# Patient Record
Sex: Female | Born: 1955
Health system: Southern US, Community
[De-identification: ages and names within clinical notes are randomized; demographics above are authoritative.]

## PROBLEM LIST (undated history)

## (undated) DIAGNOSIS — R079 Chest pain, unspecified: Secondary | ICD-10-CM

## (undated) DIAGNOSIS — K219 Gastro-esophageal reflux disease without esophagitis: Secondary | ICD-10-CM

## (undated) DIAGNOSIS — F1910 Other psychoactive substance abuse, uncomplicated: Secondary | ICD-10-CM

## (undated) DIAGNOSIS — G4700 Insomnia, unspecified: Secondary | ICD-10-CM

## (undated) DIAGNOSIS — I10 Essential (primary) hypertension: Secondary | ICD-10-CM

## (undated) DIAGNOSIS — C439 Malignant melanoma of skin, unspecified: Secondary | ICD-10-CM

## (undated) DIAGNOSIS — Z87898 Personal history of other specified conditions: Secondary | ICD-10-CM

## (undated) DIAGNOSIS — Z720 Tobacco use: Secondary | ICD-10-CM

## (undated) DIAGNOSIS — E785 Hyperlipidemia, unspecified: Secondary | ICD-10-CM

## (undated) DIAGNOSIS — T7840XA Allergy, unspecified, initial encounter: Secondary | ICD-10-CM

## (undated) DIAGNOSIS — F1011 Alcohol abuse, in remission: Secondary | ICD-10-CM

## (undated) DIAGNOSIS — F419 Anxiety disorder, unspecified: Secondary | ICD-10-CM

## (undated) DIAGNOSIS — M1990 Unspecified osteoarthritis, unspecified site: Secondary | ICD-10-CM

## (undated) DIAGNOSIS — D72829 Elevated white blood cell count, unspecified: Secondary | ICD-10-CM

## (undated) HISTORY — DX: Personal history of other specified conditions: Z87.898

## (undated) HISTORY — DX: Alcohol abuse, in remission: F10.11

## (undated) HISTORY — PX: DENTAL SURGERY: SHX609

## (undated) HISTORY — DX: Chest pain, unspecified: R07.9

## (undated) HISTORY — DX: Other psychoactive substance abuse, uncomplicated: F19.10

## (undated) HISTORY — DX: Hyperlipidemia, unspecified: E78.5

## (undated) HISTORY — DX: Essential (primary) hypertension: I10

## (undated) HISTORY — DX: Insomnia, unspecified: G47.00

## (undated) HISTORY — DX: Anxiety disorder, unspecified: F41.9

## (undated) HISTORY — DX: Tobacco use: Z72.0

## (undated) HISTORY — DX: Elevated white blood cell count, unspecified: D72.829

## (undated) HISTORY — PX: COLONOSCOPY: SHX174

## (undated) HISTORY — DX: Allergy, unspecified, initial encounter: T78.40XA

## (undated) HISTORY — PX: TONSILLECTOMY: SUR1361

## (undated) HISTORY — DX: Malignant melanoma of skin, unspecified: C43.9

---

## 1997-07-11 DIAGNOSIS — C439 Malignant melanoma of skin, unspecified: Secondary | ICD-10-CM

## 1997-07-11 HISTORY — PX: MELANOMA EXCISION: SHX5266

## 1997-07-11 HISTORY — DX: Malignant melanoma of skin, unspecified: C43.9

## 2000-08-15 ENCOUNTER — Ambulatory Visit (HOSPITAL_COMMUNITY): Admission: RE | Admit: 2000-08-15 | Discharge: 2000-08-15 | Payer: Self-pay | Admitting: Obstetrics and Gynecology

## 2000-08-15 ENCOUNTER — Encounter: Payer: Self-pay | Admitting: Obstetrics and Gynecology

## 2002-05-17 ENCOUNTER — Ambulatory Visit (HOSPITAL_COMMUNITY): Admission: RE | Admit: 2002-05-17 | Discharge: 2002-05-17 | Payer: Self-pay | Admitting: Family Medicine

## 2002-05-17 ENCOUNTER — Encounter: Payer: Self-pay | Admitting: Family Medicine

## 2007-07-12 DIAGNOSIS — F1011 Alcohol abuse, in remission: Secondary | ICD-10-CM

## 2007-07-12 DIAGNOSIS — R079 Chest pain, unspecified: Secondary | ICD-10-CM

## 2007-07-12 HISTORY — DX: Alcohol abuse, in remission: F10.11

## 2007-07-12 HISTORY — DX: Chest pain, unspecified: R07.9

## 2007-11-01 ENCOUNTER — Inpatient Hospital Stay (HOSPITAL_COMMUNITY): Admission: EM | Admit: 2007-11-01 | Discharge: 2007-11-02 | Payer: Self-pay | Admitting: Emergency Medicine

## 2007-11-01 ENCOUNTER — Ambulatory Visit: Payer: Self-pay | Admitting: *Deleted

## 2007-12-14 ENCOUNTER — Encounter: Payer: Self-pay | Admitting: Cardiovascular Disease

## 2010-11-23 NOTE — Discharge Summary (Signed)
[name], Sarah Palmer               ACCOUNT NO.:  [id number]   MEDICAL RECORD NO.:  [id number]          PATIENT TYPE:  INP   LOCATION:  4733                         FACILITY:  [facility]   PHYSICIAN:  Manning Charity, MD     DATE OF BIRTH:  1956/04/04   DATE OF ADMISSION:  11/01/2007  DATE OF DISCHARGE:  11/02/2007                               DISCHARGE SUMMARY   DISCHARGE DIAGNOSES:  1. Atypical chest pain, likely secondary to esophagitis or      gastroesophageal reflux disease.  2. Hypertension.  3. Alcohol abuse.  4. History of malignant melanoma on the leg, removed in 1999.   DISCHARGE MEDICATIONS:  1. Protonix 40 mg by mouth daily.  2. Carafate 1 g twice a day.  3. Hydrochlorothiazide 12.5 mg by mouth daily.  4. Lopressor 25 mg twice a day.  5. Ativan 1 mg p.o. every 8 hours, then 1 mg p.o. every 12 hours, then      1 mg for 1 day, and then stop.  6. Multivitamins.   DISPOSITION ON FOLLOWUP:  1. [name] has an appointment scheduled with Dr. Sunnie Nielsen at      [facility] on November 08, 2007, at 3:15 p.m.      She will need on that visit Bmet  to followup electrolytes.  2. She will need also a referral to a cardiologist for a Myoview      stress test.   HISTORY OF PRESENT ILLNESS:  [name] is a 55 year old with past  medical history of hypertension, tobacco and alcohol abuse, presents  with chest pain.  She related that the pain is on middle chest, not  radiating.  It has been present on and off for 2 months, but it got  severe over the last 7 days.  It is 7/10 in intensity.  No pain with  activity.  No associated  nausea, sweating, vomiting, or dyspnea.  She  related she started drinking alcohol 7 days ago, to help relieve the  pain.  She related that she has been drinking 16 ounce of Vodka daily.  Also having palpitations.  She related mild cough.  No fever or nausea.   PHYSICAL EXAMINATION:  VITAL SIGNS:  Temperature 97.2, blood pressure  162/100, pulse 112, respirations 24, and oxygen saturation 97% on room  air.  GENERAL:  She was alert and awake.  EYES:  PERRLA.  NECK:  Supple.  No JVD.  RESPIRATION:  Clear breath sounds.  No crackles.  No wheezing.  CARDIOVASCULAR:  S1 and S2, tachycardic.  GASTROINTESTINAL:  Bowel sounds soft and positive.  No bright red blood  per rectum.   LABORATORY DATA:  Sodium 144, potassium 3.6, chloride 108, bicarb 21,  BUN 11, creatinine 0.66, and glucose 82.  Hemoglobin 14.3, white blood  cell 13.6, and platelets 336.  Anion gap 15.  Bilirubin 44, alkaline  phosphatase 121, AST 27, ALT 24, protein 6.7, and albumin 3.7, and  calcium 8.6.  Alcohol level 154.  Lipase 14.  D-dimer 1.0.  CT of chest,  no PE.  Chest  x-ray, negative.   PROBLEMS:  1. Chest pain.  Ms. Sherrice Creekmore was admitted to rule out acute      coronary syndrome. She was started on aspirin 325mg , metoprolol and      oxigen. Cardiac enzymes x3 negative.  EKG; no significant changes,      only prolonged QT.  Electrolytes were repleted, magnesium and      potassium.  On the second day of hospitalization, she denied chest      pain.  We started her on Protonix and Carafate, and these helped      with the pain.  2. Hypertension.  She was not taking medications, prior to this      admission.  We started her on Lopressor and hydrochlorothiazide for      blood pressure control.  3. Alcohol abuse.  She has a history of alcohol withdrawal with      seizure.  We started her on the CIWA protocol and Ativan taper.  We      sent her home with Ativan taper, also.  She had counseling      regarding cessation of alcohol abuse.   DISCHARGE LABS AND VITALS:  On the day of discharge, [name]  was in good condition.  She denied chest pain or shortness of breath.  Blood pressure 156/100, pulse 76, respirations 18, oxygen saturation 98%  on room air.  She was alert and awake, in no acute distress.  Pulmonary,  clear breath sounds.  Cardiovascular, S1 and S2.  Labs; white blood cells  12, hemoglobin 12.6, and hematocrit 36.  Sodium 141, potassium 4.0,  chloride 106, bicarb 25, BUN 10, and creatinine 0.7.  Blood culture  negative.  Cholesterol 241, HDL 73, and LDL 144.  TSH 1.180.      Hartley Barefoot, MD  Electronically Signed      Manning Charity, MD  Electronically Signed    BR/MEDQ  D:  11/02/2007  T:  11/03/2007  Job:  [id number]

## 2011-04-05 LAB — URINALYSIS, ROUTINE W REFLEX MICROSCOPIC
Bilirubin Urine: NEGATIVE
Glucose, UA: NEGATIVE
Ketones, ur: >80 — AB
Protein, ur: NEGATIVE
pH: 5.5

## 2011-04-05 LAB — LIPASE, BLOOD: Lipase: 14

## 2011-04-05 LAB — BASIC METABOLIC PANEL
BUN: 10
CO2: 25
Chloride: 105
Potassium: 4

## 2011-04-05 LAB — CBC
HCT: 36.5
HCT: 41.1
Hemoglobin: 14.3
MCHC: 34.7
MCV: 82.9
Platelets: 278
Platelets: 336
RBC: 4.38
RBC: 4.96
RDW: 14.3
WBC: 12.9 — ABNORMAL HIGH
WBC: 13.4 — ABNORMAL HIGH

## 2011-04-05 LAB — CK TOTAL AND CKMB (NOT AT ARMC)
CK, MB: 2.6
Relative Index: INVALID
Total CK: 77

## 2011-04-05 LAB — CARDIAC PANEL(CRET KIN+CKTOT+MB+TROPI)
CK, MB: 1.9
CK, MB: 1.9
Relative Index: 1.9
Relative Index: INVALID
Relative Index: INVALID
Total CK: 79
Total CK: 81
Troponin I: 0.02

## 2011-04-05 LAB — CULTURE, BLOOD (ROUTINE X 2)
Culture: NO GROWTH
Culture: NO GROWTH

## 2011-04-05 LAB — URINE DRUGS OF ABUSE SCREEN W ALC, ROUTINE (REF LAB)
Barbiturate Quant, Ur: NEGATIVE
Benzodiazepines.: NEGATIVE
Cocaine Metabolites: NEGATIVE
Creatinine,U: 120.2
Methadone: NEGATIVE
Phencyclidine (PCP): NEGATIVE

## 2011-04-05 LAB — LIPID PANEL
Cholesterol: 241 — ABNORMAL HIGH
LDL Cholesterol: 144 — ABNORMAL HIGH
VLDL: 24

## 2011-04-05 LAB — COMPREHENSIVE METABOLIC PANEL
AST: 27
BUN: 11
CO2: 21
Calcium: 8.6
Creatinine, Ser: 0.66
GFR calc Af Amer: >60
GFR calc non Af Amer: >60
Glucose, Bld: 82
Total Bilirubin: 0.4

## 2011-04-05 LAB — POCT CARDIAC MARKERS
Myoglobin, poc: 52.2
Operator id: 282201

## 2011-04-05 LAB — STREP PNEUMONIAE ANTIBODY SEROTYPES
Strep pneumo Type 19: 0.14 ug/mL
Strep pneumo Type 4: 0.3 ug/mL
Strep pneumo Type 9: 0.08 ug/mL
Strep pneumoniae Type 18C Abs: 0.05 ug/mL
Strep pneumoniae Type 5 Abs: 1.44 ug/mL
Strep pneumoniae Type 6B Abs: 1.25 ug/mL
Strep pneumoniae Type 9N Abs: 0.06 ug/mL

## 2011-04-05 LAB — URINE MICROSCOPIC-ADD ON

## 2011-04-05 LAB — DIFFERENTIAL
Basophils Absolute: 0.1
Lymphocytes Relative: 31
Lymphs Abs: 4.2 — ABNORMAL HIGH
Neutro Abs: 8.1 — ABNORMAL HIGH
Neutrophils Relative %: 61

## 2011-04-05 LAB — TSH: TSH: 1.18

## 2011-04-05 LAB — D-DIMER, QUANTITATIVE: D-Dimer, Quant: 1.08 — ABNORMAL HIGH

## 2011-04-05 LAB — LEGIONELLA ANTIGEN, URINE: Legionella Antigen, Urine: NEGATIVE

## 2011-04-05 LAB — TROPONIN I: Troponin I: 0.01

## 2012-07-06 ENCOUNTER — Encounter: Payer: Self-pay | Admitting: Cardiology

## 2012-07-06 ENCOUNTER — Encounter: Payer: Self-pay | Admitting: *Deleted

## 2012-07-06 ENCOUNTER — Ambulatory Visit (INDEPENDENT_AMBULATORY_CARE_PROVIDER_SITE_OTHER): Payer: BC Managed Care – PPO | Admitting: Cardiology

## 2012-07-06 VITALS — BP 132/90 | HR 59 | Ht 65.5 in | Wt 227.0 lb

## 2012-07-06 DIAGNOSIS — R0789 Other chest pain: Secondary | ICD-10-CM | POA: Insufficient documentation

## 2012-07-06 DIAGNOSIS — Z85820 Personal history of malignant melanoma of skin: Secondary | ICD-10-CM | POA: Insufficient documentation

## 2012-07-06 DIAGNOSIS — I10 Essential (primary) hypertension: Secondary | ICD-10-CM | POA: Insufficient documentation

## 2012-07-06 DIAGNOSIS — R079 Chest pain, unspecified: Secondary | ICD-10-CM

## 2012-07-06 DIAGNOSIS — E785 Hyperlipidemia, unspecified: Secondary | ICD-10-CM

## 2012-07-06 DIAGNOSIS — F1011 Alcohol abuse, in remission: Secondary | ICD-10-CM

## 2012-07-06 DIAGNOSIS — Z720 Tobacco use: Secondary | ICD-10-CM | POA: Insufficient documentation

## 2012-07-06 DIAGNOSIS — F17200 Nicotine dependence, unspecified, uncomplicated: Secondary | ICD-10-CM

## 2012-07-06 DIAGNOSIS — C439 Malignant melanoma of skin, unspecified: Secondary | ICD-10-CM

## 2012-07-06 NOTE — Assessment & Plan Note (Signed)
In the absence of known vascular disease, pharmacologic therapy is not mandatory.

## 2012-07-06 NOTE — Patient Instructions (Signed)
Your physician recommends that you schedule a follow-up appointment in: As needed  Your physician has requested that you have a stress echocardiogram. For further information please visit [email or web address] Please follow instruction sheet as given.  Dash Diet  Increase exercise after testing

## 2012-07-06 NOTE — Assessment & Plan Note (Signed)
Chest discomfort is atypical, but did have the quality of myocardial ischemia.  Stress testing is reasonable to substantially decrease the likelihood of coronary artery disease.  Reading of CT Scan obtained in 2009 did not mention the appearance of the coronaries or aorta.  Study will be reviewed with the radiologist.

## 2012-07-06 NOTE — Assessment & Plan Note (Signed)
Quit attempt initiated in 06/2012.

## 2012-07-06 NOTE — Assessment & Plan Note (Signed)
Blood pressure is well controlled at this visit.

## 2012-07-06 NOTE — Progress Notes (Signed)
Patient ID: Sarah Palmer, female   DOB: 1955/10/27, 56 y.o.   MRN: [id number]  HPI: Cardiology assessment performed at the kind request of Dr. Collins Scotland for evaluation of chest discomfort.  Ms. Levenhagen has a history of hypertension and hyperlipidemia but has had no known vascular disease.  She underwent a cardiology evaluation in 2009 for palpitations associated with dyspnea and chest discomfort.  Echocardiogram, stress test and CT scan of the chest were negative.  She subsequently has done generally well with good control of hypertension until last week when she experienced persistent left upper chest pressure as well as intermittent sharp and brief chest pains associated with a upper respiratory infection.  She had developed cough with scant sputum production and chest congestion a few days earlier.  With resolution of that problem, she has noted no further chest discomfort.  There was no pleuritic component no chest wall tenderness.  She did not appreciate associated dyspnea or diaphoresis.  Recent chest x-ray and EKG reportedly negative.  Cardiology consultation obtained at patient's request.  Current Outpatient Prescriptions on File Prior to Visit  Medication Sig Dispense Refill  . aspirin 81 MG tablet Take 81 mg by mouth daily.      Marland Kitchen atenolol (TENORMIN) 25 MG tablet Take 25 mg by mouth daily.       . Flaxseed, Linseed, (FLAX SEED OIL PO) Take by mouth.      . hydrochlorothiazide (MICROZIDE) 12.5 MG capsule Take 12.5 mg by mouth daily.      Marland Kitchen omega-3 acid ethyl esters (LOVAZA) 1 G capsule Take 1 g by mouth daily.        Allergies  Allergen Reactions  . Penicillins     Past Medical History  Diagnosis Date  . Hyperlipidemia     lipid profile in 2009:241, 122, 73, 144  . Hypertension   . Chest pain 2009    associated with palpitations; negative chest CT in 2009  . Melanoma 1999    resected from lower extremity  . Tobacco abuse   . History of alcohol abuse 2009    Past Surgical History    Procedure Date  . Melanoma excision 1999    lower extremity    Family History  Problem Relation Age of Onset  . Hypertension      History   Social History  . Marital Status: Single    Spouse Name: N/A    Number of Children: N/A  . Years of Education: N/A   Occupational History  . Not on file.   Social History Main Topics  . Smoking status: Former Smoker    Types: Cigarettes    Quit date: 06/29/2012  . Smokeless tobacco: Not on file  . Alcohol Use: No     Comment: excessive alcohol use in the past noted in 2009  . Drug Use: No  . Sexually Active: Not on file   Other Topics Concern  . Not on file   Social History Narrative  . No narrative on file   ROS:  Mild gastroesophageal reflux disease symptoms.  All other systems reviewed and are negative.  PHYSICAL EXAM: BP 132/90  Pulse 59  Ht 5' 5.5" (1.664 m)  Wt 102.967 kg (227 lb)  BMI 37.20 kg/m2  General-Well-developed; no acute distress Body Habitus-substantially overweight HEENT-Drew/AT; PERRL; EOM intact; conjunctiva and lids nl Neck-No JVD; no carotid bruits Endocrine-No thyromegaly Lungs-Clear lung fields; resonant percussion; normal I-to-E ratio Cardiovascular- normal PMI; normal S1 and S2; S4 present Abdomen-BS normal; soft and  non-tender without masses or organomegaly Musculoskeletal-No deformities, cyanosis or clubbing Neurologic-Nl cranial nerves; symmetric strength and tone Skin- Warm, no significant lesions Extremities-Nl distal pulses; no edema  EKG:  Tracing performed 07/02/12 obtained and reviewed: Normal sinus rhythm, within normal limits.   ASSESSMENT AND PLAN:  [place] Bing, MD 07/06/2012 12:28 PM

## 2012-07-06 NOTE — Progress Notes (Deleted)
Name: Sarah Palmer    DOB: [birth year]  Age: 56 y.o.  MR#: [id number]       PCP:  Herb Grays, MD      Insurance: @PAYORNAME @   CC:   No chief complaint on file.  MEDICATION LIST TIGHTNESS AT REST AFTER MEALS NEW ONSET REFLUX  VS BP 132/90  Pulse 59  Ht 5' 5.5" (1.664 m)  Wt 227 lb (102.967 kg)  BMI 37.20 kg/m2  Weights Current Weight  07/06/12 227 lb (102.967 kg)    Blood Pressure  BP Readings from Last 3 Encounters:  07/06/12 132/90     Admit date:  (Not on file) Last encounter with RMR:  Visit date not found   Allergy Allergies  Allergen Reactions  . Penicillins     Current Outpatient Prescriptions  Medication Sig Dispense Refill  . aspirin 81 MG tablet Take 81 mg by mouth daily.      Marland Kitchen atenolol (TENORMIN) 25 MG tablet Take 25 mg by mouth daily.       . Flaxseed, Linseed, (FLAX SEED OIL PO) Take by mouth.      . hydrochlorothiazide (MICROZIDE) 12.5 MG capsule Take 12.5 mg by mouth daily.      Marland Kitchen omega-3 acid ethyl esters (LOVAZA) 1 G capsule Take 1 g by mouth daily.         Discontinued Meds:    Medications Discontinued During This Encounter  Medication Reason  . metoprolol tartrate (LOPRESSOR) 25 MG tablet Completed Course  . sucralfate (CARAFATE) 1 G tablet Completed Course    Patient Active Problem List  Diagnosis  . Hyperlipidemia  . Hypertension  . Chest pain  . Melanoma  . Tobacco abuse  . History of alcohol abuse    LABS No visits with results within 3 Month(s) from this visit. Latest known visit with results is:  Admission on 11/01/2007, Discharged on 11/02/2007  Component Date Value  . WBC 11/01/2007 13.4*  . RBC 11/01/2007 4.96   . Hemoglobin 11/01/2007 14.3   . HCT 11/01/2007 41.1   . MCV 11/01/2007 82.9   . MCHC 11/01/2007 34.7   . RDW 11/01/2007 14.3   . Platelets 11/01/2007 336   . Total CK 11/01/2007 77   . CK, MB 11/01/2007 2.6   . Relative Index 11/01/2007                     Value:RELATIVE INDEX IS INVALID            WHEN CK < 100 U/L                                 . Sodium 11/01/2007 144   . Potassium 11/01/2007 3.6   . Chloride 11/01/2007 108   . CO2 11/01/2007 21   . Glucose, Bld 11/01/2007 82   . BUN 11/01/2007 11   . Creatinine, Ser 11/01/2007 0.66   . Calcium 11/01/2007 8.6   . Total Protein 11/01/2007 6.7   . Albumin 11/01/2007 3.7   . AST 11/01/2007 27   . ALT 11/01/2007 24   . Alkaline Phosphatase 11/01/2007 121*  . Total Bilirubin 11/01/2007 0.4   . GFR calc non Af Amer 11/01/2007 >60   . GFR calc Af Amer 11/01/2007                     Value:>60  The eGFR has been calculated                         using the MDRD equation.                         This calculation has not been                         validated in all clinical  . Neutrophils Relative 11/01/2007 61   . Neutro Abs 11/01/2007 8.1*  . Lymphocytes Relative 11/01/2007 31   . Lymphs Abs 11/01/2007 4.2*  . Monocytes Relative 11/01/2007 6   . Monocytes Absolute 11/01/2007 0.8   . Eosinophils Relative 11/01/2007 1   . Eosinophils Absolute 11/01/2007 0.2   . Basophils Relative 11/01/2007 1   . Basophils Absolute 11/01/2007 0.1   . D-Dimer, Quant 11/01/2007 *                   Value:1.08                                AT THE INHOUSE ESTABLISHED CUTOFF                         VALUE OF 0.48 ug/mL FEU,                         THIS ASSAY HAS BEEN DOCUMENTED                         IN THE LITERATURE TO HAVE  . Lipase 11/01/2007 14   . Troponin I 11/01/2007                     Value:0.01                                NO INDICATION OF                         MYOCARDIAL INJURY.  Marland Kitchen Operator id 11/01/2007 [id number]   . Myoglobin, poc 11/01/2007 52.2   . CKMB, poc 11/01/2007 2.5   . Troponin i, poc 11/01/2007 0.10*  . Alcohol, Ethyl (B) 11/01/2007 *                   Value:154                                LOWEST DETECTABLE LIMIT FOR                         SERUM ALCOHOL IS 11 mg/dL                          FOR MEDICAL PURPOSES ONLY  . Fecal Occult Bld 11/01/2007 NEGATIVE   . Total CK 11/01/2007 79   . CK, MB 11/01/2007 1.9   . Troponin I 11/01/2007                     Value:0.02  NO INDICATION OF                         MYOCARDIAL INJURY.  . Relative Index 11/01/2007                     Value:RELATIVE INDEX IS INVALID                         WHEN CK < 100 U/L                                 . Magnesium 11/01/2007 1.6   . Strep pneumoniae Type 3 * 11/01/2007 0.09   . Strep pneumoniae Type 41F* 11/01/2007 0.16   . Strep pneumoniae Type 9N* 11/01/2007 0.06   . Strep pneumoniae Type 14* 11/01/2007 0.48   . Strep pneumoniae Type 1 * 11/01/2007 0.02   . Strep pneumo Type 4 11/01/2007 0.30   . Strep pneumoniae Type 5 * 11/01/2007 1.44   . Strep pneumoniae Type 6B* 11/01/2007 1.25   . Strep pneumoniae Type 8 * 11/01/2007 0.04   . Strep pneumo Type 9 11/01/2007 0.08   . Strep pneumo Type 19 11/01/2007 0.14   . Strep pneumoniae Type 23* 11/01/2007 0.01   . Strep pneumo Type 12 11/01/2007 0.43   . Strep pneumoniae Type 18* 11/01/2007 0.05   . Strep pneumoniae Interpr* 11/01/2007                     Value:See Note                         (NOTE) INTERPRETATION: Pneumococcal Antibodies, IgG Includes serotypes 1, 3, 4*, 5, 6B*, 41F, 8, 9N, 9V*, 3F, 14*, 18C*, 44F*, 61F*  All serotypes tested are present in the 23-valent pure polysaccharide pneumococcal vaccine. The serotypes marked with an                          asterisk are contained in the conjugated pneumococcal vaccine.  This assay is designed to use both "pre" and "post" samples to assess immune responsiveness to vaccination. This test is not designed to determine protection to Streptococcus pneumoniae                          based on a single sample.  Long-term protection is generally thought to be associated with a one month post-vaccine response of 1 ug/mL in children and adults.  Responder  status is determined according to the ratio of postvaccination to prevaccination                          concentration of pneumococcal IgG antibody as follows: A ratio of less than twofold is considered a non-responder. A ratio of two to fourfold is a weak responder. A ratio of fourfold or greater is a good responder.  A response to greater than 50% of the                          antigens contained in the vaccination is indicative of a normal response. Performed by [facility], [address], [place] [address] [phone] [email or web address], Edward R. Ashwood, MD - Lab. Director  .  Collection Date: 11/01/2007                     Value:11/01/2007                         CORRECTED ON 04/30 AT 1221: PREVIOUSLY REPORTED AS 11/01/07  . TSH 11/01/2007 1.180 ***Test methodology is 3rd generation TSH***   . Specimen Description 11/01/2007 URINE, RANDOM   . Special Requests 11/01/2007 NONE   . Legionella Antigen, Urine 11/01/2007 Negative for Legionella pneumophilia serogroup 1   . Report Status 11/01/2007 11/02/2007 FINAL   . Ethanol, Ur-Confirmation 11/01/2007                     Value:REPORT                         (NOTE) Alcohol, Ethyl, Qn         None Detected          mg/dL     Acetone detected Reference range: None detected Detection limit: 20 mg/dL  . Preg Test, Ur 11/01/2007                     Value:NEGATIVE                                THE SENSITIVITY OF THIS                         METHODOLOGY IS >24 mIU/mL  . Amphetamine Screen, Ur 11/01/2007 NEGATIVE   . Marijuana Metabolite 11/01/2007 NEGATIVE   . Barbiturate Quant, Ur 11/01/2007 NEGATIVE   . Methadone 11/01/2007 NEGATIVE   . Propoxyphene 11/01/2007 NEGATIVE   . Benzodiazepines. 11/01/2007 NEGATIVE   . Phencyclidine (PCP) 11/01/2007 NEGATIVE   . Cocaine Metabolites 11/01/2007 NEGATIVE   . Opiate Screen, Urine 11/01/2007 NEGATIVE   . Ethyl Alcohol 11/01/2007 11 Sent for confirmatory testing*  . Creatinine,U 11/01/2007                      Value:120.2                         (NOTE)  Cutoff Values for Urine Drug Screen:        Drug Class           Cutoff (ng/mL)        Amphetamines            1000        Barbiturates             200        Cocaine Metabolites      300        Benzodiazepines          200        Methadone                                         300        Opiates                 2000        Phencyclidine  25        Propoxyphene             300        Marijuana Metabolites     50  For medical purposes only.  . Color, Urine 11/01/2007 YELLOW   . APPearance 11/01/2007 CLOUDY*  . Specific Gravity, Urine 11/01/2007 1.046*  . pH 11/01/2007 5.5   . Glucose, UA 11/01/2007 NEGATIVE   . Hgb urine dipstick 11/01/2007 SMALL*  . Bilirubin Urine 11/01/2007 NEGATIVE   . Ketones, ur 11/01/2007 >80*  . Protein, ur 11/01/2007 NEGATIVE   . Urobilinogen, UA 11/01/2007 0.2   . Nitrite 11/01/2007 NEGATIVE   . Leukocytes, UA 11/01/2007 NEGATIVE   . Squamous Epithelial / LPF 11/01/2007 FEW*  . WBC, UA 11/01/2007 3-6   . RBC / HPF 11/01/2007 3-6   . Bacteria, UA 11/01/2007 FEW*  . Specimen Description 11/01/2007 BLOOD RIGHT HAND   . Special Requests 11/01/2007 BOTTLES DRAWN AEROBIC AND ANAEROBIC 5CC AER 2CC ANA   . Culture 11/01/2007 NO GROWTH 5 DAYS   . Report Status 11/01/2007 11/07/2007 FINAL   . Specimen Description 11/01/2007 BLOOD RIGHT HAND   . Special Requests 11/01/2007 BOTTLES DRAWN AEROBIC ONLY 5CC   . Culture 11/01/2007 NO GROWTH 5 DAYS   . Report Status 11/01/2007 11/07/2007 FINAL   . Total CK 11/01/2007 81   . CK, MB 11/01/2007 1.9   . Troponin I 11/01/2007                     Value:0.02                                NO INDICATION OF                         MYOCARDIAL INJURY.  . Relative Index 11/01/2007                     Value:RELATIVE INDEX IS INVALID                         WHEN CK < 100 U/L                                 . Cholesterol 11/02/2007 *                   Value:241                                  ATP III CLASSIFICATION:                          <200     mg/dL   Desirable                          200-239  mg/dL   Borderline High                          >=240    mg/dL   High  . Triglycerides 11/02/2007 122   . HDL 11/02/2007 73   . Total CHOL/HDL Ratio 11/02/2007 3.3   . VLDL 11/02/2007 24   .  LDL Cholesterol 11/02/2007 *                   Value:144                                Total Cholesterol/HDL:CHD Risk                         Coronary Heart Disease Risk Table                                             Men   Women                          1/2 Average Risk   3.4   3.3  . Sodium 11/02/2007 141   . Potassium 11/02/2007 4.0 SLIGHT HEMOLYSIS   . Chloride 11/02/2007 105   . CO2 11/02/2007 25   . Glucose, Bld 11/02/2007 97   . BUN 11/02/2007 10   . Creatinine, Ser 11/02/2007 0.76   . Calcium 11/02/2007 8.5   . GFR calc non Af Amer 11/02/2007 >60   . GFR calc Af Amer 11/02/2007                     Value:>60                                The eGFR has been calculated                         using the MDRD equation.                         This calculation has not been                         validated in all clinical  . Total CK 11/02/2007 121   . CK, MB 11/02/2007 2.3   . Troponin I 11/02/2007                     Value:0.02                                NO INDICATION OF                         MYOCARDIAL INJURY.  . Relative Index 11/02/2007 1.9   . WBC 11/02/2007 12.9*  . RBC 11/02/2007 4.38   . Hemoglobin 11/02/2007 12.6   . HCT 11/02/2007 36.5   . MCV 11/02/2007 83.3   . MCHC 11/02/2007 34.6   . RDW 11/02/2007 14.2   . Platelets 11/02/2007 278      Results for this Opt Visit:     Results for orders placed during the hospital encounter of 11/01/07  CBC      Component Value Range   WBC 13.4 (*)    RBC 4.96     Hemoglobin 14.3     HCT 41.1     MCV 82.9     MCHC 34.7  RDW 14.3     Platelets 336    CK TOTAL AND CKMB       Component Value Range   Total CK 77     CK, MB 2.6     Relative Index       Value: RELATIVE INDEX IS INVALID     WHEN CK < 100 U/L             COMPREHENSIVE METABOLIC PANEL      Component Value Range   Sodium 144     Potassium 3.6     Chloride 108     CO2 21     Glucose, Bld 82     BUN 11     Creatinine, Ser 0.66     Calcium 8.6     Total Protein 6.7     Albumin 3.7     AST 27     ALT 24     Alkaline Phosphatase 121 (*)    Total Bilirubin 0.4     GFR calc non Af Amer >60     GFR calc Af Amer       Value: >60            The eGFR has been calculated     using the MDRD equation.     This calculation has not been     validated in all clinical  DIFFERENTIAL      Component Value Range   Neutrophils Relative 61     Neutro Abs 8.1 (*)    Lymphocytes Relative 31     Lymphs Abs 4.2 (*)    Monocytes Relative 6     Monocytes Absolute 0.8     Eosinophils Relative 1     Eosinophils Absolute 0.2     Basophils Relative 1     Basophils Absolute 0.1    D-DIMER, QUANTITATIVE      Component Value Range   D-Dimer, Quant   (*)    Value: 1.08            AT THE INHOUSE ESTABLISHED CUTOFF     VALUE OF 0.48 ug/mL FEU,     THIS ASSAY HAS BEEN DOCUMENTED     IN THE LITERATURE TO HAVE  LIPASE, BLOOD      Component Value Range   Lipase 14    TROPONIN I      Component Value Range   Troponin I       Value: 0.01            NO INDICATION OF     MYOCARDIAL INJURY.  POCT CARDIAC MARKERS      Component Value Range   Operator id [phone]     Myoglobin, poc 52.2     CKMB, poc 2.5     Troponin i, poc 0.10 (*)   ETHANOL      Component Value Range   Alcohol, Ethyl (B)   (*)    Value: 154            LOWEST DETECTABLE LIMIT FOR     SERUM ALCOHOL IS 11 mg/dL     FOR MEDICAL PURPOSES ONLY  OCCULT BLOOD X 1 CARD TO LAB, STOOL      Component Value Range   Fecal Occult Bld NEGATIVE    CARDIAC PANEL(CRET KIN+CKTOT+MB+TROPI)      Component Value Range   Total CK 79     CK, MB 1.9      Troponin I  Value: 0.02            NO INDICATION OF     MYOCARDIAL INJURY.   Relative Index       Value: RELATIVE INDEX IS INVALID     WHEN CK < 100 U/L             MAGNESIUM      Component Value Range   Magnesium 1.6    STREP PNEUMONIAE ANTIBODY SEROTYPES      Component Value Range   Strep pneumoniae Type 3 Abs 0.09     Strep pneumoniae Type 20F Abs 0.16     Strep pneumoniae Type 9N Abs 0.06     Strep pneumoniae Type 14 Abs 0.48     Strep pneumoniae Type 1 Abs 0.02     Strep pneumo Type 4 0.30     Strep pneumoniae Type 5 Abs 1.44     Strep pneumoniae Type 6B Abs 1.25     Strep pneumoniae Type 8 Abs 0.04     Strep pneumo Type 9 0.08     Strep pneumo Type 19 0.14     Strep pneumoniae Type 69F Abs 0.01     Strep pneumo Type 12 0.43     Strep pneumoniae Type 18C Abs 0.05     Strep pneumoniae Interpretation       Value: See Note     (NOTE) INTERPRETATION: Pneumococcal Antibodies, IgG Includes serotypes 1, 3, 4*, 5, 6B*, 20F, 8, 9N, 9V*, 56F, 14*, 18C*, 58F*, 69F*  All serotypes tested are present in the 23-valent pure polysaccharide pneumococcal vaccine. The serotypes marked with an      asterisk are contained in the conjugated pneumococcal vaccine.  This assay is designed to use both "pre" and "post" samples to assess immune responsiveness to vaccination. This test is not designed to determine protection to Streptococcus pneumoniae      based on a single sample.  Long-term protection is generally thought to be associated with a one month post-vaccine response of 1 ug/mL in children and adults.  Responder status is determined according to the ratio of postvaccination to prevaccination      concentration of pneumococcal IgG antibody as follows: A ratio of less than twofold is considered a non-responder. A ratio of two to fourfold is a weak responder. A ratio of fourfold or greater is a good responder.  A response to greater than 50% of the      antigens contained in the vaccination is  indicative of a normal response. Performed by [facility], [address], [place] [address] [phone] [email or web address], Edward R. Ashwood, MD - Lab. Director   Collection Date:       Value: 11/01/2007     CORRECTED ON 04/30 AT 1221: PREVIOUSLY REPORTED AS 11/01/07  TSH      Component Value Range   TSH 1.180 ***Test methodology is 3rd generation TSH***    LEGIONELLA ANTIGEN, URINE      Component Value Range   Specimen Description URINE, RANDOM     Special Requests NONE     Legionella Antigen, Urine Negative for Legionella pneumophilia serogroup 1     Report Status 11/02/2007 FINAL    ETHANOL, URINE      Component Value Range   Ethanol, Ur-Confirmation       Value: REPORT     (NOTE) Alcohol, Ethyl, Qn         None Detected          mg/dL  Acetone detected Reference range: None detected Detection limit: 20 mg/dL  PREGNANCY, URINE      Component Value Range   Preg Test, Ur       Value: NEGATIVE            THE SENSITIVITY OF THIS     METHODOLOGY IS >24 mIU/mL  DRUGS OF ABUSE SCREEN W ALC, ROUTINE URINE      Component Value Range   Amphetamine Screen, Ur NEGATIVE     Marijuana Metabolite NEGATIVE     Barbiturate Quant, Ur NEGATIVE     Methadone NEGATIVE     Propoxyphene NEGATIVE     Benzodiazepines. NEGATIVE     Phencyclidine (PCP) NEGATIVE     Cocaine Metabolites NEGATIVE     Opiate Screen, Urine NEGATIVE     Ethyl Alcohol 11 Sent for confirmatory testing (*)    Creatinine,U       Value: 120.2     (NOTE)  Cutoff Values for Urine Drug Screen:        Drug Class           Cutoff (ng/mL)        Amphetamines            1000        Barbiturates             200        Cocaine Metabolites      300        Benzodiazepines          200        Methadone                     300        Opiates                 2000        Phencyclidine             25        Propoxyphene             300        Marijuana Metabolites     50  For medical purposes only.  URINALYSIS, ROUTINE W REFLEX MICROSCOPIC       Component Value Range   Color, Urine YELLOW     APPearance CLOUDY (*)    Specific Gravity, Urine 1.046 (*)    pH 5.5     Glucose, UA NEGATIVE     Hgb urine dipstick SMALL (*)    Bilirubin Urine NEGATIVE     Ketones, ur >80 (*)    Protein, ur NEGATIVE     Urobilinogen, UA 0.2     Nitrite NEGATIVE     Leukocytes, UA NEGATIVE    URINE MICROSCOPIC-ADD ON      Component Value Range   Squamous Epithelial / LPF FEW (*)    WBC, UA 3-6     RBC / HPF 3-6     Bacteria, UA FEW (*)   CULTURE, BLOOD (ROUTINE X 2)      Component Value Range   Specimen Description BLOOD RIGHT HAND     Special Requests       Value: BOTTLES DRAWN AEROBIC AND ANAEROBIC 5CC AER 2CC ANA   Culture NO GROWTH 5 DAYS     Report Status 11/07/2007 FINAL    CULTURE, BLOOD (ROUTINE X 2)      Component Value Range   Specimen Description  BLOOD RIGHT HAND     Special Requests BOTTLES DRAWN AEROBIC ONLY 5CC     Culture NO GROWTH 5 DAYS     Report Status 11/07/2007 FINAL    CARDIAC PANEL(CRET KIN+CKTOT+MB+TROPI)      Component Value Range   Total CK 81     CK, MB 1.9     Troponin I       Value: 0.02            NO INDICATION OF     MYOCARDIAL INJURY.   Relative Index       Value: RELATIVE INDEX IS INVALID     WHEN CK < 100 U/L             LIPID PANEL      Component Value Range   Cholesterol   (*)    Value: 241            ATP III CLASSIFICATION:      <200     mg/dL   Desirable      161-096  mg/dL   Borderline High      >=240    mg/dL   High   Triglycerides 122     HDL 73     Total CHOL/HDL Ratio 3.3     VLDL 24     LDL Cholesterol   (*)    Value: 144            Total Cholesterol/HDL:CHD Risk     Coronary Heart Disease Risk Table                         Men   Women      1/2 Average Risk   3.4   3.3  BASIC METABOLIC PANEL      Component Value Range   Sodium 141     Potassium 4.0 SLIGHT HEMOLYSIS     Chloride 105     CO2 25     Glucose, Bld 97     BUN 10     Creatinine, Ser 0.76     Calcium 8.5      GFR calc non Af Amer >60     GFR calc Af Amer       Value: >60            The eGFR has been calculated     using the MDRD equation.     This calculation has not been     validated in all clinical  CARDIAC PANEL(CRET KIN+CKTOT+MB+TROPI)      Component Value Range   Total CK 121     CK, MB 2.3     Troponin I       Value: 0.02            NO INDICATION OF     MYOCARDIAL INJURY.   Relative Index 1.9    CBC      Component Value Range   WBC 12.9 (*)    RBC 4.38     Hemoglobin 12.6     HCT 36.5     MCV 83.3     MCHC 34.6     RDW 14.2     Platelets 278      EKG No orders found for this or any previous visit.   Prior Assessment and Plan Problem List as of 07/06/2012            Cardiology Problems   Hyperlipidemia   Hypertension  Other   Chest pain   Melanoma   Tobacco abuse   History of alcohol abuse       Imaging: No results found.   FRS Calculation: Score not calculated. Missing: Total Cholesterol

## 2012-07-17 ENCOUNTER — Other Ambulatory Visit (HOSPITAL_COMMUNITY): Payer: BC Managed Care – PPO

## 2012-07-23 ENCOUNTER — Encounter (HOSPITAL_COMMUNITY): Payer: Self-pay

## 2012-07-23 ENCOUNTER — Emergency Department (HOSPITAL_COMMUNITY): Payer: BC Managed Care – PPO

## 2012-07-23 ENCOUNTER — Other Ambulatory Visit: Payer: Self-pay

## 2012-07-23 ENCOUNTER — Emergency Department (HOSPITAL_COMMUNITY)
Admission: EM | Admit: 2012-07-23 | Discharge: 2012-07-23 | Disposition: A | Discharge status: Home / Self Care | Payer: BC Managed Care – PPO | Attending: Emergency Medicine | Admitting: Emergency Medicine

## 2012-07-23 ENCOUNTER — Encounter: Payer: Self-pay | Admitting: Cardiology

## 2012-07-23 DIAGNOSIS — E785 Hyperlipidemia, unspecified: Secondary | ICD-10-CM | POA: Insufficient documentation

## 2012-07-23 DIAGNOSIS — Z85820 Personal history of malignant melanoma of skin: Secondary | ICD-10-CM | POA: Insufficient documentation

## 2012-07-23 DIAGNOSIS — Z79899 Other long term (current) drug therapy: Secondary | ICD-10-CM | POA: Insufficient documentation

## 2012-07-23 DIAGNOSIS — I10 Essential (primary) hypertension: Secondary | ICD-10-CM

## 2012-07-23 DIAGNOSIS — Z7982 Long term (current) use of aspirin: Secondary | ICD-10-CM | POA: Insufficient documentation

## 2012-07-23 DIAGNOSIS — R0789 Other chest pain: Secondary | ICD-10-CM | POA: Insufficient documentation

## 2012-07-23 DIAGNOSIS — F17200 Nicotine dependence, unspecified, uncomplicated: Secondary | ICD-10-CM | POA: Insufficient documentation

## 2012-07-23 DIAGNOSIS — R079 Chest pain, unspecified: Secondary | ICD-10-CM

## 2012-07-23 DIAGNOSIS — R42 Dizziness and giddiness: Secondary | ICD-10-CM | POA: Insufficient documentation

## 2012-07-23 DIAGNOSIS — F1010 Alcohol abuse, uncomplicated: Secondary | ICD-10-CM | POA: Insufficient documentation

## 2012-07-23 NOTE — ED Provider Notes (Signed)
Medical screening examination/treatment/procedure(s) were conducted as a shared visit with non-physician practitioner(s) and myself.  I personally evaluated the patient during the encounter  Pt well appearing, her CP is very atypical, doubt ACS/PE/Dissection at this time.  She has f/u arranged already this week  Joya Gaskins, MD 07/23/12 [phone]

## 2012-07-23 NOTE — ED Notes (Signed)
Pt went to [place] this morning to see if she could be eval. Was brought to the ED via wheelchair bu nurse. Pt states she has been having chest pain for a month. States she also gets dizzy when she eats. Pt anxious

## 2012-07-23 NOTE — ED Provider Notes (Signed)
History     CSN: [id number]  Arrival date & time 07/23/12  4098   First MD Initiated Contact with Patient 07/23/12 [phone]      Chief Complaint  Patient presents with  . Chest Pain    (Consider location/radiation/quality/duration/timing/severity/associated sxs/prior treatment) HPI Comments: Sarah Palmer is a 57 y.o. Female presenting with intermittent episodes of sharp fleeting chest pain sometimes right-sided and sometimes left for the past month.  These episodes are not associated with exertion, nausea, emesis, dizziness or shortness of breath.  She had an episode when she woke at 2 AM this morning lasting 1 minute and another this morning at work also lasting a minute.  [place] as she has a scheduled cardiac stress test there in 4 days which was arranged by her PCP.  She was not seen by her provider, but was transferred here for further evaluation.  She also mentions feeling lightheaded while she was eating her dinner yesterday evening which was not accompanied by chest pain or pressure.  She's had no recent illnesses.  She denies palpitations, peripheral edema or other symptoms.  She has had increased anxiety for the past month with several life changes including a new job.  She quit smoking one week ago.  She is referred for hypertension and hyperlipidemia.  There is no family history of cardiac disease.  Her last stress test approximately 4 years ago was normal.  She is currently symptom-free.     The history is provided by the patient.    Past Medical History  Diagnosis Date  . Hyperlipidemia     lipid profile in 2009:241, 122, 73, 144  . Hypertension   . Chest pain 2009    associated with palpitations; negative chest CT in 2009  . Melanoma 1999    resected from lower extremity  . Tobacco abuse   . History of alcohol abuse 2009    Past Surgical History  Procedure Date  . Melanoma excision 1999    lower extremity  . Colonoscopy never     Family History  Problem Relation Age of Onset  . Hypertension    . COPD Father     History  Substance Use Topics  . Smoking status: Current Every Day Smoker -- 0.4 packs/day for 40 years    Types: Cigarettes    Last Attempt to Quit: 06/29/2012  . Smokeless tobacco: Former Neurosurgeon    Quit date: 06/24/2012     Comment: first quit attempt in 06/2012  . Alcohol Use: No     Comment: excessive alcohol use in the past noted in 2009    OB History    Grav Para Term Preterm Abortions TAB SAB Ect Mult Living                  Review of Systems  Constitutional: Negative for fever.  HENT: Negative for congestion, sore throat and neck pain.   Eyes: Negative.   Respiratory: Negative for chest tightness and shortness of breath.   Cardiovascular: Positive for chest pain. Negative for palpitations and leg swelling.  Gastrointestinal: Negative for nausea, vomiting and abdominal pain.  Genitourinary: Negative.   Musculoskeletal: Negative for joint swelling and arthralgias.  Skin: Negative.  Negative for rash and wound.  Neurological: Positive for light-headedness. Negative for dizziness, weakness, numbness and headaches.  Hematological: Negative.   Psychiatric/Behavioral: Negative.     Allergies  Penicillins  Home Medications   Current Outpatient Rx  Name  Route  Sig  Dispense  Refill  . ASPIRIN 81 MG PO TABS   Oral   Take 81 mg by mouth daily.         . ATENOLOL 25 MG PO TABS   Oral   Take 25 mg by mouth daily.          Marland Kitchen FLAX SEED OIL PO   Oral   Take 1 tablet by mouth daily.          Marland Kitchen HYDROCHLOROTHIAZIDE 12.5 MG PO CAPS   Oral   Take 12.5 mg by mouth daily.         . OMEGA-3-ACID ETHYL ESTERS 1 G PO CAPS   Oral   Take 1 g by mouth daily.            BP 182/93  Pulse 81  Temp 97.9 F (36.6 C) (Oral)  Resp 22  Ht 5\' 6"  (1.676 m)  Wt 225 lb (102.059 kg)  BMI 36.32 kg/m2  SpO2 98%  Physical Exam  Nursing note and vitals reviewed. Constitutional:  She appears well-developed and well-nourished.  HENT:  Head: Normocephalic and atraumatic.  Eyes: Conjunctivae normal are normal.  Neck: Normal range of motion.  Cardiovascular: Normal rate, regular rhythm, normal heart sounds and intact distal pulses.   Pulmonary/Chest: Effort normal and breath sounds normal. She has no wheezes.  Abdominal: Soft. Bowel sounds are normal. There is no tenderness.  Musculoskeletal: Normal range of motion.  Neurological: She is alert.  Skin: Skin is warm and dry.  Psychiatric: She has a normal mood and affect.    ED Course  Procedures (including critical care time)  Labs Reviewed - No data to display Dg Chest 2 View  07/23/2012  *RADIOLOGY REPORT*  Clinical Data: Chest pain, smoker, hypertension  CHEST - 2 VIEW  Comparison: 11/01/2007  Findings: Upper-normal size of cardiac silhouette. Calcified tortuous aorta. Pulmonary vascularity normal. Lungs clear. No pleural effusion or pneumothorax. No acute osseous findings.  IMPRESSION: No acute abnormalities.   Original Report Authenticated By: Ulyses Southward, M.D.      1. Chest pain   2. HTN (hypertension)      Date: 07/23/2012  Rate: 78  Rhythm: normal sinus rhythm  QRS Axis: normal  Intervals: QT prolonged  ST/T Wave abnormalities: normal  Conduction Disutrbances:none  Narrative Interpretation:   Old EKG Reviewed: unchanged    MDM  Pt with atypical chest pain, fleeting with normal exam and asymptomatic.  Is already scheduled for routine exercise stress test in 3 days.  Pt to f/u with [facility] - Dr Dietrich Pates, pcp ,  Also advised to return here for recheck for any worsened sx.  Pt was seen by Dr. Bebe Shaggy prior to dc home.        Burgess Amor, PA 07/23/12 1010  Burgess Amor, [place] 07/23/12 1012

## 2012-07-26 ENCOUNTER — Ambulatory Visit (HOSPITAL_COMMUNITY)
Admission: RE | Admit: 2012-07-26 | Discharge: 2012-07-26 | Disposition: A | Discharge status: Home / Self Care | Payer: BC Managed Care – PPO | Source: Ambulatory Visit | Attending: Cardiology | Admitting: Cardiology

## 2012-07-26 ENCOUNTER — Encounter (HOSPITAL_COMMUNITY): Payer: Self-pay | Admitting: Cardiology

## 2012-07-26 DIAGNOSIS — R079 Chest pain, unspecified: Secondary | ICD-10-CM

## 2012-07-26 DIAGNOSIS — F17200 Nicotine dependence, unspecified, uncomplicated: Secondary | ICD-10-CM | POA: Insufficient documentation

## 2012-07-26 DIAGNOSIS — I10 Essential (primary) hypertension: Secondary | ICD-10-CM | POA: Insufficient documentation

## 2012-07-26 DIAGNOSIS — R072 Precordial pain: Secondary | ICD-10-CM

## 2012-07-26 NOTE — Progress Notes (Signed)
Stress Lab Nurses Notes - [name]  [name] 07/26/2012  Reason for doing test: Chest Pain  Type of test: Stress Echo  Nurse performing test: Marlena Clipper, RN  Nuclear Medicine Tech: Not Applicable  Echo Tech: Karrie Doffing  MD performing test: R. Rothbart  Family MD:   Test explained and consent signed: yes  IV started: No IV started  Symptoms: legs tired and SOB  Treatment/Intervention: None  Reason test stopped: fatigue  After recovery IV was: no iv started  Patient to return to Nuc. Med at :N/A  Patient discharged: Home  Patient's Condition upon discharge was: stable  Comments: Patient walked for 3.01 min. Her resting HR was 84 and resting BP was 123/95, Her peak HR was 139 and her peak BP was 147/100. Symptoms resolved in recovery.  Sarah Palmer

## 2012-07-26 NOTE — Progress Notes (Signed)
*  PRELIMINARY RESULTS* Echocardiogram 2D Echocardiogram has been performed.  Sarah Palmer 07/26/2012, 10:36 AM

## 2012-08-01 ENCOUNTER — Encounter: Payer: Self-pay | Admitting: *Deleted

## 2013-09-25 ENCOUNTER — Emergency Department (HOSPITAL_COMMUNITY)
Admission: EM | Admit: 2013-09-25 | Discharge: 2013-09-25 | Disposition: A | Discharge status: Home / Self Care | Payer: BC Managed Care – PPO | Attending: Emergency Medicine | Admitting: Emergency Medicine

## 2013-09-25 ENCOUNTER — Emergency Department (HOSPITAL_COMMUNITY): Payer: BC Managed Care – PPO

## 2013-09-25 ENCOUNTER — Encounter (HOSPITAL_COMMUNITY): Payer: Self-pay | Admitting: Emergency Medicine

## 2013-09-25 DIAGNOSIS — Z79899 Other long term (current) drug therapy: Secondary | ICD-10-CM | POA: Insufficient documentation

## 2013-09-25 DIAGNOSIS — R5383 Other fatigue: Secondary | ICD-10-CM

## 2013-09-25 DIAGNOSIS — J209 Acute bronchitis, unspecified: Secondary | ICD-10-CM | POA: Insufficient documentation

## 2013-09-25 DIAGNOSIS — Z87891 Personal history of nicotine dependence: Secondary | ICD-10-CM | POA: Insufficient documentation

## 2013-09-25 DIAGNOSIS — R5381 Other malaise: Secondary | ICD-10-CM | POA: Insufficient documentation

## 2013-09-25 DIAGNOSIS — Z880 Allergy status to penicillin: Secondary | ICD-10-CM | POA: Insufficient documentation

## 2013-09-25 DIAGNOSIS — I10 Essential (primary) hypertension: Secondary | ICD-10-CM | POA: Insufficient documentation

## 2013-09-25 DIAGNOSIS — Z7982 Long term (current) use of aspirin: Secondary | ICD-10-CM | POA: Insufficient documentation

## 2013-09-25 DIAGNOSIS — E785 Hyperlipidemia, unspecified: Secondary | ICD-10-CM | POA: Insufficient documentation

## 2013-09-25 DIAGNOSIS — Z85820 Personal history of malignant melanoma of skin: Secondary | ICD-10-CM | POA: Insufficient documentation

## 2013-09-25 MED ORDER — ALBUTEROL SULFATE HFA 108 (90 BASE) MCG/ACT IN AERS
2.0000 | INHALATION_SPRAY | Freq: Once | RESPIRATORY_TRACT | Status: AC
Start: 2013-09-25 — End: 2013-09-25
  Administered 2013-09-25: 2 via RESPIRATORY_TRACT
  Filled 2013-09-25: qty 6.7

## 2013-09-25 MED ORDER — BENZONATATE 200 MG PO CAPS: 200.0000 mg | ORAL_CAPSULE | Freq: Three times a day (TID) | ORAL | Status: DC | PRN

## 2013-09-25 MED ORDER — AEROCHAMBER Z-STAT PLUS/MEDIUM MISC: 1.0000 | Freq: Once | Status: DC

## 2013-09-25 MED ORDER — AZITHROMYCIN 250 MG PO TABS: 250.0000 mg | ORAL_TABLET | Freq: Every day | ORAL | Status: DC

## 2013-09-25 NOTE — ED Notes (Signed)
Pt awaiting RT for inhaler and spacer educ.

## 2013-09-25 NOTE — Discharge Instructions (Signed)
Acute Bronchitis Bronchitis is inflammation of the airways that extend from the windpipe into the lungs (bronchi). The inflammation often causes mucus to develop. This leads to a cough, which is the most common symptom of bronchitis.  In acute bronchitis, the condition usually develops suddenly and goes away over time, usually in a couple weeks. Smoking, allergies, and asthma can make bronchitis worse. Repeated episodes of bronchitis may cause further lung problems.  CAUSES Acute bronchitis is most often caused by the same virus that causes a cold. The virus can spread from person to person (contagious).  SIGNS AND SYMPTOMS   Cough.   Fever.   Coughing up mucus.   Body aches.   Chest congestion.   Chills.   Shortness of breath.   Sore throat.  DIAGNOSIS  Acute bronchitis is usually diagnosed through a physical exam. Tests, such as chest X-rays, are sometimes done to rule out other conditions.  TREATMENT  Acute bronchitis usually goes away in a couple weeks. Often times, no medical treatment is necessary. Medicines are sometimes given for relief of fever or cough. Antibiotics are usually not needed but may be prescribed in certain situations. In some cases, an inhaler may be recommended to help reduce shortness of breath and control the cough. A cool mist vaporizer may also be used to help thin bronchial secretions and make it easier to clear the chest.  HOME CARE INSTRUCTIONS  Get plenty of rest.   Drink enough fluids to keep your urine clear or pale yellow (unless you have a medical condition that requires fluid restriction). Increasing fluids may help thin your secretions and will prevent dehydration.   Only take over-the-counter or prescription medicines as directed by your health care provider.   Avoid smoking and secondhand smoke. Exposure to cigarette smoke or irritating chemicals will make bronchitis worse. If you are a smoker, consider using nicotine gum or skin  patches to help control withdrawal symptoms. Quitting smoking will help your lungs heal faster.   Reduce the chances of another bout of acute bronchitis by washing your hands frequently, avoiding people with cold symptoms, and trying not to touch your hands to your mouth, nose, or eyes.   Follow up with your health care provider as directed.  SEEK MEDICAL CARE IF: Your symptoms do not improve after 1 week of treatment.  SEEK IMMEDIATE MEDICAL CARE IF:  You develop an increased fever or chills.   You have chest pain.   You have severe shortness of breath.  You have bloody sputum.   You develop dehydration.  You develop fainting.  You develop repeated vomiting.  You develop a severe headache. MAKE SURE YOU:   Understand these instructions.  Will watch your condition.  Will get help right away if you are not doing well or get worse. Document Released: 08/04/2004 Document Revised: 02/27/2013 Document Reviewed: 12/18/2012 [employer] Patient Information 2014 [employer].  

## 2013-09-25 NOTE — ED Notes (Signed)
Pt c/o cough, congestion, dizziness and decreased appetite. Pt reports thick brown sputum.

## 2013-09-26 ENCOUNTER — Encounter (HOSPITAL_COMMUNITY): Payer: Self-pay | Admitting: Emergency Medicine

## 2013-09-26 ENCOUNTER — Emergency Department (HOSPITAL_COMMUNITY): Payer: BC Managed Care – PPO

## 2013-09-26 ENCOUNTER — Inpatient Hospital Stay (HOSPITAL_COMMUNITY): Payer: BC Managed Care – PPO

## 2013-09-26 ENCOUNTER — Inpatient Hospital Stay (HOSPITAL_COMMUNITY)
Admission: EM | Admit: 2013-09-26 | Discharge: 2013-09-29 | DRG: 641 | Disposition: A | Discharge status: Home / Self Care | Payer: BC Managed Care – PPO | Attending: Family Medicine | Admitting: Family Medicine

## 2013-09-26 DIAGNOSIS — Z87891 Personal history of nicotine dependence: Secondary | ICD-10-CM

## 2013-09-26 DIAGNOSIS — Z85820 Personal history of malignant melanoma of skin: Secondary | ICD-10-CM | POA: Diagnosis present

## 2013-09-26 DIAGNOSIS — E876 Hypokalemia: Secondary | ICD-10-CM | POA: Diagnosis present

## 2013-09-26 DIAGNOSIS — T502X5A Adverse effect of carbonic-anhydrase inhibitors, benzothiadiazides and other diuretics, initial encounter: Secondary | ICD-10-CM | POA: Diagnosis present

## 2013-09-26 DIAGNOSIS — E861 Hypovolemia: Secondary | ICD-10-CM | POA: Diagnosis present

## 2013-09-26 DIAGNOSIS — E871 Hypo-osmolality and hyponatremia: Principal | ICD-10-CM | POA: Diagnosis present

## 2013-09-26 DIAGNOSIS — Z87898 Personal history of other specified conditions: Secondary | ICD-10-CM | POA: Diagnosis present

## 2013-09-26 DIAGNOSIS — R569 Unspecified convulsions: Secondary | ICD-10-CM

## 2013-09-26 DIAGNOSIS — R079 Chest pain, unspecified: Secondary | ICD-10-CM | POA: Diagnosis present

## 2013-09-26 DIAGNOSIS — G40909 Epilepsy, unspecified, not intractable, without status epilepticus: Secondary | ICD-10-CM | POA: Diagnosis present

## 2013-09-26 DIAGNOSIS — R0789 Other chest pain: Secondary | ICD-10-CM | POA: Diagnosis present

## 2013-09-26 DIAGNOSIS — C439 Malignant melanoma of skin, unspecified: Secondary | ICD-10-CM

## 2013-09-26 DIAGNOSIS — Z6837 Body mass index (BMI) 37.0-37.9, adult: Secondary | ICD-10-CM

## 2013-09-26 DIAGNOSIS — I10 Essential (primary) hypertension: Secondary | ICD-10-CM | POA: Diagnosis present

## 2013-09-26 DIAGNOSIS — Z7982 Long term (current) use of aspirin: Secondary | ICD-10-CM

## 2013-09-26 DIAGNOSIS — E785 Hyperlipidemia, unspecified: Secondary | ICD-10-CM | POA: Diagnosis present

## 2013-09-26 DIAGNOSIS — F1011 Alcohol abuse, in remission: Secondary | ICD-10-CM | POA: Diagnosis present

## 2013-09-26 HISTORY — DX: Personal history of other specified conditions: Z87.898

## 2013-09-26 LAB — BASIC METABOLIC PANEL
BUN: 7 mg/dL (ref 6–23)
BUN: 7 mg/dL (ref 6–23)
BUN: 6 mg/dL (ref 6–23)
BUN: 6 mg/dL (ref 6–23)
BUN: 6 mg/dL (ref 6–23)
BUN: 6 mg/dL (ref 6–23)
BUN: 7 mg/dL (ref 6–23)
CHLORIDE: 73 meq/L — AB (ref 96–112)
CHLORIDE: 73 meq/L — AB (ref 96–112)
CHLORIDE: 74 meq/L — AB (ref 96–112)
CHLORIDE: 74 meq/L — AB (ref 96–112)
CHLORIDE: 76 meq/L — AB (ref 96–112)
CO2: 22 mEq/L (ref 19–32)
CO2: 30 mEq/L (ref 19–32)
CO2: 31 mEq/L (ref 19–32)
CO2: 31 mEq/L (ref 19–32)
CO2: 31 meq/L (ref 19–32)
CO2: 32 mEq/L (ref 19–32)
CO2: 31 meq/L (ref 19–32)
CREATININE: 0.56 mg/dL (ref 0.50–1.10)
Calcium: 7.9 mg/dL — ABNORMAL LOW (ref 8.4–10.5)
Calcium: 8.1 mg/dL — ABNORMAL LOW (ref 8.4–10.5)
Calcium: 8 mg/dL — ABNORMAL LOW (ref 8.4–10.5)
Calcium: 8 mg/dL — ABNORMAL LOW (ref 8.4–10.5)
Calcium: 8 mg/dL — ABNORMAL LOW (ref 8.4–10.5)
Calcium: 8 mg/dL — ABNORMAL LOW (ref 8.4–10.5)
Calcium: 7.8 mg/dL — ABNORMAL LOW (ref 8.4–10.5)
Chloride: 70 mEq/L — ABNORMAL LOW (ref 96–112)
Chloride: 70 mEq/L — ABNORMAL LOW (ref 96–112)
Creatinine, Ser: 0.49 mg/dL — ABNORMAL LOW (ref 0.50–1.10)
Creatinine, Ser: 0.5 mg/dL (ref 0.50–1.10)
Creatinine, Ser: 0.5 mg/dL (ref 0.50–1.10)
Creatinine, Ser: 0.51 mg/dL (ref 0.50–1.10)
Creatinine, Ser: 0.55 mg/dL (ref 0.50–1.10)
Creatinine, Ser: 0.53 mg/dL (ref 0.50–1.10)
GFR calc Af Amer: >90 mL/min (ref >90)
GFR calc Af Amer: >90 mL/min (ref >90)
GFR calc Af Amer: >90 mL/min (ref >90)
GFR calc Af Amer: >90 mL/min (ref >90)
GFR calc Af Amer: >90 mL/min (ref >90)
GFR calc Af Amer: >90 mL/min (ref >90)
GFR calc non Af Amer: >90 mL/min (ref >90)
GFR calc non Af Amer: >90 mL/min (ref >90)
GFR calc non Af Amer: >90 mL/min (ref >90)
GFR calc non Af Amer: >90 mL/min (ref >90)
GFR calc non Af Amer: >90 mL/min (ref >90)
GLUCOSE: 127 mg/dL — AB (ref 70–99)
GLUCOSE: 102 mg/dL — AB (ref 70–99)
GLUCOSE: 107 mg/dL — AB (ref 70–99)
Glucose, Bld: 150 mg/dL — ABNORMAL HIGH (ref 70–99)
Glucose, Bld: 125 mg/dL — ABNORMAL HIGH (ref 70–99)
Glucose, Bld: 117 mg/dL — ABNORMAL HIGH (ref 70–99)
Glucose, Bld: 154 mg/dL — ABNORMAL HIGH (ref 70–99)
POTASSIUM: 2.8 meq/L — AB (ref 3.7–5.3)
POTASSIUM: 2.7 meq/L — AB (ref 3.7–5.3)
POTASSIUM: 2.4 meq/L — AB (ref 3.7–5.3)
POTASSIUM: 2.3 meq/L — AB (ref 3.7–5.3)
Potassium: 2.2 mEq/L — CL (ref 3.7–5.3)
Potassium: 2.3 mEq/L — CL (ref 3.7–5.3)
Potassium: 2.7 mEq/L — CL (ref 3.7–5.3)
SODIUM: 115 meq/L — AB (ref 137–147)
SODIUM: 116 meq/L — AB (ref 137–147)
SODIUM: 116 meq/L — AB (ref 137–147)
SODIUM: 117 meq/L — AB (ref 137–147)
SODIUM: 117 meq/L — AB (ref 137–147)
Sodium: 114 mEq/L — CL (ref 137–147)
Sodium: 118 mEq/L — CL (ref 137–147)

## 2013-09-26 LAB — CBC WITH DIFFERENTIAL/PLATELET
Basophils Absolute: 0 10*3/uL (ref 0.0–0.1)
Basophils Relative: 0 % (ref 0–1)
EOS PCT: 0 % (ref 0–5)
Eosinophils Absolute: 0 10*3/uL (ref 0.0–0.7)
HEMATOCRIT: 42.2 % (ref 36.0–46.0)
HEMOGLOBIN: 14.9 g/dL (ref 12.0–15.0)
LYMPHS ABS: 3.8 10*3/uL (ref 0.7–4.0)
LYMPHS PCT: 25 % (ref 12–46)
MCH: 27.3 pg (ref 26.0–34.0)
MCHC: 35.3 g/dL (ref 30.0–36.0)
MCV: 77.3 fL — AB (ref 78.0–100.0)
MONO ABS: 1.6 10*3/uL — AB (ref 0.1–1.0)
MONOS PCT: 11 % (ref 3–12)
Neutro Abs: 9.7 10*3/uL — ABNORMAL HIGH (ref 1.7–7.7)
Neutrophils Relative %: 64 % (ref 43–77)
Platelets: 316 10*3/uL (ref 150–400)
RBC: 5.46 MIL/uL — AB (ref 3.87–5.11)
RDW: 12.8 % (ref 11.5–15.5)
WBC: 15 10*3/uL — AB (ref 4.0–10.5)

## 2013-09-26 LAB — COMPREHENSIVE METABOLIC PANEL
ALT: 18 U/L (ref 0–35)
AST: 31 U/L (ref 0–37)
Albumin: 3.8 g/dL (ref 3.5–5.2)
Alkaline Phosphatase: 96 U/L (ref 39–117)
BILIRUBIN TOTAL: 1 mg/dL (ref 0.3–1.2)
BUN: 8 mg/dL (ref 6–23)
CALCIUM: 8.3 mg/dL — AB (ref 8.4–10.5)
CO2: 22 meq/L (ref 19–32)
CREATININE: 0.6 mg/dL (ref 0.50–1.10)
Chloride: 66 mEq/L — ABNORMAL LOW (ref 96–112)
GLUCOSE: 211 mg/dL — AB (ref 70–99)
Potassium: 2.4 mEq/L — CL (ref 3.7–5.3)
Sodium: 115 mEq/L — CL (ref 137–147)
Total Protein: 7.1 g/dL (ref 6.0–8.3)

## 2013-09-26 LAB — OSMOLALITY: Osmolality: 230 mOsm/kg — ABNORMAL LOW (ref 275–300)

## 2013-09-26 LAB — URINE MICROSCOPIC-ADD ON

## 2013-09-26 LAB — MAGNESIUM
MAGNESIUM: 2.3 mg/dL (ref 1.5–2.5)
Magnesium: 1.3 mg/dL — ABNORMAL LOW (ref 1.5–2.5)
Magnesium: 2.3 mg/dL (ref 1.5–2.5)

## 2013-09-26 LAB — NA AND K (SODIUM & POTASSIUM), RAND UR
POTASSIUM UR: 28 meq/L
SODIUM UR: 64 meq/L

## 2013-09-26 LAB — RAPID URINE DRUG SCREEN, HOSP PERFORMED
Amphetamines: NOT DETECTED
Barbiturates: NOT DETECTED
Benzodiazepines: NOT DETECTED
Cocaine: NOT DETECTED
Opiates: NOT DETECTED
Tetrahydrocannabinol: NOT DETECTED

## 2013-09-26 LAB — CBG MONITORING, ED: GLUCOSE-CAPILLARY: 212 mg/dL — AB (ref 70–99)

## 2013-09-26 LAB — URINALYSIS, ROUTINE W REFLEX MICROSCOPIC
BILIRUBIN URINE: NEGATIVE
GLUCOSE, UA: 100 mg/dL — AB
KETONES UR: NEGATIVE mg/dL
LEUKOCYTES UA: NEGATIVE
Nitrite: NEGATIVE
PROTEIN: 30 mg/dL — AB
Specific Gravity, Urine: 1.02 (ref 1.005–1.030)
Urobilinogen, UA: 0.2 mg/dL (ref 0.0–1.0)
pH: 6 (ref 5.0–8.0)

## 2013-09-26 LAB — PREGNANCY, URINE: Preg Test, Ur: NEGATIVE

## 2013-09-26 LAB — PRO B NATRIURETIC PEPTIDE: Pro B Natriuretic peptide (BNP): 711.4 pg/mL — ABNORMAL HIGH (ref 0–125)

## 2013-09-26 LAB — TROPONIN I: Troponin I: <0.3 ng/mL

## 2013-09-26 LAB — PROTIME-INR
INR: 0.99 (ref 0.00–1.49)
Prothrombin Time: 12.9 seconds (ref 11.6–15.2)

## 2013-09-26 LAB — CHLORIDE, URINE, RANDOM: CHLORIDE URINE: 67 meq/L

## 2013-09-26 LAB — PHOSPHORUS
PHOSPHORUS: 2.6 mg/dL (ref 2.3–4.6)
Phosphorus: 2.5 mg/dL (ref 2.3–4.6)

## 2013-09-26 LAB — MRSA PCR SCREENING: MRSA by PCR: NEGATIVE

## 2013-09-26 LAB — ETHANOL: Alcohol, Ethyl (B): <11 mg/dL (ref 0–11)

## 2013-09-26 LAB — ACETAMINOPHEN LEVEL: Acetaminophen (Tylenol), Serum: <15 ug/mL (ref 10–30)

## 2013-09-26 LAB — SALICYLATE LEVEL: Salicylate Lvl: 2.9 mg/dL (ref 2.8–20.0)

## 2013-09-26 MED ORDER — ATENOLOL 25 MG PO TABS
25.0000 mg | ORAL_TABLET | Freq: Every day | ORAL | Status: DC
  Administered 2013-09-26 – 2013-09-28 (×3): 25 mg via ORAL
  Filled 2013-09-26 (×3): qty 1

## 2013-09-26 MED ORDER — POTASSIUM CHLORIDE 10 MEQ/100ML IV SOLN
INTRAVENOUS | Status: AC
  Filled 2013-09-26: qty 100

## 2013-09-26 MED ORDER — POTASSIUM CHLORIDE CRYS ER 20 MEQ PO TBCR
40.0000 meq | EXTENDED_RELEASE_TABLET | Freq: Once | ORAL | Status: AC
  Administered 2013-09-26: 40 meq via ORAL
  Filled 2013-09-26: qty 2

## 2013-09-26 MED ORDER — MAGNESIUM SULFATE 40 MG/ML IJ SOLN
2.0000 g | Freq: Once | INTRAMUSCULAR | Status: AC
  Administered 2013-09-26: 2 g via INTRAVENOUS
  Filled 2013-09-26: qty 50

## 2013-09-26 MED ORDER — SODIUM CHLORIDE 0.9 % IJ SOLN
3.0000 mL | Freq: Two times a day (BID) | INTRAMUSCULAR | Status: DC
  Administered 2013-09-26 – 2013-09-28 (×5): 3 mL via INTRAVENOUS

## 2013-09-26 MED ORDER — LORAZEPAM 2 MG/ML IJ SOLN
1.0000 mg | Freq: Once | INTRAMUSCULAR | Status: AC
  Administered 2013-09-26: 1 mg via INTRAVENOUS
  Filled 2013-09-26: qty 1

## 2013-09-26 MED ORDER — POTASSIUM CHLORIDE 10 MEQ/100ML IV SOLN
10.0000 meq | INTRAVENOUS | Status: AC
  Administered 2013-09-26 – 2013-09-27 (×4): 10 meq via INTRAVENOUS

## 2013-09-26 MED ORDER — SODIUM CHLORIDE 0.9 % IV SOLN
Freq: Once | INTRAVENOUS | Status: AC
  Administered 2013-09-26: 11:00:00 via INTRAVENOUS

## 2013-09-26 MED ORDER — SODIUM CHLORIDE 3 % IV SOLN
INTRAVENOUS | Status: DC
  Filled 2013-09-26: qty 500

## 2013-09-26 MED ORDER — SODIUM CHLORIDE 0.9 % IV SOLN: INTRAVENOUS | Status: DC

## 2013-09-26 MED ORDER — LORAZEPAM 2 MG/ML IJ SOLN: 1.0000 mg | Freq: Once | INTRAMUSCULAR | Status: DC

## 2013-09-26 MED ORDER — HEPARIN SODIUM (PORCINE) 5000 UNIT/ML IJ SOLN
5000.0000 [IU] | Freq: Three times a day (TID) | INTRAMUSCULAR | Status: DC
  Administered 2013-09-26 – 2013-09-29 (×9): 5000 [IU] via SUBCUTANEOUS
  Filled 2013-09-26 (×9): qty 1

## 2013-09-26 MED ORDER — LORAZEPAM 2 MG/ML IJ SOLN: 1.0000 mg | INTRAMUSCULAR | Status: DC | PRN

## 2013-09-26 MED ORDER — SODIUM CHLORIDE 0.9 % IV BOLUS (SEPSIS)
1000.0000 mL | Freq: Once | INTRAVENOUS | Status: AC
  Administered 2013-09-26: 1000 mL via INTRAVENOUS

## 2013-09-26 MED ORDER — POTASSIUM CHLORIDE 10 MEQ/100ML IV SOLN
10.0000 meq | Freq: Once | INTRAVENOUS | Status: AC
  Administered 2013-09-26: 10 meq via INTRAVENOUS
  Filled 2013-09-26: qty 100

## 2013-09-26 MED ORDER — SODIUM CHLORIDE 0.9 % IV SOLN
INTRAVENOUS | Status: DC
  Administered 2013-09-26 (×2): via INTRAVENOUS
  Filled 2013-09-26 (×4): qty 1000

## 2013-09-26 NOTE — ED Notes (Signed)
Critical Labs reported to Dr. Wyvonnia Dusky. MD at bedside.

## 2013-09-26 NOTE — Care Management Utilization Note (Signed)
UR completed 

## 2013-09-26 NOTE — Progress Notes (Signed)
DR Verlon Au NOTIFIED THAT BMP RESULTS ARE RISING VERY SLOWLY. NO NEW ORDERS.

## 2013-09-26 NOTE — ED Provider Notes (Signed)
CSN: [id number]     Arrival date & time 09/25/13  1149 History   First MD Initiated Contact with Patient 09/25/13 1233     Chief Complaint  Patient presents with  . Cough     (Consider location/radiation/quality/duration/timing/severity/associated sxs/prior Treatment) HPI Comments: Sarah Palmer is a 58 y.o. Female presenting with a productive cough significant for a thick brown sputum, has not seen blood with shortness of breath and wheezing.  Her symptoms started as nasal congestion and sore throat which has since "moved to her chest".  She has just stopped smoking about 3 weeks ago.  She denies fevers or chills and denies chest pain except for upper chest burning pain with cough.  She has taken otc cough medications without relief of her symptoms.  She endorses generalized fatigue     The history is provided by the patient.    Past Medical History  Diagnosis Date  . Hyperlipidemia     lipid profile in 2009:241, 122, 73, 144  . Hypertension   . Chest pain 2009    associated with palpitations; negative chest CT in 2009  . Melanoma 1999    resected from lower extremity  . Tobacco abuse   . History of alcohol abuse 2009   Past Surgical History  Procedure Laterality Date  . Melanoma excision  1999    lower extremity  . Colonoscopy  never   Family History  Problem Relation Age of Onset  . Hypertension    . COPD Father    History  Substance Use Topics  . Smoking status: Former Smoker -- 0.40 packs/day for 40 years    Types: Cigarettes    Quit date: 09/04/2013  . Smokeless tobacco: Former Systems developer    Quit date: 06/24/2012     Comment: first quit attempt in 06/2012  . Alcohol Use: 0.0 oz/week    2-3 drink(s) per week     Comment: excessive alcohol use in the past noted in 2009   OB History   Grav Para Term Preterm Abortions TAB SAB Ect Mult Living                 Review of Systems  Constitutional: Positive for fatigue. Negative for fever.  HENT: Negative for  congestion and sore throat.   Eyes: Negative.   Respiratory: Positive for cough, shortness of breath and wheezing. Negative for chest tightness.   Cardiovascular: Negative for chest pain and leg swelling.  Gastrointestinal: Negative for nausea and abdominal pain.  Genitourinary: Negative.   Musculoskeletal: Negative for arthralgias, joint swelling and neck pain.  Skin: Negative.  Negative for rash and wound.  Neurological: Negative for dizziness, weakness, light-headedness, numbness and headaches.  Psychiatric/Behavioral: Negative.       Allergies  Penicillins  Home Medications   Current Outpatient Rx  Name  Route  Sig  Dispense  Refill  . aspirin 81 MG tablet   Oral   Take 81 mg by mouth daily.         Marland Kitchen atenolol (TENORMIN) 25 MG tablet   Oral   Take 25 mg by mouth daily.          . Flaxseed, Linseed, (FLAX SEED OIL PO)   Oral   Take 1 tablet by mouth daily.          . hydrochlorothiazide (MICROZIDE) 12.5 MG capsule   Oral   Take 12.5 mg by mouth daily.         Marland Kitchen omega-3 acid ethyl esters (LOVAZA) 1  G capsule   Oral   Take 1 g by mouth daily.          Marland Kitchen azithromycin (ZITHROMAX) 250 MG tablet   Oral   Take 1 tablet (250 mg total) by mouth daily. Take first 2 tablets together, then 1 every day until finished.   6 tablet   0   . benzonatate (TESSALON) 200 MG capsule   Oral   Take 1 capsule (200 mg total) by mouth 3 (three) times daily as needed for cough.   30 capsule   0    BP 180/90  Pulse 70  Temp(Src) 97.8 F (36.6 C) (Oral)  Resp 16  Ht 5\' 6"  (1.676 m)  Wt 225 lb (102.059 kg)  BMI 36.33 kg/m2  SpO2 98% Physical Exam  Nursing note and vitals reviewed. Constitutional: She appears well-developed and well-nourished.  HENT:  Head: Normocephalic and atraumatic.  Eyes: Conjunctivae are normal.  Neck: Normal range of motion.  Cardiovascular: Normal rate, regular rhythm, normal heart sounds and intact distal pulses.   Pulmonary/Chest: Effort  normal. No respiratory distress. She has decreased breath sounds. She has wheezes. She has rhonchi. She has no rales.  Coarse breath sounds,  Faint expiratory wheeze right lung fields.  Abdominal: Soft. Bowel sounds are normal. There is no tenderness.  Musculoskeletal: Normal range of motion.  Neurological: She is alert.  Skin: Skin is warm and dry.  Psychiatric: She has a normal mood and affect.    ED Course  Procedures (including critical care time) Labs Review Labs Reviewed - No data to display Imaging Review Dg Chest 2 View  09/25/2013   CLINICAL DATA:  Cough  EXAM: CHEST  2 VIEW  COMPARISON:  07/23/2012  FINDINGS: The heart size and mediastinal contours are within normal limits. Both lungs are clear. The visualized skeletal structures are unremarkable.  IMPRESSION: No active cardiopulmonary disease.   Electronically Signed   By: Franchot Gallo M.D.   On: 09/25/2013 13:09     EKG Interpretation None      MDM   Final diagnoses:  Bronchitis, acute, with bronchospasm    Patients labs and/or radiological studies were viewed and considered during the medical decision making and disposition process. No sign of pneumonia on xray, but with h/o smoking,  Will cover for possible bacterial source.  Zithromax,  Tessalon for cough,  Also given albuterol mdi with spacer,  Instructed in use.  Plan f/u with pcp within 1 week if not improved.  The patient appears reasonably screened and/or stabilized for discharge and I doubt any other medical condition or other [facility] requiring further screening, evaluation, or treatment in the ED at this time prior to discharge.     Evalee Jefferson, PA-C 09/26/13 [phone]

## 2013-09-26 NOTE — ED Provider Notes (Signed)
CSN: [id number]     Arrival date & time 09/26/13  0906 History  This chart was scribed for Ezequiel Essex, MD by Allena Earing, ED Scribe. This patient was seen in room APA02/APA02 and the patient's care was started at 9:19 AM .    Chief Complaint  Patient presents with  . Seizures      The history is provided by the spouse. No language interpreter was used.    HPI Comments: Sarah Palmer is a 58 y.o. female with h/o seizures who presents to the Emergency Department after she experienced a seizure in triage. Her husband had brought her to the hospital due to vomiting, as she experienced 4-5 episodes of vomiting overnight. Husband reports associated weakness that began last night. Pt's husband states that pt was having difficulty walking last night and seemed "off".  Pt is a recovering alcoholic who has experienced seizures in the past that were associated with her alcohol consumption, husband reports that her last seizure was 20 years ago. Husband reports that to his knowledge, pt has not consumsed alcohol. Pt was in ED yesterday for a cough. Pt stopped smoking 3 weeks ago.   Past Medical History  Diagnosis Date  . Hyperlipidemia     lipid profile in 2009:241, 122, 73, 144  . Hypertension   . Chest pain 2009    associated with palpitations; negative chest CT in 2009  . Melanoma 1999    resected from lower extremity  . Tobacco abuse   . History of alcohol abuse 2009   Past Surgical History  Procedure Laterality Date  . Melanoma excision  1999    lower extremity  . Colonoscopy  never   Family History  Problem Relation Age of Onset  . Hypertension    . COPD Father    History  Substance Use Topics  . Smoking status: Former Smoker -- 0.40 packs/day for 40 years    Types: Cigarettes    Quit date: 09/04/2013  . Smokeless tobacco: Former Systems developer    Quit date: 06/24/2012     Comment: first quit attempt in 06/2012  . Alcohol Use: 0.0 oz/week    2-3 drink(s) per week      Comment: excessive alcohol use in the past noted in 2009   OB History   Grav Para Term Preterm Abortions TAB SAB Ect Mult Living                 Review of Systems  Respiratory: Positive for cough.   Gastrointestinal: Positive for vomiting.  Musculoskeletal: Positive for gait problem.  Psychiatric/Behavioral: Positive for sleep disturbance.    A complete 10 system review of systems was obtained and all systems are negative except as noted in the HPI and PMH.      Allergies  Penicillins  Home Medications   No current outpatient prescriptions on file. BP 142/84  Pulse 64  Temp(Src) 98.3 F (36.8 C) (Oral)  Resp 16  Ht 5\' 6"  (1.676 m)  Wt 230 lb 2.6 oz (104.4 kg)  BMI 37.17 kg/m2  SpO2 100% Physical Exam  Nursing note and vitals reviewed. Constitutional: She appears well-developed and well-nourished. She is active. She appears distressed.  Postictal, minimally responsive.  Grunting Protecting airway Responds to commands  HENT:  Head: Atraumatic.  Mouth/Throat: Oropharynx is clear and moist. No oropharyngeal exudate.  Abrasion to tongue  Eyes: Pupils are equal, round, and reactive to light.  Neck: Normal range of motion. Neck supple.  No meningismus  Cardiovascular: Normal  rate, regular rhythm, normal heart sounds and intact distal pulses.   No murmur heard. Pulmonary/Chest: Effort normal and breath sounds normal. No respiratory distress. She has no wheezes.  Abdominal: Soft. Normal appearance. There is no tenderness. There is no rebound and no guarding.  Genitourinary:  Urine incontinence  Musculoskeletal: Normal range of motion. She exhibits no edema and no tenderness.  Neurological: She has normal reflexes.  Moving all extremities spontaneously, does not follow commands.  Skin: Skin is warm.    ED Course  CENTRAL LINE Date/Time: 09/26/2013 1:03 PM Performed by: Ezequiel Essex Authorized by: Ezequiel Essex Consent: Verbal consent obtained. written  consent obtained. Risks and benefits: risks, benefits and alternatives were discussed Consent given by: patient Patient understanding: patient states understanding of the procedure being performed Patient consent: the patient's understanding of the procedure matches consent given Procedure consent: procedure consent matches procedure scheduled Relevant documents: relevant documents present and verified Test results: test results available and properly labeled Site marked: the operative site was marked Imaging studies: imaging studies available Patient identity confirmed: verbally with patient and provided demographic data Time out: Immediately prior to procedure a "time out" was called to verify the correct patient, procedure, equipment, support staff and site/side marked as required. Indications: vascular access Anesthesia: local infiltration Local anesthetic: lidocaine 1% without epinephrine Anesthetic total: 4 ml Patient sedated: no Preparation: skin prepped with ChloraPrep Skin prep agent dried: skin prep agent completely dried prior to procedure Sterile barriers: all five maximum sterile barriers used - cap, mask, sterile gown, sterile gloves, and large sterile sheet Hand hygiene: hand hygiene performed prior to central venous catheter insertion Location details: right internal jugular Patient position: Trendelenburg Catheter type: triple lumen Pre-procedure: landmarks identified Ultrasound guidance: yes Number of attempts: 1 Successful placement: yes Post-procedure: line sutured and dressing applied Assessment: blood return through all ports,  free fluid flow,  placement verified by x-ray and no pneumothorax on x-ray Patient tolerance: Patient tolerated the procedure well with no immediate complications.   (including critical care time)  DIAGNOSTIC STUDIES: Oxygen Saturation is 98% on RA, normal by my interpretation.    COORDINATION OF CARE:  9:30 AM-Discussed treatment  plan which includes labs, CT head, and EKG with pt at bedside and pt agreed to plan.   10:20 AM- Pt is awake and oriented Ax2.  Pt reports that she doesn't remember coming to the ED. Pt reports that she has not had any alcohol. Husband reports that pt is not completely normal. Pt is shaking.   10:49 AM- Discussed low sodium and potassium levels with pt. Pt is closer to normal that last recheck. Husband reports that shaking is not normal.      Labs Review Labs Reviewed  CBC WITH DIFFERENTIAL - Abnormal; Notable for the following:    WBC 15.0 (*)    RBC 5.46 (*)    MCV 77.3 (*)    Neutro Abs 9.7 (*)    Monocytes Absolute 1.6 (*)    All other components within normal limits  COMPREHENSIVE METABOLIC PANEL - Abnormal; Notable for the following:    Sodium 115 (*)    Potassium 2.4 (*)    Chloride 66 (*)    Glucose, Bld 211 (*)    Calcium 8.3 (*)    All other components within normal limits  URINALYSIS, ROUTINE W REFLEX MICROSCOPIC - Abnormal; Notable for the following:    Glucose, UA 100 (*)    Hgb urine dipstick MODERATE (*)    Protein, ur 30 (*)  All other components within normal limits  MAGNESIUM - Abnormal; Notable for the following:    Magnesium 1.3 (*)    All other components within normal limits  BASIC METABOLIC PANEL - Abnormal; Notable for the following:    Sodium 114 (*)    Potassium 2.2 (*)    Chloride 70 (*)    Glucose, Bld 150 (*)    Creatinine, Ser 0.49 (*)    Calcium 7.9 (*)    All other components within normal limits  BASIC METABOLIC PANEL - Abnormal; Notable for the following:    Sodium 115 (*)    Potassium 2.3 (*)    Chloride 70 (*)    Glucose, Bld 127 (*)    Calcium 8.1 (*)    All other components within normal limits  PRO B NATRIURETIC PEPTIDE - Abnormal; Notable for the following:    Pro B Natriuretic peptide (BNP) 711.4 (*)    All other components within normal limits  BASIC METABOLIC PANEL - Abnormal; Notable for the following:    Sodium 116  (*)    Potassium 2.8 (*)    Chloride 73 (*)    Glucose, Bld 125 (*)    Calcium 8.0 (*)    All other components within normal limits  CBG MONITORING, ED - Abnormal; Notable for the following:    Glucose-Capillary 212 (*)    All other components within normal limits  MRSA PCR SCREENING  TROPONIN I  PREGNANCY, URINE  URINE RAPID DRUG SCREEN (HOSP PERFORMED)  ETHANOL  ACETAMINOPHEN LEVEL  SALICYLATE LEVEL  MAGNESIUM  PHOSPHORUS  PROTIME-INR  CBC WITH DIFFERENTIAL  URINE MICROSCOPIC-ADD ON  TSH  CORTISOL-PM, BLOOD  OSMOLALITY, URINE  OSMOLALITY  BASIC METABOLIC PANEL  BASIC METABOLIC PANEL  BASIC METABOLIC PANEL  BASIC METABOLIC PANEL  BASIC METABOLIC PANEL  CBC  BASIC METABOLIC PANEL  BASIC METABOLIC PANEL  BASIC METABOLIC PANEL  MAGNESIUM  MAGNESIUM  MAGNESIUM  PHOSPHORUS  PHOSPHORUS  PHOSPHORUS  BASIC METABOLIC PANEL   Imaging Review Dg Chest 2 View  09/25/2013   CLINICAL DATA:  Cough  EXAM: CHEST  2 VIEW  COMPARISON:  07/23/2012  FINDINGS: The heart size and mediastinal contours are within normal limits. Both lungs are clear. The visualized skeletal structures are unremarkable.  IMPRESSION: No active cardiopulmonary disease.   Electronically Signed   By: Franchot Gallo M.D.   On: 09/25/2013 13:09   Ct Head Wo Contrast  09/26/2013   CLINICAL DATA:  Seizure  EXAM: CT HEAD WITHOUT CONTRAST  TECHNIQUE: Contiguous axial images were obtained from the base of the skull through the vertex without intravenous contrast.  COMPARISON:  None.  FINDINGS: The ventricles are normal in size and configuration. There is no mass, hemorrhage, extra-axial fluid collection, or midline shift. There is mild patchy small vessel disease in the centra semiovale bilaterally. Gray-white compartments otherwise appear normal. There is no demonstrable acute infarct.  Bony calvarium appears intact.  The mastoid air cells are clear.  IMPRESSION: Mild patchy small vessel disease in the centra semiovale  bilaterally. No intracranial mass, hemorrhage, or acute appearing infarct.   Electronically Signed   By: Lowella Grip M.D.   On: 09/26/2013 10:22   Dg Chest Portable 1 View  09/26/2013   CLINICAL DATA:  Seizure  EXAM: PORTABLE CHEST - 1 VIEW  COMPARISON:  September 25, 2013  FINDINGS: Lungs are clear. Heart size and pulmonary vascularity are normal. No adenopathy. There is atherosclerotic change in the aorta. No pneumothorax. No bone  lesions.  IMPRESSION: No edema or consolidation.   Electronically Signed   By: Lowella Grip M.D.   On: 09/26/2013 09:49   Dg Chest Port 1v Same Day  09/26/2013   CLINICAL DATA:  Central line placement  EXAM: PORTABLE CHEST - 1 VIEW SAME DAY  COMPARISON:  DG CHEST 1V PORT dated 09/26/2013  FINDINGS: There is a right-sided jugular central venous catheter with the tip projecting over the SVC. The heart size and mediastinal contours are within normal limits. Both lungs are clear. The visualized skeletal structures are unremarkable.  IMPRESSION: Right-sided jugular central venous catheter with the tip projecting over the SVC.   Electronically Signed   By: Kathreen Devoid   On: 09/26/2013 12:51     EKG Interpretation   Date/Time:  Thursday September 26 2013 09:49:47 EDT Ventricular Rate:  69 PR Interval:  190 QRS Duration: 122 QT Interval:  496 QTC Calculation: 531 R Axis:   32 Text Interpretation:  Normal sinus rhythm Non-specific intra-ventricular  conduction delay Nonspecific ST abnormality Abnormal ECG When compared  with ECG of 26-Sep-2013 09:22, Previous ECG has undetermined rhythm, needs  review Nonspecific ST and T wave abnormality Confirmed by Wyvonnia Dusky  MD,  Angie [phone]) on 09/26/2013 9:55:57 AM      MDM   Final diagnoses:  Hyponatremia  Seizure   Patient with seizure activity in triage. No further history available. Patient seen yesterday for bronchitis symptoms and given Tessalon and Zithromax. Husband reports patient had seizures 20 years ago when  she was abusing alcohol none since.  No recent alcohol abuse, has been sober for 20 years reportedly. Does not take seizure medication.  Blood sugars normal on arrival. She is given IV Ativan. She is protecting her airway but remains postictal. CT head is negative.   Mental status improved and the patient became awake and alert. She is oriented to herself and situation. She denies any pain. She does not recall the events leading up to come in the hospital this morning.  Found to be hyponatremic at 115. Severe hypokalemia and hypochloremia as well. This is the likely etiology of her seizure. She was started on gentle hydration and 50 cc an hour of normal saline.  Case discussed with Dr. Verlon Au of the hospitalist. He requests central line placement for hypertonic saline. Hyponatremia likely secondary to diuretic use in setting of poor by mouth intake.  BP 142/84  Pulse 64  Temp(Src) 98.3 F (36.8 C) (Oral)  Resp 16  Ht 5\' 6"  (1.676 m)  Wt 230 lb 2.6 oz (104.4 kg)  BMI 37.17 kg/m2  SpO2 100%     CRITICAL CARE Performed by: Ezequiel Essex Total critical care time: 60 Critical care time was exclusive of separately billable procedures and treating other patients. Critical care was necessary to treat or prevent imminent or life-threatening deterioration. Critical care was time spent personally by me on the following activities: development of treatment plan with patient and/or surrogate as well as nursing, discussions with consultants, evaluation of patient's response to treatment, examination of patient, obtaining history from patient or surrogate, ordering and performing treatments and interventions, ordering and review of laboratory studies, ordering and review of radiographic studies, pulse oximetry and re-evaluation of patient's condition.  I personally performed the services described in this documentation, which was scribed in my presence. The recorded information has been reviewed  and is accurate.       Ezequiel Essex, MD 09/26/13 [phone]

## 2013-09-26 NOTE — ED Notes (Signed)
Hospitalist Samtani at bedside.

## 2013-09-26 NOTE — H&P (Signed)
[facility] History and Physical  Sarah Palmer [id number] DOB: March 17, 1956 DOA: 09/26/2013  Referring physician: ED PCP: Florina Ou, MD  Specialists: Nephrology  Chief Complaint: Seizure  HPI: Sarah Palmer is a 58 y.o. female with  Known prior ETOH use [ quit 20 yrs ago], Known Htn on HCTZ,  MOrbid obestiy, Prior smoker till about 2 months agiowho started having URI symptoms 3/14.  She empirically took OTC meds and was generally having malaise until 3/19, when she sought med attention at [facility] ED. She was given a prescription of Tessalon as well as azithromycin and was able to take this however developed nausea late last p.m.Marland Kitchen She has not really been eating much over the past 5 days and has mainly been drinking fluids including juices chicken soup and medicine. At around 9:30 PM 3/18 she started to have nausea vomiting with phlegm and they decided to come back to emergency room. While in creat she giving history again, patient had an episode of unresponsiveness with seizure wherein she had generalized tonic-clonic movements per her husband along with icing of her lower lip and loss of bladder control. Patient was apparently postictal for about an hour and has only just come back to her normal senses. Her husband/boyfriend of 29 years reports that she has had seizures in the past secondary to alcohol use the last one being about 20 years ago. She does not take any over-the-counter supplements and has been taking her blood pressure medicines despite eating unable to eat or drink much.  Emergency room workup = sodium 115, potassium 2.4, chloride 66, BUN 8, creatinine 0.6, LFTs normal, point-of-care troponin 0.30 Repeat sodium 114 potassium 2.2 magnesium 1.3 CT head = minimal patchy small vessel disease and 70 over bilaterally no intracranial mass hemorrhage or acute empiric infarct Chest x-ray = no edema or consolidation    Review of Systems: The patient denies chest pain, fever, chills,  blurred vision, double vision, weakness in any one side or the, falls, rash, ill contacts, diarrhea, hematemesis, hematochezia, + Seizure + weakness generalized, + incontinence   Past Medical History  Diagnosis Date  . Hyperlipidemia     lipid profile in 2009:241, 122, 73, 144  . Hypertension   . Chest pain 2009    associated with palpitations; negative chest CT in 2009  . Melanoma 1999    resected from lower extremity  . Tobacco abuse   . History of alcohol abuse 2009   Past Surgical History  Procedure Laterality Date  . Melanoma excision  1999    lower extremity  . Colonoscopy  never   Social History:  History   Social History Narrative  . No narrative on file    Allergies  Allergen Reactions  . Penicillins     Family History  Problem Relation Age of Onset  . Hypertension    . COPD Father     Prior to Admission medications   Medication Sig Start Date End Date Taking? Authorizing Provider  aspirin 81 MG tablet Take 81 mg by mouth daily.   Yes Historical Provider, MD  atenolol (TENORMIN) 25 MG tablet Take 25 mg by mouth daily.  06/29/12  Yes Historical Provider, MD  azithromycin (ZITHROMAX) 250 MG tablet Take 1 tablet (250 mg total) by mouth daily. Take first 2 tablets together, then 1 every day until finished. 09/25/13  Yes Almyra Free Idol, PA-C  benzonatate (TESSALON) 200 MG capsule Take 1 capsule (200 mg total) by mouth 3 (three) times daily as needed for cough.  09/25/13  Yes Almyra Free Idol, PA-C  Flaxseed, Linseed, (FLAX SEED OIL PO) Take 1 tablet by mouth daily.    Yes Historical Provider, MD  hydrochlorothiazide (MICROZIDE) 12.5 MG capsule Take 12.5 mg by mouth daily.   Yes Historical Provider, MD  omega-3 acid ethyl esters (LOVAZA) 1 G capsule Take 1 g by mouth daily.    Yes Historical Provider, MD   Physical Exam: Filed Vitals:   09/26/13 1029 09/26/13 1104  BP: 137/89 158/87  Pulse: 78 72  Temp: 98.4 F (36.9 C)   TempSrc: Oral   Resp: 22 20  SpO2: 100% 100%      General:  Alert pleasant Morbid obesity, There is no weight on file to calculate BMI.  Eyes: EOMI, NCAT  ENT: soft, supple no LAN, trachea midline, no thyromegaly  Neck:  no bruit, no JVD soft supple   Cardiovascular:  S1-S2 slightly tachycardic  Respiratory:  clinically clear no added sound  Abdomen:  soft nontender no rebound no guarding  Skin:  no lower extremity edema  Musculoskeletal:  range of motion intact  Psychiatric:  euthymic  Neurologic:  power in the, knees, ankles, elbows and shoulders in both flexion and extension 5/5. Sensory is intact to cold touch lower extremities upper extremities, grip strength is symmetric and equal, vision by direct confrontation is normal, smile is symmetrical, sternocleidomastoid shoulder drug are equal bilaterally, gait not assessed, finger-nose-finger normal  Labs on Admission:  Basic Metabolic Panel:  Recent Labs Lab 09/26/13 0930 09/26/13 1048  NA 115* 114*  K 2.4* 2.2*  CL 66* 70*  CO2 22 22  GLUCOSE 211* 150*  BUN 8 7  CREATININE 0.60 0.49*  CALCIUM 8.3* 7.9*  MG  --  1.3*   Liver Function Tests:  Recent Labs Lab 09/26/13 0930  AST 31  ALT 18  ALKPHOS 96  BILITOT 1.0  PROT 7.1  ALBUMIN 3.8   No results found for this basename: LIPASE, AMYLASE,  in the last 168 hours No results found for this basename: AMMONIA,  in the last 168 hours CBC:  Recent Labs Lab 09/26/13 0930  WBC 15.0*  NEUTROABS 9.7*  HGB 14.9  HCT 42.2  MCV 77.3*  PLT 316   Cardiac Enzymes:  Recent Labs Lab 09/26/13 0930  TROPONINI <0.30    BNP (last 3 results) No results found for this basename: PROBNP,  in the last 8760 hours CBG:  Recent Labs Lab 09/26/13 0920  GLUCAP 212*    Radiological Exams on Admission: Dg Chest 2 View  09/25/2013   CLINICAL DATA:  Cough  EXAM: CHEST  2 VIEW  COMPARISON:  07/23/2012  FINDINGS: The heart size and mediastinal contours are within normal limits. Both lungs are clear. The  visualized skeletal structures are unremarkable.  IMPRESSION: No active cardiopulmonary disease.   Electronically Signed   By: Franchot Gallo M.D.   On: 09/25/2013 13:09   Ct Head Wo Contrast  09/26/2013   CLINICAL DATA:  Seizure  EXAM: CT HEAD WITHOUT CONTRAST  TECHNIQUE: Contiguous axial images were obtained from the base of the skull through the vertex without intravenous contrast.  COMPARISON:  None.  FINDINGS: The ventricles are normal in size and configuration. There is no mass, hemorrhage, extra-axial fluid collection, or midline shift. There is mild patchy small vessel disease in the centra semiovale bilaterally. Gray-white compartments otherwise appear normal. There is no demonstrable acute infarct.  Bony calvarium appears intact.  The mastoid air cells are clear.  IMPRESSION: Mild patchy  small vessel disease in the centra semiovale bilaterally. No intracranial mass, hemorrhage, or acute appearing infarct.   Electronically Signed   By: Lowella Grip M.D.   On: 09/26/2013 10:22   Dg Chest Portable 1 View  09/26/2013   CLINICAL DATA:  Seizure  EXAM: PORTABLE CHEST - 1 VIEW  COMPARISON:  September 25, 2013  FINDINGS: Lungs are clear. Heart size and pulmonary vascularity are normal. No adenopathy. There is atherosclerotic change in the aorta. No pneumothorax. No bone lesions.  IMPRESSION: No edema or consolidation.   Electronically Signed   By: Lowella Grip M.D.   On: 09/26/2013 09:49    EKG: Independently reviewed.  sinus rhythm PR interval 0.20 QRS axis 45, rapid R-wave progression across precordial leads [probable infected placement] the ST T-wave changes which are noted in prior EKGs.  Assessment/Plan Principal Problem:   Euvolemic  Hyponatremia, severe-potentially causing seizure.  Unfortunately cannot obtain urine.  We will catheterize  DDx likely secondary to poor by mouth intake with low solute in a setting of continued thiazide diuretic use causing salt wasting. Obtain TSH,  cortisol, urine osmolality, serum osmolality Would not get echocardiogram at present as no cardiomegaly noted and patient clinically volume overloaded and I do not suspect that this is secondary to CHF, but will add on proBNP 2 labs discussed with nephrologist, Dr. Hinda Lenis as I was going to start 3% saline to rapidly correct he recommends instead 125 cc an hour of saline + 60 mEq of potassium chloride, basic metabolic panel every 2 hourly until sodium around 120. He will see patient in consult-greatly appreciate help  Active Problems:   Seizure potentially related to low sodium levels-when necessary Ativan 1 mg every 4 when necessary seizure-if recurs will Load with Keppra 1000 mg , obtain EEG/MRI and consult neurology. It is very unlikely that there is any organic pathology causing this   Hypertension-continue atenolol monotherapy   Chest pain-given abnormal EKG and stress test done within the past 2-3 years that was normal per family. Will not pursue this further   Melanoma-monitor   History of alcohol abuse    85 minutes critical care time including face-to-face time greater than 50% as well as discussion with consultant   full code Discussed with the significant other in detail   Verlon Au [facility] Pager [phone]  If 7PM-7AM, please contact night-coverage [email or web address] Password [email or web address] 09/26/2013, 11:50 AM

## 2013-09-26 NOTE — ED Notes (Signed)
Report given to Cindy, RN.

## 2013-09-26 NOTE — ED Notes (Addendum)
Pt was brought to triage via wheelchair accompanied by RN and husband.  Pt appeared to seize for 15 seconds in triage.  Pt was transported to room 2. Rancour, MD at bedside.

## 2013-09-26 NOTE — ED Notes (Signed)
Pt attempted to use bedpan, unable to give specimen.

## 2013-09-26 NOTE — ED Notes (Signed)
Report given to 3rd Floor, RN. Will transport after MD finishes seeing pt.

## 2013-09-26 NOTE — Consult Note (Signed)
Reason for Consult: Hyponatremia Referring Physician: Dr.Samtani  Sarah Palmer is an 58 y.o. female.  HPI: She is a patient who has history of hypertension, history of irregular heartbeat and seizure disorder associated with alcohol use. Presently she was brought to the emergency room because of nausea , one episode of diarrhea and vomiting 4-5 times a day after she started taking Zithromax and  flax seed for her upper respiratory tract infection. When she was in emergency room she is an episode of seizure. The last seizure was about 20 years ago. Patient denies any history of recent alcohol use. Patient is not also when any antiseizure medication.  Past Medical History  Diagnosis Date  . Hyperlipidemia     lipid profile in 2009:241, 122, 73, 144  . Hypertension   . Chest pain 2009    associated with palpitations; negative chest CT in 2009  . Melanoma 1999    resected from lower extremity  . Tobacco abuse   . History of alcohol abuse 2009    Past Surgical History  Procedure Laterality Date  . Melanoma excision  1999    lower extremity  . Colonoscopy  never    Family History  Problem Relation Age of Onset  . Hypertension    . COPD Father     Social History:  reports that she quit smoking about 3 weeks ago. Her smoking use included Cigarettes. She has a 16 pack-year smoking history. She quit smokeless tobacco use about 15 months ago. She reports that she drinks alcohol. She reports that she does not use illicit drugs.  Allergies:  Allergies  Allergen Reactions  . Penicillins     Medications: I have reviewed the patient's current medications.  Results for orders placed during the hospital encounter of 09/26/13 (from the past 48 hour(s))  CBG MONITORING, ED     Status: Abnormal   Collection Time    09/26/13  9:20 AM      Result Value Ref Range   Glucose-Capillary 212 (*) 70 - 99 mg/dL  CBC WITH DIFFERENTIAL     Status: Abnormal   Collection Time    09/26/13  9:30 AM     Result Value Ref Range   WBC 15.0 (*) 4.0 - 10.5 K/uL   RBC 5.46 (*) 3.87 - 5.11 MIL/uL   Hemoglobin 14.9  12.0 - 15.0 g/dL   HCT 42.2  36.0 - 46.0 %   MCV 77.3 (*) 78.0 - 100.0 fL   MCH 27.3  26.0 - 34.0 pg   MCHC 35.3  30.0 - 36.0 g/dL   RDW 12.8  11.5 - 15.5 %   Platelets 316  150 - 400 K/uL   Neutrophils Relative % 64  43 - 77 %   Neutro Abs 9.7 (*) 1.7 - 7.7 K/uL   Lymphocytes Relative 25  12 - 46 %   Lymphs Abs 3.8  0.7 - 4.0 K/uL   Monocytes Relative 11  3 - 12 %   Monocytes Absolute 1.6 (*) 0.1 - 1.0 K/uL   Eosinophils Relative 0  0 - 5 %   Eosinophils Absolute 0.0  0.0 - 0.7 K/uL   Basophils Relative 0  0 - 1 %   Basophils Absolute 0.0  0.0 - 0.1 K/uL  COMPREHENSIVE METABOLIC PANEL     Status: Abnormal   Collection Time    09/26/13  9:30 AM      Result Value Ref Range   Sodium 115 (*) 137 - 147 mEq/L  Comment: CRITICAL RESULT CALLED TO, READ BACK BY AND VERIFIED WITH:     CREVINO,B AT 10:25AM ON 09/26/13 BY FESTERMAN,C   Potassium 2.4 (*) 3.7 - 5.3 mEq/L   Comment: CRITICAL RESULT CALLED TO, READ BACK BY AND VERIFIED WITH:     CREVINO,B AT 10:25AM ON 09/26/13 BY FESTERMAN,C   Chloride 66 (*) 96 - 112 mEq/L   CO2 22  19 - 32 mEq/L   Glucose, Bld 211 (*) 70 - 99 mg/dL   BUN 8  6 - 23 mg/dL   Creatinine, Ser 0.60  0.50 - 1.10 mg/dL   Calcium 8.3 (*) 8.4 - 10.5 mg/dL   Total Protein 7.1  6.0 - 8.3 g/dL   Albumin 3.8  3.5 - 5.2 g/dL   AST 31  0 - 37 U/L   ALT 18  0 - 35 U/L   Alkaline Phosphatase 96  39 - 117 U/L   Total Bilirubin 1.0  0.3 - 1.2 mg/dL   GFR calc non Af Amer >90  >90 mL/min   GFR calc Af Amer >90  >90 mL/min   Comment: (NOTE)     The eGFR has been calculated using the CKD EPI equation.     This calculation has not been validated in all clinical situations.     eGFR's persistently <90 mL/min signify possible Chronic Kidney     Disease.  TROPONIN I     Status: None   Collection Time    09/26/13  9:30 AM      Result Value Ref Range   Troponin I  <0.30  <0.30 ng/mL   Comment:            Due to the release kinetics of cTnI,     a negative result within the first hours     of the onset of symptoms does not rule out     myocardial infarction with certainty.     If myocardial infarction is still suspected,     repeat the test at appropriate intervals.  ETHANOL     Status: None   Collection Time    09/26/13  9:30 AM      Result Value Ref Range   Alcohol, Ethyl (B) <11  0 - 11 mg/dL   Comment:            LOWEST DETECTABLE LIMIT FOR     SERUM ALCOHOL IS 11 mg/dL     FOR MEDICAL PURPOSES ONLY  ACETAMINOPHEN LEVEL     Status: None   Collection Time    09/26/13  9:30 AM      Result Value Ref Range   Acetaminophen (Tylenol), Serum <15.0  10 - 30 ug/mL   Comment:            THERAPEUTIC CONCENTRATIONS VARY     SIGNIFICANTLY. A RANGE OF 10-30     ug/mL MAY BE AN EFFECTIVE     CONCENTRATION FOR MANY PATIENTS.     HOWEVER, SOME ARE BEST TREATED     AT CONCENTRATIONS OUTSIDE THIS     RANGE.     ACETAMINOPHEN CONCENTRATIONS     >150 ug/mL AT 4 HOURS AFTER     INGESTION AND >50 ug/mL AT 12     HOURS AFTER INGESTION ARE     OFTEN ASSOCIATED WITH TOXIC     REACTIONS.  SALICYLATE LEVEL     Status: None   Collection Time    09/26/13  9:30 AM  Result Value Ref Range   Salicylate Lvl 2.9  2.8 - 20.0 mg/dL  MAGNESIUM     Status: Abnormal   Collection Time    09/26/13 10:48 AM      Result Value Ref Range   Magnesium 1.3 (*) 1.5 - 2.5 mg/dL  BASIC METABOLIC PANEL     Status: Abnormal   Collection Time    09/26/13 10:48 AM      Result Value Ref Range   Sodium 114 (*) 137 - 147 mEq/L   Comment: CRITICAL RESULT CALLED TO, READ BACK BY AND VERIFIED WITH:     CREVINO,B AT 11:45AM ON 09/26/13 BY FESTERMAN,C   Potassium 2.2 (*) 3.7 - 5.3 mEq/L   Comment: CRITICAL RESULT CALLED TO, READ BACK BY AND VERIFIED WITH:     CREVINO,B AT 11:45AM ON 09/26/13 BY FESTERMAN,C   Chloride 70 (*) 96 - 112 mEq/L   CO2 22  19 - 32 mEq/L   Glucose,  Bld 150 (*) 70 - 99 mg/dL   BUN 7  6 - 23 mg/dL   Creatinine, Ser 0.49 (*) 0.50 - 1.10 mg/dL   Calcium 7.9 (*) 8.4 - 10.5 mg/dL   GFR calc non Af Amer >90  >90 mL/min   GFR calc Af Amer >90  >90 mL/min   Comment: (NOTE)     The eGFR has been calculated using the CKD EPI equation.     This calculation has not been validated in all clinical situations.     eGFR's persistently <90 mL/min signify possible Chronic Kidney     Disease.  BASIC METABOLIC PANEL     Status: Abnormal   Collection Time    09/26/13 12:41 PM      Result Value Ref Range   Sodium 115 (*) 137 - 147 mEq/L   Comment: CRITICAL RESULT CALLED TO, READ BACK BY AND VERIFIED WITH:     PHILLIPS,C AT 1330 BY HUFFINES,S ON 09/26/13   Potassium 2.3 (*) 3.7 - 5.3 mEq/L   Comment: CRITICAL RESULT CALLED TO, READ BACK BY AND VERIFIED WITH:     PHILLIPS,C AT 1330 ON 09/26/13 BY HUFFINES,S.   Chloride 70 (*) 96 - 112 mEq/L   CO2 30  19 - 32 mEq/L   Glucose, Bld 127 (*) 70 - 99 mg/dL   BUN 7  6 - 23 mg/dL   Creatinine, Ser 0.50  0.50 - 1.10 mg/dL   Calcium 8.1 (*) 8.4 - 10.5 mg/dL   GFR calc non Af Amer >90  >90 mL/min   GFR calc Af Amer >90  >90 mL/min   Comment: (NOTE)     The eGFR has been calculated using the CKD EPI equation.     This calculation has not been validated in all clinical situations.     eGFR's persistently <90 mL/min signify possible Chronic Kidney     Disease.  PRO B NATRIURETIC PEPTIDE     Status: Abnormal   Collection Time    09/26/13 12:41 PM      Result Value Ref Range   Pro B Natriuretic peptide (BNP) 711.4 (*) 0 - 125 pg/mL  URINALYSIS, ROUTINE W REFLEX MICROSCOPIC     Status: Abnormal   Collection Time    09/26/13  1:04 PM      Result Value Ref Range   Color, Urine YELLOW  YELLOW   APPearance CLEAR  CLEAR   Specific Gravity, Urine 1.020  1.005 - 1.030   pH 6.0  5.0 - 8.0  Glucose, UA 100 (*) NEGATIVE mg/dL   Hgb urine dipstick MODERATE (*) NEGATIVE   Bilirubin Urine NEGATIVE  NEGATIVE    Ketones, ur NEGATIVE  NEGATIVE mg/dL   Protein, ur 30 (*) NEGATIVE mg/dL   Urobilinogen, UA 0.2  0.0 - 1.0 mg/dL   Nitrite NEGATIVE  NEGATIVE   Leukocytes, UA NEGATIVE  NEGATIVE  PREGNANCY, URINE     Status: None   Collection Time    09/26/13  1:04 PM      Result Value Ref Range   Preg Test, Ur NEGATIVE  NEGATIVE   Comment:            THE SENSITIVITY OF THIS     METHODOLOGY IS >20 mIU/mL.  URINE RAPID DRUG SCREEN (HOSP PERFORMED)     Status: None   Collection Time    09/26/13  1:04 PM      Result Value Ref Range   Opiates NONE DETECTED  NONE DETECTED   Cocaine NONE DETECTED  NONE DETECTED   Benzodiazepines NONE DETECTED  NONE DETECTED   Amphetamines NONE DETECTED  NONE DETECTED   Tetrahydrocannabinol NONE DETECTED  NONE DETECTED   Barbiturates NONE DETECTED  NONE DETECTED   Comment:            DRUG SCREEN FOR MEDICAL PURPOSES     ONLY.  IF CONFIRMATION IS NEEDED     FOR ANY PURPOSE, NOTIFY LAB     WITHIN 5 DAYS.                LOWEST DETECTABLE LIMITS     FOR URINE DRUG SCREEN     Drug Class       Cutoff (ng/mL)     Amphetamine      1000     Barbiturate      200     Benzodiazepine   103     Tricyclics       159     Opiates          300     Cocaine          300     THC              50  URINE MICROSCOPIC-ADD ON     Status: None   Collection Time    09/26/13  1:04 PM      Result Value Ref Range   WBC, UA 0-2  <3 WBC/hpf   RBC / HPF 3-6  <3 RBC/hpf  BASIC METABOLIC PANEL     Status: Abnormal   Collection Time    09/26/13  2:45 PM      Result Value Ref Range   Sodium 116 (*) 137 - 147 mEq/L   Comment: CRITICAL RESULT CALLED TO, READ BACK BY AND VERIFIED WITH:     PHILLIPS,C ON 09/26/13 AT 1540 BY LOY,C   Potassium 2.8 (*) 3.7 - 5.3 mEq/L   Comment: DELTA CHECK NOTED     CRITICAL RESULT CALLED TO, READ BACK BY AND VERIFIED WITH:     PHILLIPS,C ON 09/26/13 AT 1540 BY LOY,C   Chloride 73 (*) 96 - 112 mEq/L   CO2 31  19 - 32 mEq/L   Glucose, Bld 125 (*) 70 - 99 mg/dL    BUN 6  6 - 23 mg/dL   Creatinine, Ser 0.50  0.50 - 1.10 mg/dL   Calcium 8.0 (*) 8.4 - 10.5 mg/dL   GFR calc non Af Amer >90  >90 mL/min  GFR calc Af Amer >90  >90 mL/min   Comment: (NOTE)     The eGFR has been calculated using the CKD EPI equation.     This calculation has not been validated in all clinical situations.     eGFR's persistently <90 mL/min signify possible Chronic Kidney     Disease.  MAGNESIUM     Status: None   Collection Time    09/26/13  2:45 PM      Result Value Ref Range   Magnesium 2.3  1.5 - 2.5 mg/dL  PHOSPHORUS     Status: None   Collection Time    09/26/13  2:45 PM      Result Value Ref Range   Phosphorus 2.6  2.3 - 4.6 mg/dL  PROTIME-INR     Status: None   Collection Time    09/26/13  2:45 PM      Result Value Ref Range   Prothrombin Time 12.9  11.6 - 15.2 seconds   INR 0.99  0.00 - 1.49  CBC WITH DIFFERENTIAL     Status: None   Collection Time    09/26/13  2:47 PM      Result Value Ref Range   WBC DUPLICATE     RBC DUPLICATE     Hemoglobin DUPLICATE     HCT DUPLICATE     MCV DUPLICATE     MCH DUPLICATE     MCHC DUPLICATE     RDW DUPLICATE     Platelets DUPLICATE     Neutrophils Relative % PENDING     Neutro Abs PENDING     Band Neutrophils PENDING     Lymphocytes Relative PENDING     Lymphs Abs PENDING     Monocytes Relative PENDING     Monocytes Absolute PENDING     Eosinophils Relative PENDING     Eosinophils Absolute PENDING     Basophils Relative PENDING     Basophils Absolute PENDING     LUCs, % PENDING     LUC, Absolute PENDING     WBC Morphology PENDING     RBC Morphology PENDING     Smear Review PENDING     Other PENDING     Other 2 PENDING     nRBC PENDING     Metamyelocytes Relative PENDING     Myelocytes PENDING     Promyelocytes Absolute PENDING     Blasts PENDING      Dg Chest 2 View  09/25/2013   CLINICAL DATA:  Cough  EXAM: CHEST  2 VIEW  COMPARISON:  07/23/2012  FINDINGS: The heart size and mediastinal  contours are within normal limits. Both lungs are clear. The visualized skeletal structures are unremarkable.  IMPRESSION: No active cardiopulmonary disease.   Electronically Signed   By: Franchot Gallo M.D.   On: 09/25/2013 13:09   Ct Head Wo Contrast  09/26/2013   CLINICAL DATA:  Seizure  EXAM: CT HEAD WITHOUT CONTRAST  TECHNIQUE: Contiguous axial images were obtained from the base of the skull through the vertex without intravenous contrast.  COMPARISON:  None.  FINDINGS: The ventricles are normal in size and configuration. There is no mass, hemorrhage, extra-axial fluid collection, or midline shift. There is mild patchy small vessel disease in the centra semiovale bilaterally. Gray-white compartments otherwise appear normal. There is no demonstrable acute infarct.  Bony calvarium appears intact.  The mastoid air cells are clear.  IMPRESSION: Mild patchy small vessel disease in the centra semiovale bilaterally.  No intracranial mass, hemorrhage, or acute appearing infarct.   Electronically Signed   By: Lowella Grip M.D.   On: 09/26/2013 10:22   Dg Chest Portable 1 View  09/26/2013   CLINICAL DATA:  Seizure  EXAM: PORTABLE CHEST - 1 VIEW  COMPARISON:  September 25, 2013  FINDINGS: Lungs are clear. Heart size and pulmonary vascularity are normal. No adenopathy. There is atherosclerotic change in the aorta. No pneumothorax. No bone lesions.  IMPRESSION: No edema or consolidation.   Electronically Signed   By: Lowella Grip M.D.   On: 09/26/2013 09:49   Dg Chest Port 1v Same Day  09/26/2013   CLINICAL DATA:  Central line placement  EXAM: PORTABLE CHEST - 1 VIEW SAME DAY  COMPARISON:  DG CHEST 1V PORT dated 09/26/2013  FINDINGS: There is a right-sided jugular central venous catheter with the tip projecting over the SVC. The heart size and mediastinal contours are within normal limits. Both lungs are clear. The visualized skeletal structures are unremarkable.  IMPRESSION: Right-sided jugular central venous  catheter with the tip projecting over the SVC.   Electronically Signed   By: Kathreen Devoid   On: 09/26/2013 12:51    Review of Systems  Respiratory: Positive for cough and sputum production. Negative for shortness of breath.   Cardiovascular: Negative for palpitations, orthopnea and leg swelling.  Gastrointestinal: Positive for nausea, vomiting and diarrhea.  Neurological: Positive for seizures and weakness.   Blood pressure 142/84, pulse 64, temperature 98.3 F (36.8 C), temperature source Oral, resp. rate 16, height 5' 6"  (1.676 m), weight 104.4 kg (230 lb 2.6 oz), SpO2 100.00%. Physical Exam  Constitutional: No distress.  HENT:  Mouth/Throat: No oropharyngeal exudate.  Eyes: No scleral icterus.  Neck: No JVD present.  Cardiovascular: Normal rate, regular rhythm and normal heart sounds.   No murmur heard. Respiratory: No respiratory distress. She has no wheezes. She has no rales.  GI: She exhibits no distension. There is no tenderness.  Musculoskeletal: She exhibits no edema.    Assessment/Plan: Problem #1 hyponatremia: Most likely secondary to hypovolemic hyponatremia associated with hydrochlorothiazide and hypotonic fluid intake. Problem #2 hypokalemia: This is also associated with HCTZ/nausea and vomiting. Presently her potassium is low but improving. Problem #3 hypomagnesemia Problem #4 nausea and vomiting: Presently seems to be feeling better. A slightly associated medications. Problem #5 hypertension Problem #6 history of seizure disorder Problem #7 history of melanoma. Plan: Her continue with normal saline with KCl at present rate. We'll change her IV fluid to normal saline with 40 mEq of KCl at 105 cc per hour if her sodium increased to about 120 milliequivalents. Continue to check her basic metabolic panel every 2 hours. We'll check urine sodium, urine chloride, urine potassium, urine osmolality and serum osmolality. We'll use 3% saline if her patient has another seizure  or gallop altered mental status.  Heron Pitcock S 09/26/2013, 3:45 PM

## 2013-09-27 LAB — BASIC METABOLIC PANEL
BUN: 5 mg/dL — ABNORMAL LOW (ref 6–23)
BUN: 6 mg/dL (ref 6–23)
BUN: 6 mg/dL (ref 6–23)
BUN: 5 mg/dL — AB (ref 6–23)
BUN: 7 mg/dL (ref 6–23)
BUN: 5 mg/dL — AB (ref 6–23)
CALCIUM: 7.8 mg/dL — AB (ref 8.4–10.5)
CALCIUM: 8 mg/dL — AB (ref 8.4–10.5)
CHLORIDE: 77 meq/L — AB (ref 96–112)
CHLORIDE: 80 meq/L — AB (ref 96–112)
CO2: 31 mEq/L (ref 19–32)
CO2: 30 mEq/L (ref 19–32)
CO2: 29 meq/L (ref 19–32)
CO2: 30 meq/L (ref 19–32)
CO2: 31 mEq/L (ref 19–32)
CO2: 30 meq/L (ref 19–32)
CREATININE: 0.61 mg/dL (ref 0.50–1.10)
CREATININE: 0.52 mg/dL (ref 0.50–1.10)
CREATININE: 0.53 mg/dL (ref 0.50–1.10)
CREATININE: 0.53 mg/dL (ref 0.50–1.10)
Calcium: 7.7 mg/dL — ABNORMAL LOW (ref 8.4–10.5)
Calcium: 7.8 mg/dL — ABNORMAL LOW (ref 8.4–10.5)
Calcium: 7.9 mg/dL — ABNORMAL LOW (ref 8.4–10.5)
Calcium: 8 mg/dL — ABNORMAL LOW (ref 8.4–10.5)
Chloride: 77 mEq/L — ABNORMAL LOW (ref 96–112)
Chloride: 81 mEq/L — ABNORMAL LOW (ref 96–112)
Chloride: 84 mEq/L — ABNORMAL LOW (ref 96–112)
Chloride: 86 mEq/L — ABNORMAL LOW (ref 96–112)
Creatinine, Ser: 0.57 mg/dL (ref 0.50–1.10)
Creatinine, Ser: 0.59 mg/dL (ref 0.50–1.10)
GFR calc Af Amer: >90 mL/min (ref >90)
GFR calc non Af Amer: >90 mL/min (ref >90)
GFR calc non Af Amer: >90 mL/min (ref >90)
GFR calc non Af Amer: >90 mL/min (ref >90)
GLUCOSE: 104 mg/dL — AB (ref 70–99)
Glucose, Bld: 102 mg/dL — ABNORMAL HIGH (ref 70–99)
Glucose, Bld: 102 mg/dL — ABNORMAL HIGH (ref 70–99)
Glucose, Bld: 103 mg/dL — ABNORMAL HIGH (ref 70–99)
Glucose, Bld: 104 mg/dL — ABNORMAL HIGH (ref 70–99)
Glucose, Bld: 111 mg/dL — ABNORMAL HIGH (ref 70–99)
POTASSIUM: 2.5 meq/L — AB (ref 3.7–5.3)
Potassium: 2.9 mEq/L — CL (ref 3.7–5.3)
Potassium: 3.1 mEq/L — ABNORMAL LOW (ref 3.7–5.3)
Potassium: 3 mEq/L — ABNORMAL LOW (ref 3.7–5.3)
Potassium: 3.2 mEq/L — ABNORMAL LOW (ref 3.7–5.3)
Potassium: 3.2 mEq/L — ABNORMAL LOW (ref 3.7–5.3)
SODIUM: 118 meq/L — AB (ref 137–147)
Sodium: 118 mEq/L — CL (ref 137–147)
Sodium: 120 mEq/L — CL (ref 137–147)
Sodium: 120 mEq/L — CL (ref 137–147)
Sodium: 123 mEq/L — ABNORMAL LOW (ref 137–147)
Sodium: 124 mEq/L — ABNORMAL LOW (ref 137–147)

## 2013-09-27 LAB — CORTISOL-PM, BLOOD: Cortisol - PM: 34.1 ug/dL — ABNORMAL HIGH (ref 3.1–16.7)

## 2013-09-27 LAB — OSMOLALITY, URINE: Osmolality, Ur: 407 mOsm/kg (ref 390–1090)

## 2013-09-27 LAB — MAGNESIUM, URINE: Magnesium, Ur: 5.7 mg/dL

## 2013-09-27 LAB — TSH: TSH: 0.341 u[IU]/mL — ABNORMAL LOW (ref 0.350–4.500)

## 2013-09-27 MED ORDER — SODIUM CHLORIDE 0.9 % IV SOLN
INTRAVENOUS | Status: DC
  Filled 2013-09-27: qty 1000

## 2013-09-27 MED ORDER — POTASSIUM CHLORIDE CRYS ER 20 MEQ PO TBCR
30.0000 meq | EXTENDED_RELEASE_TABLET | Freq: Two times a day (BID) | ORAL | Status: DC
  Administered 2013-09-27 – 2013-09-28 (×3): 30 meq via ORAL
  Filled 2013-09-27 (×6): qty 1

## 2013-09-27 MED ORDER — POTASSIUM CHLORIDE CRYS ER 20 MEQ PO TBCR: 40.0000 meq | EXTENDED_RELEASE_TABLET | Freq: Two times a day (BID) | ORAL | Status: DC

## 2013-09-27 MED ORDER — POTASSIUM CHLORIDE IN NACL 40-0.9 MEQ/L-% IV SOLN
INTRAVENOUS | Status: DC
  Administered 2013-09-27 – 2013-09-28 (×2): via INTRAVENOUS

## 2013-09-27 MED ORDER — GUAIFENESIN-DM 100-10 MG/5ML PO SYRP
5.0000 mL | ORAL_SOLUTION | ORAL | Status: DC | PRN
  Administered 2013-09-28 (×2): 5 mL via ORAL
  Filled 2013-09-27 (×3): qty 5

## 2013-09-27 NOTE — Progress Notes (Signed)
Notified MD of Na of 120 as per order.  Patient resting, vitals remain stable

## 2013-09-27 NOTE — Plan of Care (Signed)
Problem: Phase I Progression Outcomes Goal: Seizure activity controlled Outcome: Progressing No seizures noted this evening. Discussed Aura's tonight, patient stated she did feel something before this seizure, she felt like she was going to pass out.  Informed her to stay in tune to those things. Goal: IV access obtained Outcome: Completed/Met Date Met:  09/27/13 Triple IJ Right side Goal: Maintaining airway and VS stable Outcome: Progressing Sinus bradycardia on the monitor Goal: Pain controlled with appropriate interventions Outcome: Not Applicable Date Met:  20/60/15 No complaints of pain Goal: OOB as tolerated unless otherwise ordered Outcome: Progressing To bsc as needed, foley catheter placed for critcal laboratory results Goal: Initial discharge plan identified Outcome: [place] with spouse

## 2013-09-27 NOTE — Progress Notes (Signed)
Subjective: Interval History: has no complaint of seizure. She denies any difficulty breathing and overall she feels better..  Objective: Vital signs in last 24 hours: Temp:  [98 F (36.7 C)-98.5 F (36.9 C)] 98.1 F (36.7 C) (03/20 0800) Pulse Rate:  [52-104] 55 (03/20 0400) Resp:  [14-22] 22 (03/20 0800) BP: (102-158)/(54-110) 104/88 mmHg (03/20 0800) SpO2:  [82 %-100 %] 95 % (03/20 0400) Weight:  [104.4 kg (230 lb 2.6 oz)-105 kg (231 lb 7.7 oz)] 105 kg (231 lb 7.7 oz) (03/20 0500) Weight change:   Intake/Output from previous day: 03/19 0701 - 03/20 0700 In: 3175 [P.O.:720; I.V.:2005; IV Piggyback:450] Out: 1800 [Urine:1800] Intake/Output this shift:    General appearance: alert, cooperative and no distress Resp: clear to auscultation bilaterally Cardio: regular rate and rhythm, S1, S2 normal, no murmur, click, rub or gallop GI: soft, non-tender; bowel sounds normal; no masses,  no organomegaly Extremities: extremities normal, atraumatic, no cyanosis or edema  Lab Results:  Recent Labs  09/26/13 0930 09/26/13 7619  WBC 50.9* DUPLICATE  HGB 32.6 DUPLICATE  HCT 71.2 DUPLICATE  PLT 458 DUPLICATE   BMET:  Recent Labs  09/27/13 0413 09/27/13 0600  NA 120* 120*  K 3.1* 3.0*  CL 80* 81*  CO2 31 30  GLUCOSE 102* 104*  BUN 5* 5*  CREATININE 0.53 0.57  CALCIUM 7.7* 7.8*   No results found for this basename: PTH,  in the last 72 hours Iron Studies: No results found for this basename: IRON, TIBC, TRANSFERRIN, FERRITIN,  in the last 72 hours  Studies/Results: Dg Chest 2 View  09/25/2013   CLINICAL DATA:  Cough  EXAM: CHEST  2 VIEW  COMPARISON:  07/23/2012  FINDINGS: The heart size and mediastinal contours are within normal limits. Both lungs are clear. The visualized skeletal structures are unremarkable.  IMPRESSION: No active cardiopulmonary disease.   Electronically Signed   By: Franchot Gallo M.D.   On: 09/25/2013 13:09   Ct Head Wo Contrast  09/26/2013    CLINICAL DATA:  Seizure  EXAM: CT HEAD WITHOUT CONTRAST  TECHNIQUE: Contiguous axial images were obtained from the base of the skull through the vertex without intravenous contrast.  COMPARISON:  None.  FINDINGS: The ventricles are normal in size and configuration. There is no mass, hemorrhage, extra-axial fluid collection, or midline shift. There is mild patchy small vessel disease in the centra semiovale bilaterally. Gray-white compartments otherwise appear normal. There is no demonstrable acute infarct.  Bony calvarium appears intact.  The mastoid air cells are clear.  IMPRESSION: Mild patchy small vessel disease in the centra semiovale bilaterally. No intracranial mass, hemorrhage, or acute appearing infarct.   Electronically Signed   By: Lowella Grip M.D.   On: 09/26/2013 10:22   Dg Chest Portable 1 View  09/26/2013   CLINICAL DATA:  Seizure  EXAM: PORTABLE CHEST - 1 VIEW  COMPARISON:  September 25, 2013  FINDINGS: Lungs are clear. Heart size and pulmonary vascularity are normal. No adenopathy. There is atherosclerotic change in the aorta. No pneumothorax. No bone lesions.  IMPRESSION: No edema or consolidation.   Electronically Signed   By: Lowella Grip M.D.   On: 09/26/2013 09:49   Dg Chest Port 1v Same Day  09/26/2013   CLINICAL DATA:  Central line placement  EXAM: PORTABLE CHEST - 1 VIEW SAME DAY  COMPARISON:  DG CHEST PORT 1VSAME DAY dated 09/26/2013  FINDINGS: There is a right jugular central venous catheter with the tip projecting over the SVC.  There is no focal parenchymal opacity, pleural effusion, or pneumothorax. The heart and mediastinal contours are unremarkable.  The osseous structures are unremarkable.  IMPRESSION: Right-sided jugular central venous catheter with the tip projecting over the SVC.   Electronically Signed   By: Kathreen Devoid   On: 09/26/2013 16:19   Dg Chest Port 1v Same Day  09/26/2013   CLINICAL DATA:  Central line placement  EXAM: PORTABLE CHEST - 1 VIEW SAME DAY   COMPARISON:  DG CHEST 1V PORT dated 09/26/2013  FINDINGS: There is a right-sided jugular central venous catheter with the tip projecting over the SVC. The heart size and mediastinal contours are within normal limits. Both lungs are clear. The visualized skeletal structures are unremarkable.  IMPRESSION: Right-sided jugular central venous catheter with the tip projecting over the SVC.   Electronically Signed   By: Kathreen Devoid   On: 09/26/2013 12:51    I have reviewed the patient's current medications.  Assessment/Plan: Problem #1 hyponatremia: Seems to be hypovolemic hypernatremia. Her sodium is 120 has improved. And patient is a symptomatic. Her urine chloride, sodium and osmolality is high. Hence possibly associated to HCTZ accompanied with hypotonic fluid intake.  Problem #2 Hypokalemia: Her potassium is 3 has improved but still remains low. Problem #3 hypomagnesemia Problem #4 hypertension her blood pressure is reasonably controlled Problem #5 history of seizure disorder Problem #6 history of melanoma  Problem #7 hyperlipidemia Plan: IV with potassium oral replacement We'll continue his IV fluid at present rate and restrict free  water intake.   LOS: 1 day   Phaedra Colgate S 09/27/2013,8:36 AM

## 2013-09-27 NOTE — Progress Notes (Signed)
Note: This document was prepared with digital dictation and possible smart phrase technology. Any transcriptional errors that result from this process are unintentional.   Sarah Palmer HBZ:[id number] DOB: January 08, 1956 DOA: 09/26/2013 PCP: Florina Ou, MD  Brief narrative: 58 y.o. female with Known prior ETOH use [ quit 20 yrs ago], Known Htn on HCTZ, MOrbid obestiy, Prior smoker till about 2 months agiowho started having URI symptoms 3/14. She empirically took OTC meds and was generally having malaise until 3/19, when she sought med attention at [facility] ED. She was waiting in triage to be seen when she had an episode of generalized tonic-clonic seizure lasting about a minute to minute and a half eyes rolled back, bit her lip, lost urinary continence and was subsequently admitted for the same.  Emergency room workup = sodium 115, potassium 2.4, chloride 66, BUN 8, creatinine 0.6, LFTs normal, point-of-care troponin 0.30  Repeat sodium 114 potassium 2.2 magnesium 1.3  CT head = minimal patchy small vessel disease and 70 over bilaterally no intracranial mass hemorrhage or acute empiric infarct  Chest x-ray = no edema or consolidation   Past medical history-As per Problem list Chart reviewed as below-   Consultants:  Nephrology  Procedures:  See below  Antibiotics:  None   Subjective  Alert pleasant oriented Tolerating diet Good urine output No diarrhea no dysuria no cough cold or fever    Objective    Interim History:   Telemetry: Sinus rhythm   Objective: Filed Vitals:   09/27/13 0300 09/27/13 0400 09/27/13 0500 09/27/13 0800  BP: 140/92 126/65  104/88  Pulse:  55    Temp:  98.4 F (36.9 C)  98.1 F (36.7 C)  TempSrc:  Oral  Oral  Resp: 21 20  22   Height:      Weight:   105 kg (231 lb 7.7 oz)   SpO2:  95%      Intake/Output Summary (Last 24 hours) at 09/27/13 1017 Last data filed at 09/27/13 0500  Gross per 24 hour  Intake   3175 ml  Output   1800 ml  Net    1375 ml    Exam:  General: Alert pleasant Morbid obesity, There is no weight on file to calculate BMI. Eyes: EOMI, NCAT ENT: soft, supple no LAN, trachea midline, no thyromegaly Neck: no bruit, no JVD soft supple Cardiovascular: S1-S2 slightly tachycardic Respiratory: clinically clear no added sound Abdomen: soft nontender no rebound no guarding   Data Reviewed: Basic Metabolic Panel:  Recent Labs Lab 09/26/13 1048 09/26/13 1241 09/26/13 1445  09/26/13 1619  09/26/13 2208 09/27/13 0010 09/27/13 0217 09/27/13 0413 09/27/13 0600  NA 114* 115* 116*  < >  --   < > 118* 118* 118* 120* 120*  K 2.2* 2.3* 2.8*  < >  --   < > 2.3* 2.5* 2.9* 3.1* 3.0*  CL 70* 70* 73*  < >  --   < > 76* 77* 77* 80* 81*  CO2 22 30 31   < >  --   < > 31 30 30 31 30   GLUCOSE 150* 127* 125*  < >  --   < > 107* 104* 103* 102* 104*  BUN 7 7 6   < >  --   < > 7 6 5* 5* 5*  CREATININE 0.49* 0.50 0.50  < >  --   < > 0.53 0.52 0.53 0.53 0.57  CALCIUM 7.9* 8.1* 8.0*  < >  --   < > 7.8*  7.8* 7.9* 7.7* 7.8*  MG 1.3*  --  2.3  --  2.3  --   --   --   --   --   --   PHOS  --   --  2.6  --  2.5  --   --   --   --   --   --   < > = values in this interval not displayed. Liver Function Tests:  Recent Labs Lab 09/26/13 0930  AST 31  ALT 18  ALKPHOS 96  BILITOT 1.0  PROT 7.1  ALBUMIN 3.8   No results found for this basename: LIPASE, AMYLASE,  in the last 168 hours No results found for this basename: AMMONIA,  in the last 168 hours CBC:  Recent Labs Lab 09/26/13 0930 09/26/13 7893  WBC 81.0* DUPLICATE  NEUTROABS 9.7* PENDING  HGB 17.5 DUPLICATE  HCT 10.2 DUPLICATE  MCV 58.5* DUPLICATE  PLT 277 DUPLICATE   Cardiac Enzymes:  Recent Labs Lab 09/26/13 0930  TROPONINI <0.30   BNP: No components found with this basename: POCBNP,  CBG:  Recent Labs Lab 09/26/13 0920  GLUCAP 212*    Recent Results (from the past 240 hour(s))  MRSA PCR SCREENING     Status: None   Collection Time    09/26/13   2:00 PM      Result Value Ref Range Status   MRSA by PCR NEGATIVE  NEGATIVE Final   Comment:            The GeneXpert MRSA Assay (FDA     approved for NASAL specimens     only), is one component of a     comprehensive MRSA colonization     surveillance program. It is not     intended to diagnose MRSA     infection nor to guide or     monitor treatment for     MRSA infections.     Studies:              All Imaging reviewed and is as per above notation   Scheduled Meds: . atenolol  25 mg Oral Daily  . heparin  5,000 Units Subcutaneous 3 times per day  . LORazepam  1 mg Intravenous Once  . sodium chloride  3 mL Intravenous Q12H   Continuous Infusions: . 0.9 % NaCl with KCl 40 mEq / L 50 mL/hr at 09/27/13 0754     Assessment/Plan: 1. Severe symptomatic hyponatremia-resolving slowly from nadir 114. Currently 120, appreciate nephrology input-saline plus KCL rate changed from 125 cc an hour to 100 cc an hour.  Change Chem-7 checks to every 4 hours and monitor.  Likely transfer out of step down later this morning as hemodynamically stable-urine sodium 64, urine potassium 28, urine chloride 67-this indicates that most likely patient has lost the majority of her sodium from urinary losses secondary to thiazide diuretic, as urine sodium is below 100 2.  Seizure-no recurrence. Keep Ativan on standby. Likely was secondary to symptomatic hyponatremia 3. Hypertension moderately controlled on atenolol monotherapy 25 daily. Patient will not be discharged on HCTZ and will discuss further outpatient management with primary care physician 4. Morbid obesity, Body mass index is 37.38 kg/(m^2).-needs outpatient discussion with primary care physician.   Code Status: Full Family Communication:  None +  Disposition Plan: Likely transfer out of SDU today to Tele.  Likely 2-3 more days and willinpatient stay needed   Verneita Griffes, MD  [facility] Pager [phone] 09/27/2013,  8:11 AM    LOS: 1  day

## 2013-09-27 NOTE — ED Provider Notes (Signed)
Medical screening examination/treatment/procedure(s) were performed by non-physician practitioner and as supervising physician I was immediately available for consultation/collaboration.   EKG Interpretation None        Maudry Diego, MD 09/27/13 1113

## 2013-09-28 LAB — BASIC METABOLIC PANEL
BUN: 6 mg/dL (ref 6–23)
CO2: 30 meq/L (ref 19–32)
CREATININE: 0.66 mg/dL (ref 0.50–1.10)
Calcium: 8.1 mg/dL — ABNORMAL LOW (ref 8.4–10.5)
Chloride: 95 mEq/L — ABNORMAL LOW (ref 96–112)
GFR calc Af Amer: >90 mL/min (ref >90)
GFR calc non Af Amer: >90 mL/min (ref >90)
Glucose, Bld: 95 mg/dL (ref 70–99)
Potassium: 3.2 mEq/L — ABNORMAL LOW (ref 3.7–5.3)
Sodium: 133 mEq/L — ABNORMAL LOW (ref 137–147)

## 2013-09-28 MED ORDER — POTASSIUM CHLORIDE CRYS ER 20 MEQ PO TBCR
40.0000 meq | EXTENDED_RELEASE_TABLET | Freq: Two times a day (BID) | ORAL | Status: DC
  Administered 2013-09-28 – 2013-09-29 (×2): 40 meq via ORAL
  Filled 2013-09-28 (×2): qty 2

## 2013-09-28 NOTE — Progress Notes (Signed)
Subjective: Interval History: has no complaint of seizure. She denies any difficulty breathing . Her weakness and strength is better.  Objective: Vital signs in last 24 hours: Temp:  [98 F (36.7 C)-98.7 F (37.1 C)] 98.6 F (37 C) (03/21 0404) Pulse Rate:  [62-66] 66 (03/21 0700) Resp:  [15-29] 20 (03/21 0700) BP: (88-153)/(61-88) 153/75 mmHg (03/21 0700) SpO2:  [96 %] 96 % (03/21 0404) Weight:  [103.5 kg (228 lb 2.8 oz)] 103.5 kg (228 lb 2.8 oz) (03/21 0500) Weight change: -0.9 kg (-1 lb 15.7 oz)  Intake/Output from previous day: 03/20 0701 - 03/21 0700 In: 2229.2 [I.V.:2229.2] Out: 751 [Urine:750; Stool:1] Intake/Output this shift:    General appearance: alert, cooperative and no distress Resp: clear to auscultation bilaterally Cardio: regular rate and rhythm, S1, S2 normal, no murmur, click, rub or gallop GI: soft, non-tender; bowel sounds normal; no masses,  no organomegaly Extremities: extremities normal, atraumatic, no cyanosis or edema  Lab Results:  Recent Labs  09/26/13 0930 09/26/13 5732  WBC 20.2* DUPLICATE  HGB 54.2 DUPLICATE  HCT 70.6 DUPLICATE  PLT 237 DUPLICATE   BMET:   Recent Labs  09/27/13 1629 09/28/13 0430  NA 124* 133*  K 3.2* 3.2*  CL 86* 95*  CO2 29 30  GLUCOSE 102* 95  BUN 7 6  CREATININE 0.61 0.66  CALCIUM 8.0* 8.1*   No results found for this basename: PTH,  in the last 72 hours Iron Studies: No results found for this basename: IRON, TIBC, TRANSFERRIN, FERRITIN,  in the last 72 hours  Studies/Results: Ct Head Wo Contrast  09/26/2013   CLINICAL DATA:  Seizure  EXAM: CT HEAD WITHOUT CONTRAST  TECHNIQUE: Contiguous axial images were obtained from the base of the skull through the vertex without intravenous contrast.  COMPARISON:  None.  FINDINGS: The ventricles are normal in size and configuration. There is no mass, hemorrhage, extra-axial fluid collection, or midline shift. There is mild patchy small vessel disease in the centra  semiovale bilaterally. Gray-white compartments otherwise appear normal. There is no demonstrable acute infarct.  Bony calvarium appears intact.  The mastoid air cells are clear.  IMPRESSION: Mild patchy small vessel disease in the centra semiovale bilaterally. No intracranial mass, hemorrhage, or acute appearing infarct.   Electronically Signed   By: Lowella Grip M.D.   On: 09/26/2013 10:22   Dg Chest Portable 1 View  09/26/2013   CLINICAL DATA:  Seizure  EXAM: PORTABLE CHEST - 1 VIEW  COMPARISON:  September 25, 2013  FINDINGS: Lungs are clear. Heart size and pulmonary vascularity are normal. No adenopathy. There is atherosclerotic change in the aorta. No pneumothorax. No bone lesions.  IMPRESSION: No edema or consolidation.   Electronically Signed   By: Lowella Grip M.D.   On: 09/26/2013 09:49   Dg Chest Port 1v Same Day  09/26/2013   CLINICAL DATA:  Central line placement  EXAM: PORTABLE CHEST - 1 VIEW SAME DAY  COMPARISON:  DG CHEST PORT 1VSAME DAY dated 09/26/2013  FINDINGS: There is a right jugular central venous catheter with the tip projecting over the SVC. There is no focal parenchymal opacity, pleural effusion, or pneumothorax. The heart and mediastinal contours are unremarkable.  The osseous structures are unremarkable.  IMPRESSION: Right-sided jugular central venous catheter with the tip projecting over the SVC.   Electronically Signed   By: Kathreen Devoid   On: 09/26/2013 16:19   Dg Chest Port 1v Same Day  09/26/2013   CLINICAL DATA:  Central  line placement  EXAM: PORTABLE CHEST - 1 VIEW SAME DAY  COMPARISON:  DG CHEST 1V PORT dated 09/26/2013  FINDINGS: There is a right-sided jugular central venous catheter with the tip projecting over the SVC. The heart size and mediastinal contours are within normal limits. Both lungs are clear. The visualized skeletal structures are unremarkable.  IMPRESSION: Right-sided jugular central venous catheter with the tip projecting over the SVC.   Electronically  Signed   By: Kathreen Devoid   On: 09/26/2013 12:51    I have reviewed the patient'Palmer current medications.  Assessment/Plan: Problem #1 hyponatremia: Seems to be hypovolemic hypernatremia. Her sodium is 133 has improved. And patient is asymptomatic.  Problem #2 Hypokalemia: Her potassium is 3.2 has improved but still remains low. Problem #3 hypomagnesemia. Her magnesium is with in normal range Problem #4 hypertension her blood pressure is reasonably controlled Problem #5 history of seizure disorder. No seizure activity since her admission. Problem #6 history of melanoma  Problem #7 hyperlipidemia Plan: D/c IV Increase her po potasssium to 40 meq po now and give another dose after 6 hours    LOS: 2 days   Sarah Palmer 09/28/2013,7:55 AM

## 2013-09-28 NOTE — Progress Notes (Signed)
Note: This document was prepared with digital dictation and possible smart phrase technology. Any transcriptional errors that result from this process are unintentional.   Sarah Palmer PPJ:[id number] DOB: Sep 22, 1955 DOA: 09/26/2013 PCP: Florina Ou, MD  Brief narrative: 58 y.o. female with Known prior ETOH use [ quit 20 yrs ago], Known Htn on HCTZ, MOrbid obestiy, Prior smoker till about 2 months ago who started having URI symptoms 3/14. She empirically took OTC meds and was generally having malaise until 3/19, when she sought med attention at [facility] ED. She was waiting in triage to be seen when she had an episode of generalized tonic-clonic seizure lasting about a minute to minute and a half eyes rolled back, bit her lip, lost urinary continence and was subsequently admitted for the same.  Emergency room workup = sodium 115, potassium 2.4, chloride 66, BUN 8, creatinine 0.6, LFTs normal, point-of-care troponin 0.30  Repeat sodium 114 potassium 2.2 magnesium 1.3  CT head = minimal patchy small vessel disease and 70 over bilaterally no intracranial mass hemorrhage or acute empiric infarct  Chest x-ray = no edema or consolidation   Past medical history-As per Problem list Chart reviewed as below-   Consultants:  Nephrology  Procedures:  See below  Antibiotics:  None   Subjective   Doing very well. Felt a little "funny" on Robitussin given overnight for cough Still a little congested. NO chills, fever NO cough    Objective    Interim History:   Telemetry: Sinus rhythm   Objective: Filed Vitals:   09/27/13 2000 09/28/13 0404 09/28/13 0500 09/28/13 0700  BP: 121/73 132/66  153/75  Pulse:  62  66  Temp: 98 F (36.7 C) 98.6 F (37 C)    TempSrc: Oral Oral    Resp:  20  20  Height:      Weight:   103.5 kg (228 lb 2.8 oz)   SpO2:  96%      Intake/Output Summary (Last 24 hours) at 09/28/13 5809 Last data filed at 09/28/13 9833  Gross per 24 hour  Intake 2229.17  ml  Output    350 ml  Net 1879.17 ml    Exam:  General: Alert pleasant Morbid obesity, There is no weight on file to calculate BMI. Eyes: EOMI, NCAT ENT: soft, supple no LAN, trachea midline, no thyromegaly Neck: no bruit, no JVD soft supple Cardiovascular: S1-S2 slightly tachycardic Respiratory: clinically clear  Abdomen: soft nontender no rebound no guarding   Data Reviewed: Basic Metabolic Panel:  Recent Labs Lab 09/26/13 1048 09/26/13 1241 09/26/13 1445  09/26/13 1619  09/27/13 0413 09/27/13 0600 09/27/13 1211 09/27/13 1629 09/28/13 0430  NA 114* 115* 116*  < >  --   < > 120* 120* 123* 124* 133*  K 2.2* 2.3* 2.8*  < >  --   < > 3.1* 3.0* 3.2* 3.2* 3.2*  CL 70* 70* 73*  < >  --   < > 80* 81* 84* 86* 95*  CO2 22 30 31   < >  --   < > 31 30 31 29 30   GLUCOSE 150* 127* 125*  < >  --   < > 102* 104* 111* 102* 95  BUN 7 7 6   < >  --   < > 5* 5* 6 7 6   CREATININE 0.49* 0.50 0.50  < >  --   < > 0.53 0.57 0.59 0.61 0.66  CALCIUM 7.9* 8.1* 8.0*  < >  --   < >  7.7* 7.8* 8.0* 8.0* 8.1*  MG 1.3*  --  2.3  --  2.3  --   --   --   --   --   --   PHOS  --   --  2.6  --  2.5  --   --   --   --   --   --   < > = values in this interval not displayed. Liver Function Tests:  Recent Labs Lab 09/26/13 0930  AST 31  ALT 18  ALKPHOS 96  BILITOT 1.0  PROT 7.1  ALBUMIN 3.8   No results found for this basename: LIPASE, AMYLASE,  in the last 168 hours No results found for this basename: AMMONIA,  in the last 168 hours CBC:  Recent Labs Lab 09/26/13 0930 09/26/13 1610  WBC 96.0* DUPLICATE  NEUTROABS 9.7* PENDING  HGB 45.4 DUPLICATE  HCT 09.8 DUPLICATE  MCV 11.9* DUPLICATE  PLT 147 DUPLICATE   Cardiac Enzymes:  Recent Labs Lab 09/26/13 0930  TROPONINI <0.30   BNP: No components found with this basename: POCBNP,  CBG:  Recent Labs Lab 09/26/13 0920  GLUCAP 212*    Recent Results (from the past 240 hour(s))  MRSA PCR SCREENING     Status: None   Collection Time     09/26/13  2:00 PM      Result Value Ref Range Status   MRSA by PCR NEGATIVE  NEGATIVE Final   Comment:            The GeneXpert MRSA Assay (FDA     approved for NASAL specimens     only), is one component of a     comprehensive MRSA colonization     surveillance program. It is not     intended to diagnose MRSA     infection nor to guide or     monitor treatment for     MRSA infections.     Studies:              All Imaging reviewed and is as per above notation   Scheduled Meds: . atenolol  25 mg Oral Daily  . heparin  5,000 Units Subcutaneous 3 times per day  . LORazepam  1 mg Intravenous Once  . potassium chloride  40 mEq Oral BID  . sodium chloride  3 mL Intravenous Q12H   Continuous Infusions:     Assessment/Plan: 1. Severe symptomatic hypovolemic hyponatremia-resolving slowly from nadir 114. Currently 33 appreciate nephrology input-salinhe locked Change Chem-7 checks to every 24 hrs. Urine studies indicate  Renal?GI causes for loss of both K and Na 2.  Seizure-no recurrence. Keep Ativan on standby. Likely was secondary to symptomatic hyponatremia.  No further work-up 3. Hypertension moderately controlled on atenolol monotherapy 25 daily. Patient will not be discharged on HCTZ and will discuss further outpatient management with primary care physician 4. Morbid obesity, Body mass index is 36.85 kg/(m^2).-needs outpatient discussion with primary care physician. 5. Impaired glucose tolerance-out-patient a1c   Code Status: Full Family Communication:  None +  Disposition Plan: Likely d/c am.  Replace K aggressively   Verneita Griffes, MD  [facility] Pager [phone] 09/28/2013, 9:38 AM    LOS: 2 days

## 2013-09-28 NOTE — Progress Notes (Signed)
Pt a/o.vss. No complaints of any distress. Up ad lib. Report called to Lattie Haw, Therapist, sports. Pt to be transferred via wheelchair to room 322.

## 2013-09-29 LAB — RENAL FUNCTION PANEL
Albumin: 3.3 g/dL — ABNORMAL LOW (ref 3.5–5.2)
BUN: 8 mg/dL (ref 6–23)
CALCIUM: 8.8 mg/dL (ref 8.4–10.5)
CO2: 28 mEq/L (ref 19–32)
CREATININE: 0.58 mg/dL (ref 0.50–1.10)
Chloride: 99 mEq/L (ref 96–112)
GFR calc non Af Amer: >90 mL/min (ref >90)
GLUCOSE: 99 mg/dL (ref 70–99)
Phosphorus: 2.7 mg/dL (ref 2.3–4.6)
Potassium: 3.5 mEq/L — ABNORMAL LOW (ref 3.7–5.3)
Sodium: 138 mEq/L (ref 137–147)

## 2013-09-29 MED ORDER — ATENOLOL 25 MG PO TABS: 25.0000 mg | ORAL_TABLET | Freq: Every day | ORAL | Status: DC

## 2013-09-29 MED ORDER — AMLODIPINE BESYLATE 5 MG PO TABS: 5.0000 mg | ORAL_TABLET | Freq: Every day | ORAL | Status: DC

## 2013-09-29 MED ORDER — ATENOLOL 25 MG PO TABS
25.0000 mg | ORAL_TABLET | Freq: Every day | ORAL | Status: DC
  Administered 2013-09-29: 25 mg via ORAL
  Filled 2013-09-29: qty 1

## 2013-09-29 MED ORDER — POTASSIUM CHLORIDE CRYS ER 20 MEQ PO TBCR: 40.0000 meq | EXTENDED_RELEASE_TABLET | Freq: Two times a day (BID) | ORAL | Status: DC

## 2013-09-29 MED ORDER — AMLODIPINE BESYLATE 5 MG PO TABS
5.0000 mg | ORAL_TABLET | Freq: Every day | ORAL | Status: DC
  Administered 2013-09-29: 5 mg via ORAL
  Filled 2013-09-29: qty 1

## 2013-09-29 MED ORDER — ATENOLOL 25 MG PO TABS
50.0000 mg | ORAL_TABLET | Freq: Every day | ORAL | Status: DC
Start: 2013-09-29 — End: 2013-09-29

## 2013-09-29 MED ORDER — ATENOLOL 25 MG PO TABS
100.0000 mg | ORAL_TABLET | Freq: Every day | ORAL | Status: DC
Start: 2013-09-29 — End: 2013-09-29

## 2013-09-29 MED ORDER — ATENOLOL 25 MG PO TABS: 50.0000 mg | ORAL_TABLET | Freq: Every day | ORAL | Status: DC

## 2013-09-29 NOTE — Discharge Summary (Signed)
Physician Discharge Summary  Sarah Palmer KNL:[id number] DOB: 09-Oct-1955 DOA: 09/26/2013  PCP: Florina Ou, MD  Admit date: 09/26/2013 Discharge date: 09/29/2013  Time spent: 35 minutes  Recommendations for Outpatient Follow-up:  1. Renal panel one week 2. Htn meds changed 3. Please do not use thiazides to control blood pressure 4. Suggest weight loss counseling as an outpatient 5. Regular preventive care including A1c, lipid panel as per PCP   Discharge Diagnoses:  Principal Problem:   Hyponatremia Active Problems:   Hyperlipidemia   Hypertension   Chest pain   Melanoma   History of alcohol abuse   Seizure   Discharge Condition: Stable  Diet recommendation: Regular  Filed Weights   09/26/13 1327 09/27/13 0500 09/28/13 0500  Weight: 104.4 kg (230 lb 2.6 oz) 105 kg (231 lb 7.7 oz) 103.5 kg (228 lb 2.8 oz)    History of present illness:  58 y.o. female with Known prior ETOH use [ quit 20 yrs ago], Known Htn on HCTZ, MOrbid obestiy, Prior smoker till about 2 months ago who started having URI symptoms 3/14. She empirically took OTC meds and was generally having malaise until 3/19, when she sought med attention at [facility] ED.  She was waiting in triage to be seen when she had an episode of generalized tonic-clonic seizure lasting about a minute to minute and a half eyes rolled back, bit her lip, lost urinary continence and was subsequently admitted for the same.  Emergency room workup = sodium 115, potassium 2.4, chloride 66, BUN 8, creatinine 0.6, LFTs normal, point-of-care troponin 0.30  Repeat sodium 114 potassium 2.2 magnesium 1.3  CT head = minimal patchy small vessel disease and 70 over bilaterally no intracranial mass hemorrhage or acute empiric infarct  Chest x-ray = no edema or consolidation   Hospital Course:   1. Severe symptomatic hypovolemic hyponatremia-resolving slowly from nadir 114. Discharge sodium 138-will need outpatient nephrology input-Urine studies indicate  Renal?GI causes for loss of both K and Na 2. Seizure-no recurrence.  Likely was secondary to symptomatic hyponatremia. No further work-up 3. Hypertension poorly controlled on atenolol monotherapy 25 daily. Added Norvasc 5 mg on d/c 4. Morbid obesity, Body mass index is 36.85 kg/(m^2).-needs outpatient discussion with primary care physician. 5. Impaired glucose tolerance-out-patient a1c  Consultants:  Nephrology Procedures:  See below Antibiotics:  None  Discharge Exam: Filed Vitals:   09/29/13 0618  BP: 171/108  Pulse: 83  Temp: 98.1 F (36.7 C)  Resp: 20    General: EOMI, NCAT Cardiovascular:  S1 s2 no m/r/g Respiratory: clear  Discharge Instructions  Follow-up Information   Follow up with Florina Ou, MD. Schedule an appointment as soon as possible for a visit in 1 week.   Specialty:  Family Medicine   Contact information:   [address] [place] [address] [phone]       Follow up with [facility] S, MD. (if you really want to-there is no real need to)    Specialty:  Nephrology   Contact information:   [address] [place] [address] [phone]          Medication List    STOP taking these medications       hydrochlorothiazide 12.5 MG capsule  Commonly known as:  MICROZIDE      TAKE these medications       aspirin 81 MG tablet  Take 81 mg by mouth daily.     atenolol 25 MG tablet  Commonly known  as:  TENORMIN  Take 2 tablets (50 mg total) by mouth daily.     azithromycin 250 MG tablet  Commonly known as:  ZITHROMAX  Take 1 tablet (250 mg total) by mouth daily. Take first 2 tablets together, then 1 every day until finished.     benzonatate 200 MG capsule  Commonly known as:  TESSALON  Take 1 capsule (200 mg total) by mouth 3 (three) times daily as needed for cough.     FLAX SEED OIL PO  Take 1 tablet by mouth daily.     omega-3 acid ethyl esters 1 G capsule  Commonly known as:   LOVAZA  Take 1 g by mouth daily.     potassium chloride SA 20 MEQ tablet  Commonly known as:  K-DUR,KLOR-CON  Take 2 tablets (40 mEq total) by mouth 2 (two) times daily.       Allergies  Allergen Reactions  . Penicillins       The results of significant diagnostics from this hospitalization (including imaging, microbiology, ancillary and laboratory) are listed below for reference.    Significant Diagnostic Studies: Dg Chest 2 View  09/25/2013   CLINICAL DATA:  Cough  EXAM: CHEST  2 VIEW  COMPARISON:  07/23/2012  FINDINGS: The heart size and mediastinal contours are within normal limits. Both lungs are clear. The visualized skeletal structures are unremarkable.  IMPRESSION: No active cardiopulmonary disease.   Electronically Signed   By: Franchot Gallo M.D.   On: 09/25/2013 13:09   Ct Head Wo Contrast  09/26/2013   CLINICAL DATA:  Seizure  EXAM: CT HEAD WITHOUT CONTRAST  TECHNIQUE: Contiguous axial images were obtained from the base of the skull through the vertex without intravenous contrast.  COMPARISON:  None.  FINDINGS: The ventricles are normal in size and configuration. There is no mass, hemorrhage, extra-axial fluid collection, or midline shift. There is mild patchy small vessel disease in the centra semiovale bilaterally. Gray-white compartments otherwise appear normal. There is no demonstrable acute infarct.  Bony calvarium appears intact.  The mastoid air cells are clear.  IMPRESSION: Mild patchy small vessel disease in the centra semiovale bilaterally. No intracranial mass, hemorrhage, or acute appearing infarct.   Electronically Signed   By: Lowella Grip M.D.   On: 09/26/2013 10:22   Dg Chest Portable 1 View  09/26/2013   CLINICAL DATA:  Seizure  EXAM: PORTABLE CHEST - 1 VIEW  COMPARISON:  September 25, 2013  FINDINGS: Lungs are clear. Heart size and pulmonary vascularity are normal. No adenopathy. There is atherosclerotic change in the aorta. No pneumothorax. No bone lesions.   IMPRESSION: No edema or consolidation.   Electronically Signed   By: Lowella Grip M.D.   On: 09/26/2013 09:49   Dg Chest Port 1v Same Day  09/26/2013   CLINICAL DATA:  Central line placement  EXAM: PORTABLE CHEST - 1 VIEW SAME DAY  COMPARISON:  DG CHEST PORT 1VSAME DAY dated 09/26/2013  FINDINGS: There is a right jugular central venous catheter with the tip projecting over the SVC. There is no focal parenchymal opacity, pleural effusion, or pneumothorax. The heart and mediastinal contours are unremarkable.  The osseous structures are unremarkable.  IMPRESSION: Right-sided jugular central venous catheter with the tip projecting over the SVC.   Electronically Signed   By: Kathreen Devoid   On: 09/26/2013 16:19   Dg Chest Port 1v Same Day  09/26/2013   CLINICAL DATA:  Central line placement  EXAM: PORTABLE CHEST - 1  VIEW SAME DAY  COMPARISON:  DG CHEST 1V PORT dated 09/26/2013  FINDINGS: There is a right-sided jugular central venous catheter with the tip projecting over the SVC. The heart size and mediastinal contours are within normal limits. Both lungs are clear. The visualized skeletal structures are unremarkable.  IMPRESSION: Right-sided jugular central venous catheter with the tip projecting over the SVC.   Electronically Signed   By: Kathreen Devoid   On: 09/26/2013 12:51    Microbiology: Recent Results (from the past 240 hour(s))  MRSA PCR SCREENING     Status: None   Collection Time    09/26/13  2:00 PM      Result Value Ref Range Status   MRSA by PCR NEGATIVE  NEGATIVE Final   Comment:            The GeneXpert MRSA Assay (FDA     approved for NASAL specimens     only), is one component of a     comprehensive MRSA colonization     surveillance program. It is not     intended to diagnose MRSA     infection nor to guide or     monitor treatment for     MRSA infections.     Labs: Basic Metabolic Panel:  Recent Labs Lab 09/26/13 1048 09/26/13 1241 09/26/13 1445  09/26/13 1619   09/27/13 0600 09/27/13 1211 09/27/13 1629 09/28/13 0430 09/29/13 0740  NA 114* 115* 116*  < >  --   < > 120* 123* 124* 133* 138  K 2.2* 2.3* 2.8*  < >  --   < > 3.0* 3.2* 3.2* 3.2* 3.5*  CL 70* 70* 73*  < >  --   < > 81* 84* 86* 95* 99  CO2 22 30 31   < >  --   < > 30 31 29 30 28   GLUCOSE 150* 127* 125*  < >  --   < > 104* 111* 102* 95 99  BUN 7 7 6   < >  --   < > 5* 6 7 6 8   CREATININE 0.49* 0.50 0.50  < >  --   < > 0.57 0.59 0.61 0.66 0.58  CALCIUM 7.9* 8.1* 8.0*  < >  --   < > 7.8* 8.0* 8.0* 8.1* 8.8  MG 1.3*  --  2.3  --  2.3  --   --   --   --   --   --   PHOS  --   --  2.6  --  2.5  --   --   --   --   --  2.7  < > = values in this interval not displayed. Liver Function Tests:  Recent Labs Lab 09/26/13 0930 09/29/13 0740  AST 31  --   ALT 18  --   ALKPHOS 96  --   BILITOT 1.0  --   PROT 7.1  --   ALBUMIN 3.8 3.3*   No results found for this basename: LIPASE, AMYLASE,  in the last 168 hours No results found for this basename: AMMONIA,  in the last 168 hours CBC:  Recent Labs Lab 09/26/13 0930 09/26/13 [id number]  WBC 99991111* DUPLICATE  NEUTROABS 9.7* PENDING  HGB 123456 DUPLICATE  HCT A999333 DUPLICATE  MCV XX123456* DUPLICATE  PLT 123XX123 DUPLICATE   Cardiac Enzymes:  Recent Labs Lab 09/26/13 0930  TROPONINI <0.30   BNP: BNP (last 3 results)  Recent Labs  09/26/13 1241  PROBNP  711.4*   CBG:  Recent Labs Lab 09/26/13 0920  GLUCAP 212*       SignedNita Sells  [facility] 09/29/2013, 9:03 AM

## 2013-09-29 NOTE — Progress Notes (Signed)
Discharge instructions given, verbalized understanding, out in stable condition ambulatory. 

## 2013-09-29 NOTE — Progress Notes (Signed)
Subjective: Interval History: has no complaint . She denies any difficulty breathing . Her weakness and strength is better.  Objective: Vital signs in last 24 hours: Temp:  [98.1 F (36.7 C)-98.3 F (36.8 C)] 98.1 F (36.7 C) (03/22 0618) Pulse Rate:  [64-83] 83 (03/22 0618) Resp:  [20] 20 (03/22 0618) BP: (133-171)/(75-108) 171/108 mmHg (03/22 0618) SpO2:  [98 %] 98 % (03/22 0618) Weight change:   Intake/Output from previous day: 03/21 0701 - 03/22 0700 In: 240 [P.O.:240] Out: -  Intake/Output this shift:    General appearance: alert, cooperative and no distress Resp: clear to auscultation bilaterally Cardio: regular rate and rhythm, S1, S2 normal, no murmur, click, rub or gallop GI: soft, non-tender; bowel sounds normal; no masses,  no organomegaly Extremities: extremities normal, atraumatic, no cyanosis or edema  Lab Results:  Recent Labs  09/26/13 0930 09/26/13 6967  WBC 89.3* DUPLICATE  HGB 81.0 DUPLICATE  HCT 17.5 DUPLICATE  PLT 102 DUPLICATE   BMET:   Recent Labs  09/28/13 0430 09/29/13 0740  NA 133* 138  K 3.2* 3.5*  CL 95* 99  CO2 30 28  GLUCOSE 95 99  BUN 6 8  CREATININE 0.66 0.58  CALCIUM 8.1* 8.8   No results found for this basename: PTH,  in the last 72 hours Iron Studies: No results found for this basename: IRON, TIBC, TRANSFERRIN, FERRITIN,  in the last 72 hours  Studies/Results: No results found.  I have reviewed the patient's current medications.  Assessment/Plan: Problem #1 hyponatremia: Seems to be hypovolemic hypernatremia. Her sodium is 138 has corrected  And patient is asymptomatic.  Problem #2 Hypokalemia: Her potassium is 3.5 has improved and low normal Problem #3 hypomagnesemia. Her magnesium is with in normal range Problem #4 hypertension her blood pressure : Her blood pressure is controlled Problem #5 history of seizure disorder. No seizure activity since her admission. Problem #6 history of melanoma  Problem #7  hyperlipidemia Plan: D/C [facility]  Increase her Tenormin to 100 mg po once a day Since her hyponatremia has corrected I will sign off Thank you for letting me participate in her care    LOS: 3 days   Inita Uram S 09/29/2013,9:05 AM

## 2014-07-26 IMAGING — CR DG CHEST 1V PORT SAME DAY
1 series · 1 of 1 positions shown · non-contrast
Comparison: DG CHEST 1V PORT dated 09/26/2013

CLINICAL DATA: Central line placement

EXAM:
PORTABLE CHEST - 1 VIEW SAME DAY

[portable]
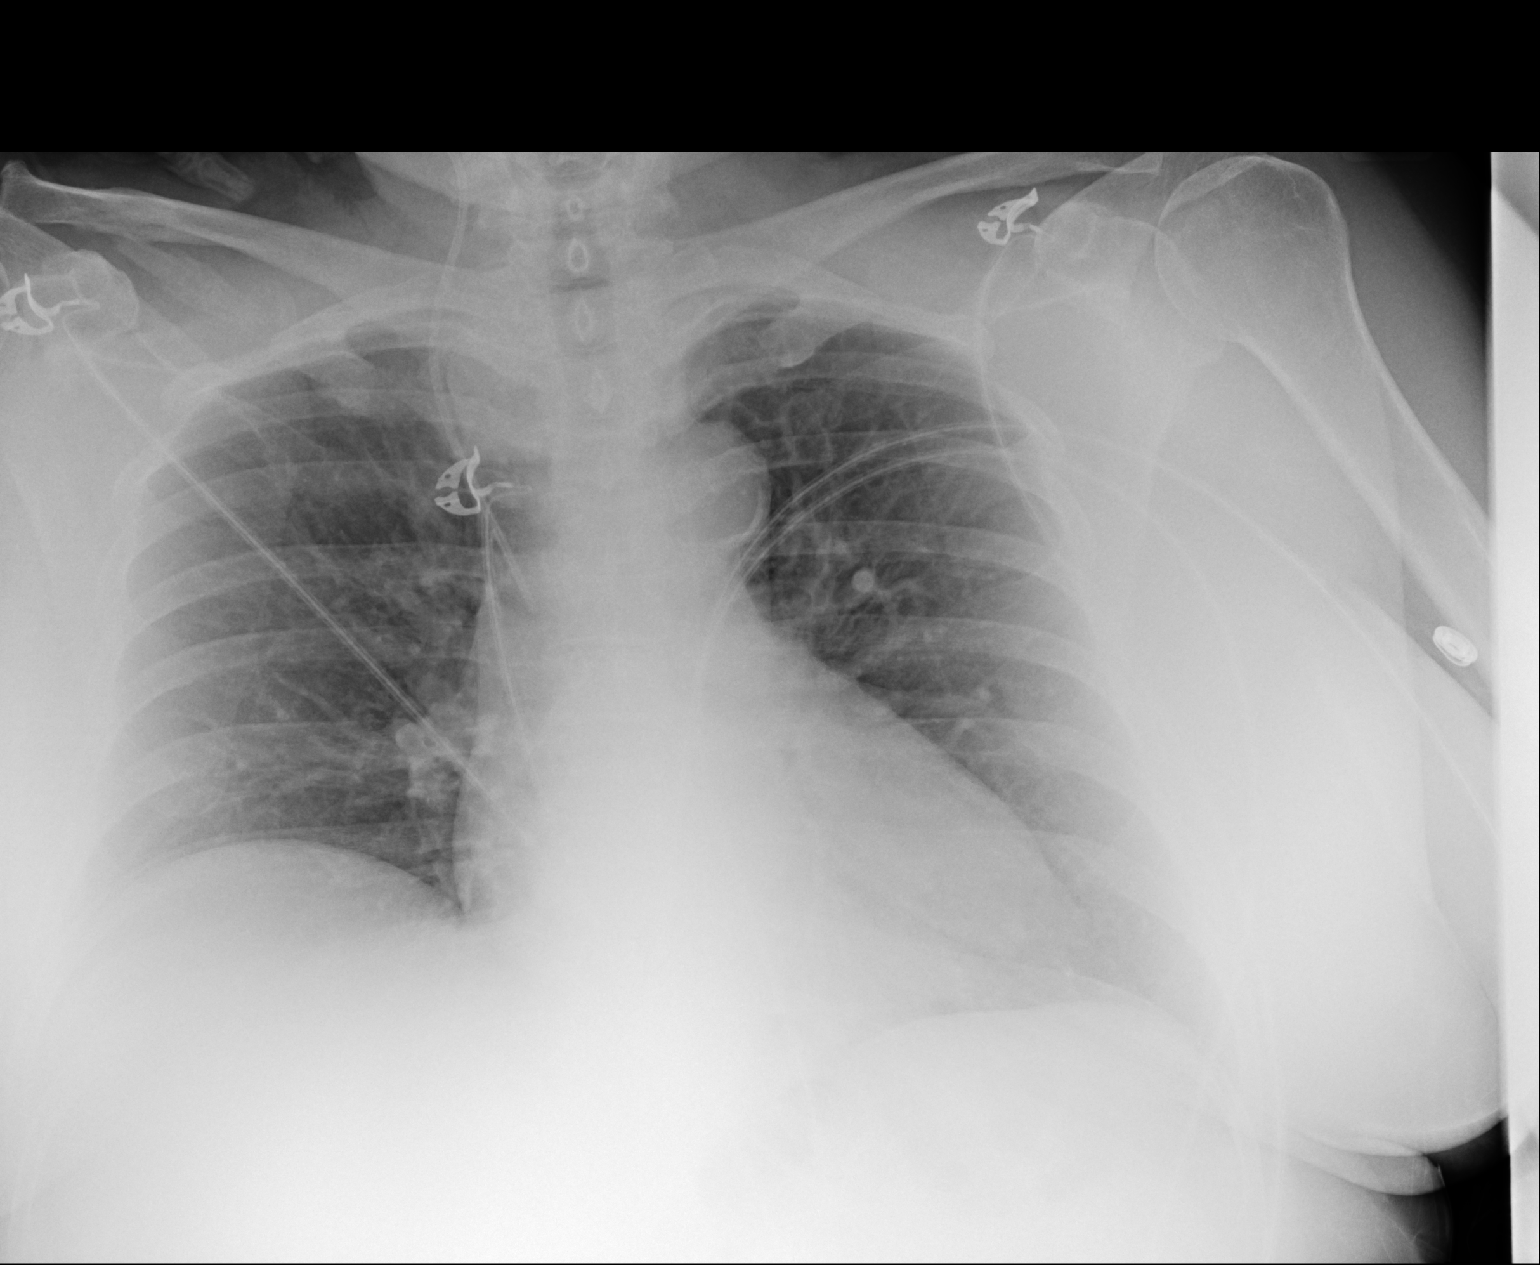

[1 of 1 positions shown; findings below may reference images not displayed]

FINDINGS: There is a right-sided jugular central venous catheter with the tip
projecting over the SVC. The heart size and mediastinal contours are
within normal limits. Both lungs are clear. The visualized skeletal
structures are unremarkable.
IMPRESSION: Right-sided jugular central venous catheter with the tip projecting
over the SVC.

## 2015-07-12 HISTORY — PX: MIDDLE EAR SURGERY: SHX713

## 2016-06-03 ENCOUNTER — Encounter (HOSPITAL_COMMUNITY): Payer: Self-pay | Admitting: *Deleted

## 2016-06-03 ENCOUNTER — Emergency Department (HOSPITAL_COMMUNITY)
Admission: EM | Admit: 2016-06-03 | Discharge: 2016-06-03 | Disposition: A | Discharge status: Home / Self Care | Payer: 59 | Attending: Emergency Medicine | Admitting: Emergency Medicine

## 2016-06-03 DIAGNOSIS — I10 Essential (primary) hypertension: Secondary | ICD-10-CM | POA: Insufficient documentation

## 2016-06-03 DIAGNOSIS — Z87891 Personal history of nicotine dependence: Secondary | ICD-10-CM | POA: Diagnosis not present

## 2016-06-03 DIAGNOSIS — Z7982 Long term (current) use of aspirin: Secondary | ICD-10-CM | POA: Diagnosis not present

## 2016-06-03 DIAGNOSIS — Z79899 Other long term (current) drug therapy: Secondary | ICD-10-CM | POA: Diagnosis not present

## 2016-06-03 DIAGNOSIS — R630 Anorexia: Secondary | ICD-10-CM | POA: Diagnosis not present

## 2016-06-03 DIAGNOSIS — E860 Dehydration: Secondary | ICD-10-CM | POA: Diagnosis not present

## 2016-06-03 DIAGNOSIS — E871 Hypo-osmolality and hyponatremia: Secondary | ICD-10-CM

## 2016-06-03 DIAGNOSIS — R51 Headache: Secondary | ICD-10-CM | POA: Diagnosis present

## 2016-06-03 HISTORY — DX: Gastro-esophageal reflux disease without esophagitis: K21.9

## 2016-06-03 LAB — CBC WITH DIFFERENTIAL/PLATELET
Basophils Absolute: 0.1 10*3/uL (ref 0.0–0.1)
Basophils Relative: 0 %
EOS PCT: 1 %
Eosinophils Absolute: 0.1 10*3/uL (ref 0.0–0.7)
HCT: 41.3 % (ref 36.0–46.0)
Hemoglobin: 14.3 g/dL (ref 12.0–15.0)
Lymphocytes Relative: 19 %
Lymphs Abs: 2.9 10*3/uL (ref 0.7–4.0)
MCH: 27.7 pg (ref 26.0–34.0)
MCHC: 34.6 g/dL (ref 30.0–36.0)
MCV: 79.9 fL (ref 78.0–100.0)
MONO ABS: 1.2 10*3/uL — AB (ref 0.1–1.0)
MONOS PCT: 8 %
Neutro Abs: 11.1 10*3/uL — ABNORMAL HIGH (ref 1.7–7.7)
Neutrophils Relative %: 72 %
PLATELETS: 296 10*3/uL (ref 150–400)
RBC: 5.17 MIL/uL — ABNORMAL HIGH (ref 3.87–5.11)
RDW: 13.8 % (ref 11.5–15.5)
WBC: 15.4 10*3/uL — ABNORMAL HIGH (ref 4.0–10.5)

## 2016-06-03 LAB — COMPREHENSIVE METABOLIC PANEL
ALBUMIN: 4.1 g/dL (ref 3.5–5.0)
ALT: 15 U/L (ref 14–54)
AST: 18 U/L (ref 15–41)
Alkaline Phosphatase: 99 U/L (ref 38–126)
Anion gap: 11 (ref 5–15)
BILIRUBIN TOTAL: 0.8 mg/dL (ref 0.3–1.2)
BUN: 8 mg/dL (ref 6–20)
CO2: 22 mmol/L (ref 22–32)
Calcium: 8.9 mg/dL (ref 8.9–10.3)
Chloride: 91 mmol/L — ABNORMAL LOW (ref 101–111)
Creatinine, Ser: 0.52 mg/dL (ref 0.44–1.00)
GFR calc Af Amer: >60 mL/min (ref >60)
GFR calc non Af Amer: >60 mL/min (ref >60)
GLUCOSE: 121 mg/dL — AB (ref 65–99)
POTASSIUM: 3.4 mmol/L — AB (ref 3.5–5.1)
Sodium: 124 mmol/L — ABNORMAL LOW (ref 135–145)
TOTAL PROTEIN: 7.2 g/dL (ref 6.5–8.1)

## 2016-06-03 MED ORDER — POTASSIUM CHLORIDE CRYS ER 20 MEQ PO TBCR
40.0000 meq | EXTENDED_RELEASE_TABLET | Freq: Once | ORAL | Status: AC
  Administered 2016-06-03: 40 meq via ORAL
  Filled 2016-06-03: qty 2

## 2016-06-03 MED ORDER — ONDANSETRON HCL 4 MG/2ML IJ SOLN
4.0000 mg | Freq: Once | INTRAMUSCULAR | Status: AC
  Administered 2016-06-03: 4 mg via INTRAVENOUS
  Filled 2016-06-03: qty 2

## 2016-06-03 MED ORDER — SODIUM CHLORIDE 0.9 % IV BOLUS (SEPSIS)
1000.0000 mL | Freq: Once | INTRAVENOUS | Status: AC
  Administered 2016-06-03: 1000 mL via INTRAVENOUS

## 2016-06-03 MED ORDER — SODIUM CHLORIDE 0.9 % IV BOLUS (SEPSIS)
500.0000 mL | Freq: Once | INTRAVENOUS | Status: AC
  Administered 2016-06-03: 500 mL via INTRAVENOUS

## 2016-06-03 MED ORDER — CLONIDINE HCL 0.1 MG PO TABS
0.1000 mg | ORAL_TABLET | Freq: Once | ORAL | Status: AC
  Administered 2016-06-03: 0.1 mg via ORAL
  Filled 2016-06-03: qty 1

## 2016-06-03 NOTE — ED Notes (Signed)
Pharmacy notified of need for potassium in pyxis.

## 2016-06-03 NOTE — ED Triage Notes (Signed)
Pt c/o headache that started yesterday; pt states she feels like she has congestion in her head and states she started having a productive cough with green sputum

## 2016-06-03 NOTE — Discharge Instructions (Signed)
Drink more sports drinks rather than just water or your potassium will continue to fall. Have your doctor recheck your electrolytes this week.  Return to the ED if you start feeling worse again.

## 2016-06-03 NOTE — ED Notes (Addendum)
Patient states that she feels like there is fluid shifting in her head and it happened once before and her sodium and potassium was messed up, causing a seizure.  Patient also states that she took her blood pressure medication this morning around 4 am

## 2016-06-03 NOTE — ED Provider Notes (Signed)
[place] DEPT Provider Note   CSN: [id number] Arrival date & time: 06/03/16  0449  Time seen 05:25 AM   History   Chief Complaint Chief Complaint  Patient presents with  . Headache    HPI Sarah Palmer is a 60 y.o. female.  HPI  patient states November 20 she started coughing up green mucus and had some mild clear rhinorrhea. She denies sneezing or sore throat. She states she started taking Mucinex DM and in the past after taking it for 2 or 3 days a starts making her feel weird. She states it makes her feel like she's taking a narcotic. She states she has quit eating this week although she is drinking lots of water. She states she has "nausea in my head", she also feels like the muscles in her legs have dried up. She also describes feeling like she has rashes in her head. She denies vomiting or facial pressure. She denies shortness of breath, chest pain or wheezing. She states her mouth feels dry however she's having normal urination due to drinking lots of water. She states she's had seizures in the past when she tried to quit drinking. However she has been sober for around 20 years. She reports about 2 years ago was having lots of vomiting and got very dehydrated and had a seizure due to electrolyte imbalance. She states she's feeling the way she did before she had the seizure 2 years ago.  Patient states she has a history hypertension and she took her blood pressure medicine at 4 AM which is about the usual time today.  Past Medical History:  Diagnosis Date  . Acid reflux   . Chest pain 2009   associated with palpitations; negative chest CT in 2009  . History of alcohol abuse 2009  . Hyperlipidemia    lipid profile in 2009:241, 122, 73, 144  . Hypertension   . Melanoma ([place]) 1999   resected from lower extremity  . Tobacco abuse     Patient Active Problem List   Diagnosis Date Noted  . Hyponatremia 09/26/2013  . Seizure ([place]) 09/26/2013  . Hyperlipidemia   .  Hypertension   . Chest pain   . Melanoma ([place])   . Tobacco abuse   . History of alcohol abuse     Past Surgical History:  Procedure Laterality Date  . COLONOSCOPY  never  . MELANOMA EXCISION  1999   lower extremity    OB History    No data available       Home Medications    Prior to Admission medications   Medication Sig Start Date End Date Taking? Authorizing Provider  LOSARTAN POTASSIUM PO Take 100 mg by mouth daily.   Yes Historical Provider, MD  aspirin 81 MG tablet Take 81 mg by mouth daily.    Historical Provider, MD  atenolol (TENORMIN) 25 MG tablet Take 1 tablet (25 mg total) by mouth daily. 09/29/13   Nita Sells, MD  Flaxseed, Linseed, (FLAX SEED OIL PO) Take 1 tablet by mouth daily.     Historical Provider, MD  omega-3 acid ethyl esters (LOVAZA) 1 G capsule Take 1 g by mouth daily.     Historical Provider, MD    Family History Family History  Problem Relation Age of Onset  . COPD Father   . Hypertension      Social History Social History  Substance Use Topics  . Smoking status: Former Smoker    Packs/day: 0.10    Years: 40.00  Types: Cigarettes    Quit date: 09/04/2013  . Smokeless tobacco: Former Systems developer    Quit date: 06/24/2012     Comment: first quit attempt in 06/2012  . Alcohol use No     Comment: excessive alcohol use in the past noted in 2009  employed States she smokes   Allergies   Penicillins   Review of Systems Review of Systems  All other systems reviewed and are negative.    Physical Exam Updated Vital Signs BP 190/90   Pulse 61   Temp 97.9 F (36.6 C) (Oral)   Resp 18   Ht 5\' 6"  (1.676 m)   Wt 205 lb (93 kg)   SpO2 98%   BMI 33.09 kg/m   Vital signs normal Except for hypertension   Physical Exam  Constitutional: She is oriented to person, place, and time. She appears well-developed and well-nourished.  Non-toxic appearance. She does not appear ill. No distress.  HENT:  Head: Normocephalic and  atraumatic.  Right Ear: External ear normal.  Left Ear: External ear normal.  Nose: Nose normal. No mucosal edema or rhinorrhea.  Mouth/Throat: Oropharynx is clear and moist and mucous membranes are normal. No dental abscesses or uvula swelling.  Eyes: Conjunctivae and EOM are normal. Pupils are equal, round, and reactive to light.  Neck: Normal range of motion and full passive range of motion without pain. Neck supple.  Cardiovascular: Normal rate, regular rhythm and normal heart sounds.  Exam reveals no gallop and no friction rub.   No murmur heard. Pulmonary/Chest: Effort normal and breath sounds normal. No respiratory distress. She has no wheezes. She has no rhonchi. She has no rales. She exhibits no tenderness and no crepitus.  Abdominal: Soft. Normal appearance and bowel sounds are normal. She exhibits no distension. There is no tenderness. There is no rebound and no guarding.  Musculoskeletal: Normal range of motion. She exhibits no edema or tenderness.  Moves all extremities well.   Neurological: She is alert and oriented to person, place, and time. She has normal strength. No cranial nerve deficit.  Skin: Skin is warm, dry and intact. No rash noted. No erythema. No pallor.  Psychiatric: She has a normal mood and affect. Her speech is normal and behavior is normal. Her mood appears not anxious.  Nursing note and vitals reviewed.    ED Treatments / Results  Labs (all labs ordered are listed, but only abnormal results are displayed) Results for orders placed or performed during the hospital encounter of 06/03/16  Comprehensive metabolic panel  Result Value Ref Range   Sodium 124 (L) 135 - 145 mmol/L   Potassium 3.4 (L) 3.5 - 5.1 mmol/L   Chloride 91 (L) 101 - 111 mmol/L   CO2 22 22 - 32 mmol/L   Glucose, Bld 121 (H) 65 - 99 mg/dL   BUN 8 6 - 20 mg/dL   Creatinine, Ser 0.52 0.44 - 1.00 mg/dL   Calcium 8.9 8.9 - 10.3 mg/dL   Total Protein 7.2 6.5 - 8.1 g/dL   Albumin 4.1 3.5 -  5.0 g/dL   AST 18 15 - 41 U/L   ALT 15 14 - 54 U/L   Alkaline Phosphatase 99 38 - 126 U/L   Total Bilirubin 0.8 0.3 - 1.2 mg/dL   GFR calc non Af Amer >60 >60 mL/min   GFR calc Af Amer >60 >60 mL/min   Anion gap 11 5 - 15  CBC with Differential  Result Value Ref Range  WBC 15.4 (H) 4.0 - 10.5 K/uL   RBC 5.17 (H) 3.87 - 5.11 MIL/uL   Hemoglobin 14.3 12.0 - 15.0 g/dL   HCT 41.3 36.0 - 46.0 %   MCV 79.9 78.0 - 100.0 fL   MCH 27.7 26.0 - 34.0 pg   MCHC 34.6 30.0 - 36.0 g/dL   RDW 13.8 11.5 - 15.5 %   Platelets 296 150 - 400 K/uL   Neutrophils Relative % 72 %   Neutro Abs 11.1 (H) 1.7 - 7.7 K/uL   Lymphocytes Relative 19 %   Lymphs Abs 2.9 0.7 - 4.0 K/uL   Monocytes Relative 8 %   Monocytes Absolute 1.2 (H) 0.1 - 1.0 K/uL   Eosinophils Relative 1 %   Eosinophils Absolute 0.1 0.0 - 0.7 K/uL   Basophils Relative 0 %   Basophils Absolute 0.1 0.0 - 0.1 K/uL   Laboratory interpretation all normal except Hyponatremia, leukocytosis, mild hypokalemia, low chloride    EKG  EKG Interpretation None       Radiology No results found.  Procedures Procedures (including critical care time)  Medications Ordered in ED Medications  potassium chloride SA (K-DUR,KLOR-CON) CR tablet 40 mEq (not administered)  sodium chloride 0.9 % bolus 1,000 mL (0 mLs Intravenous Stopped 06/03/16 0736)  sodium chloride 0.9 % bolus 500 mL (500 mLs Intravenous New Bag/Given 06/03/16 0736)  cloNIDine (CATAPRES) tablet 0.1 mg (0.1 mg Oral Given 06/03/16 [id number])  sodium chloride 0.9 % bolus 1,000 mL (1,000 mLs Intravenous New Bag/Given 06/03/16 0735)  ondansetron (ZOFRAN) injection 4 mg (4 mg Intravenous Given 06/03/16 0736)     Initial Impression / Assessment and Plan / ED Course  I have reviewed the triage vital signs and the nursing notes.  Pertinent labs & imaging results that were available during my care of the patient were reviewed by me and considered in my medical decision making (see chart for  details).  Clinical Course    Pt was given IV fluids b/o history or dehydration complicated by seizure. She was given oral catapres for her hypertension  Recheck at 06:55 AM patient states she has just gone to the bathroom and had a small amount of urinary output. She states her legs feel less tight however she still is complaining of nausea. She was given a second liter of normal saline. She was given oral potassium for hypokalemia. We discussed her test results. Patient keeps talking about she needs to drink more water. We had a discussion that she needs to drink more than just plain water or she scored continue to drop her sodium. She needs to drink sports drinks which also has electrolytes in it.  07:38 AM pt discussed with Dr Marin Comment, hospitalist. Will come see patient about admission for more IV fluids to correct her hyponatremia.   Review of her prior records from 2015 shows she presented with a seizure and sodium of 115. This is felt to be due to dehydration from vomiting.  7:55 AM patient was evaluated by Dr. Marin Comment. Patient however wants to try outpatient management. She has agreed to cut out her free water and to drink more electrolytes. She's going to have her doctor recheck her electrolytes early this coming week. Husband is present and agrees with disposition.  Final Clinical Impressions(s) / ED Diagnoses   Final diagnoses:  Hyponatremia  Loss of appetite  Dehydration   Plan discharge  Rolland Porter, MD, Barbette Or, MD, Barbette Or, MD 06/03/16  0801  

## 2016-07-15 ENCOUNTER — Encounter (HOSPITAL_COMMUNITY): Payer: Self-pay | Admitting: Family Medicine

## 2016-07-15 ENCOUNTER — Ambulatory Visit (HOSPITAL_COMMUNITY)
Admission: EM | Admit: 2016-07-15 | Discharge: 2016-07-15 | Disposition: A | Discharge status: Home / Self Care | Payer: Managed Care, Other (non HMO) | Attending: Emergency Medicine | Admitting: Emergency Medicine

## 2016-07-15 DIAGNOSIS — J0100 Acute maxillary sinusitis, unspecified: Secondary | ICD-10-CM | POA: Diagnosis not present

## 2016-07-15 MED ORDER — IBUPROFEN 600 MG PO TABS: 600.0000 mg | ORAL_TABLET | Freq: Four times a day (QID) | ORAL | 0 refills | Status: DC | PRN

## 2016-07-15 MED ORDER — DOXYCYCLINE HYCLATE 100 MG PO CAPS: 100.0000 mg | ORAL_CAPSULE | Freq: Two times a day (BID) | ORAL | 0 refills | Status: AC

## 2016-07-15 MED ORDER — FLUTICASONE PROPIONATE 50 MCG/ACT NA SUSP
2.0000 | Freq: Every day | NASAL | 0 refills | Status: DC
Start: 2016-07-15 — End: 2016-08-12

## 2016-07-15 MED ORDER — HYDROCOD POLST-CPM POLST ER 10-8 MG/5ML PO SUER: 5.0000 mL | Freq: Two times a day (BID) | ORAL | 0 refills | Status: DC | PRN

## 2016-07-15 NOTE — ED Provider Notes (Signed)
HPI  SUBJECTIVE:  Sarah Palmer is a 61 y.o. female who presents with body aches, and greenish nasal congestion, sinus pain and pressure, postnasal drip, cough productive of the same material as what is coming out of her nose, upper dental pain, decreased appetite for 2 weeks. She reports some watery, nonbloody loose stools described as diarrhea approximately 2 to 3 times a day. She denies fevers, chest pain, shortness of breath. She tried Mucinex DM which made her symptoms worse, also bending forward and lying down makes her sinus pain and pressure worse. she also tried Alka-Seltzer cold plus. There are no alleviating factors. She has a past medical history including hypertension, sinusitis. She did take her medicines about 15 minutes prior to evaluation. She states her blood pressure "is always elevated" when seen in the doctor. She states it ranges in the 140s over the high 80s at home. She states her blood pressure was 175/93 at her doctor's on Tuesday. She is a former smoker. PMD: Tawanna Solo, MD     Past Medical History:  Diagnosis Date  . Acid reflux   . Chest pain 2009   associated with palpitations; negative chest CT in 2009  . History of alcohol abuse 2009  . Hyperlipidemia    lipid profile in 2009:241, 122, 73, 144  . Hypertension   . Melanoma ([place]) 1999   resected from lower extremity  . Tobacco abuse     Past Surgical History:  Procedure Laterality Date  . COLONOSCOPY  never  . MELANOMA EXCISION  1999   lower extremity    Family History  Problem Relation Age of Onset  . COPD Father   . Hypertension      Social History  Substance Use Topics  . Smoking status: Former Smoker    Packs/day: 0.10    Years: 40.00    Types: Cigarettes    Quit date: 09/04/2013  . Smokeless tobacco: Former Systems developer    Quit date: 06/24/2012     Comment: first quit attempt in 06/2012  . Alcohol use No     Comment: excessive alcohol use in the past noted in 2009    No current  facility-administered medications for this encounter.   Current Outpatient Prescriptions:  .  aspirin 81 MG tablet, Take 81 mg by mouth daily., Disp: , Rfl:  .  atenolol (TENORMIN) 25 MG tablet, Take 1 tablet (25 mg total) by mouth daily., Disp: 60 tablet, Rfl: 0 .  chlorpheniramine-HYDROcodone (TUSSIONEX PENNKINETIC ER) 10-8 MG/5ML SUER, Take 5 mLs by mouth every 12 (twelve) hours as needed for cough., Disp: 120 mL, Rfl: 0 .  doxycycline (VIBRAMYCIN) 100 MG capsule, Take 1 capsule (100 mg total) by mouth 2 (two) times daily. X 7 days, Disp: 14 capsule, Rfl: 0 .  Flaxseed, Linseed, (FLAX SEED OIL PO), Take 1 tablet by mouth daily. , Disp: , Rfl:  .  fluticasone (FLONASE) 50 MCG/ACT nasal spray, Place 2 sprays into both nostrils daily., Disp: 16 g, Rfl: 0 .  ibuprofen (ADVIL,MOTRIN) 600 MG tablet, Take 1 tablet (600 mg total) by mouth every 6 (six) hours as needed., Disp: 30 tablet, Rfl: 0 .  LOSARTAN POTASSIUM PO, Take 100 mg by mouth daily., Disp: , Rfl:  .  omega-3 acid ethyl esters (LOVAZA) 1 G capsule, Take 1 g by mouth daily. , Disp: , Rfl:   Allergies  Allergen Reactions  . Penicillins      ROS  As noted in HPI.   Physical Exam  BP  167/97 (BP Location: Left Arm)   Pulse 72   Temp 98.4 F (36.9 C)   Resp 18   SpO2 98%   BP Readings from Last 3 Encounters:  07/15/16 167/97  06/03/16 176/81  09/29/13 (!) 171/108    Constitutional: Well developed, well nourished, no acute distress Eyes:  EOMI, conjunctiva normal bilaterally HENT: Normocephalic, atraumatic,mucus membranes moist TM's normal bilaterally. mild nasal congestion. Normal turbinates. Positive maxillary sinus tenderness. No frontal sinus tenderness. Normal oropharynx. Positive postnasal drip.  Respiratory: Normal inspiratory effort, lungs clear bilaterally  Cardiovascular: Normal rate regular rhythm no murmurs rubs or gallops  GI: nondistended skin: No rash, skin intact Musculoskeletal: no  deformities Neurologic: Alert & oriented x 3, no focal neuro deficits Psychiatric: Speech and behavior appropriate   ED Course   Medications - No data to display  Orders Placed This Encounter  Procedures  . Re-check Vital Signs    Standing Status:   Standing    Number of Occurrences:   1    No results found for this or any previous visit (from the past 24 hour(s)). No results found.  ED Clinical Impression  Acute maxillary sinusitis, recurrence not specified   ED Assessment/Plan  Presentation most consistent with a sinusitis. Plan to send home with doxycycline, saline nasal irrigation, Flonase, ibuprofen 600 mg with 1 g of Tylenol 4 times a day. She is to start an antihistamine of choice such as Claritin, Allegra, Zyrtec. Also home with Tussionex. No bronchodilators as patient's lungs are clear. Follow-up with PMD as needed.   She also is to keep an eye on her blood pressure at home. Discussed signs and symptoms of hypertensive emergency that should take her to the emergency department. Patient agrees with plan   Meds ordered this encounter  Medications  . doxycycline (VIBRAMYCIN) 100 MG capsule    Sig: Take 1 capsule (100 mg total) by mouth 2 (two) times daily. X 7 days    Dispense:  14 capsule    Refill:  0  . fluticasone (FLONASE) 50 MCG/ACT nasal spray    Sig: Place 2 sprays into both nostrils daily.    Dispense:  16 g    Refill:  0  . chlorpheniramine-HYDROcodone (TUSSIONEX PENNKINETIC ER) 10-8 MG/5ML SUER    Sig: Take 5 mLs by mouth every 12 (twelve) hours as needed for cough.    Dispense:  120 mL    Refill:  0  . ibuprofen (ADVIL,MOTRIN) 600 MG tablet    Sig: Take 1 tablet (600 mg total) by mouth every 6 (six) hours as needed.    Dispense:  30 tablet    Refill:  0    *This clinic note was created using [employer]. Therefore, there may be occasional mistakes despite careful proofreading.  ?   Melynda Ripple, MD 07/15/16 1053

## 2016-07-15 NOTE — ED Triage Notes (Signed)
Pt here for URI symptoms, diarrhea and body aches.

## 2016-07-15 NOTE — Discharge Instructions (Signed)
Take the medication as written. You may take 600 mg of motrin with 1 gram of tylenol up to 4 times a day as needed for pain. This is an effective combination for pain.. Use a neti pot or the NeilMed sinus rinse as often as you want to to reduce nasal congestion. Follow the directions on the box.   Go to www.goodrx.com to look up your medications. This will give you a list of where you can find your prescriptions at the most affordable prices.

## 2016-07-27 ENCOUNTER — Encounter: Payer: Self-pay | Admitting: Oncology

## 2016-07-27 ENCOUNTER — Telehealth: Payer: Self-pay | Admitting: Oncology

## 2016-07-27 NOTE — Telephone Encounter (Signed)
Appt has been scheduled for the pt to see Dr. Talbert Cage on 1/26 at 3pm.Aware to arrive 30 minutes early. Demographics verified. Letter mailed.

## 2016-08-12 ENCOUNTER — Encounter: Payer: Self-pay | Admitting: Family Medicine

## 2016-08-12 ENCOUNTER — Ambulatory Visit (INDEPENDENT_AMBULATORY_CARE_PROVIDER_SITE_OTHER): Payer: Managed Care, Other (non HMO) | Admitting: Family Medicine

## 2016-08-12 VITALS — BP 134/80 | HR 78 | Resp 12 | Ht 66.0 in | Wt 226.4 lb

## 2016-08-12 DIAGNOSIS — I10 Essential (primary) hypertension: Secondary | ICD-10-CM | POA: Diagnosis not present

## 2016-08-12 DIAGNOSIS — J449 Chronic obstructive pulmonary disease, unspecified: Secondary | ICD-10-CM

## 2016-08-12 DIAGNOSIS — G4700 Insomnia, unspecified: Secondary | ICD-10-CM | POA: Diagnosis not present

## 2016-08-12 DIAGNOSIS — Q678 Other congenital deformities of chest: Secondary | ICD-10-CM | POA: Diagnosis not present

## 2016-08-12 DIAGNOSIS — F419 Anxiety disorder, unspecified: Secondary | ICD-10-CM

## 2016-08-12 DIAGNOSIS — K219 Gastro-esophageal reflux disease without esophagitis: Secondary | ICD-10-CM

## 2016-08-12 NOTE — Patient Instructions (Addendum)
A few things to remember from today's visit:   Chest wall asymmetry - Plan: CT Chest W Contrast  Essential hypertension  No changes today. ? Fat accumulation. Keep appt with endocrinologists and hematologists.    Please be sure medication list is accurate. If a new problem present, please set up appointment sooner than planned today.

## 2016-08-12 NOTE — Progress Notes (Signed)
HPI:   Ms.Alahna Durrette is a 61 y.o. female, who is here today to establish care with me.  Former PCP: Almyra Free Last preventive routine visit: Over a year ago, ? 2016.  Chronic medical problems: HTN,insomnia,and GERD among some.   Concerns today: "Lump" on upper chest and problem in throat.  -1-2 weeks ago she noted "swelling" in throat, bilateral. She denies any odynophagia or dysphagia. She has tried to reach them with fingers. She denies stridor,or any discomfort. S/P tonsillectomy.  Former smoker, quit a month ago. Intermittent cough and wheezing, she has used Albuterol inh , needs refills.  -2 months ago noted "lump" on upper mid chest that extends to left chest wall. She has not noted skin lesions, it is not tender. She has not noted changes in size.It is more noticeable when she is sitting or standing, less visible when lying down. She denies any Hx of trauma.  She has an appt with endocrinologists and hematologies, referred by former PCP. Elevated WBC's,15.4. Initially done in the ER when she was going through  An acute URI. She states that repeated by PCP, improved but still nor in normal range. CBC was done again and WBC's back to 15,000's, so referred to hematologists.  Lab Results  Component Value Date   WBC 15.4 (H) 06/03/2016   HGB 14.3 06/03/2016   HCT 41.3 06/03/2016   MCV 79.9 06/03/2016   PLT 296 06/03/2016   + Fatigue, for a while.  According to pt, her BP has been fluctuating, last time she followed Atenolol was increased from 100 mg to 200 mg. She states that blood work was done and norepinephrine was elevated, so she was referred to endocrinologists. Occasionally she feels some tremor "inside", anxious. She looked up on line and found out these symptoms are associated with adrenal problems. She lost some wt , atrtributed to concerns about her health.   -She has not had colonoscopy, missed appt in 2008, she had GI consultation recently  and needs to re-schedule colonoscopy.  Hx of melanoma, removed in 1997 from LLE, chemo or other treatment were not recommended.  Hypertension:  Currently on Amlodipine 5 mg, Atenolol 200 mg daily, and Losartan 100 mg.    She is taking medications as instructed, no side effects reported.  She has not noted unusual headache, visual changes, exertional chest pain, dyspnea,  focal weakness, or edema.   Lab Results  Component Value Date   CREATININE 0.52 06/03/2016   BUN 8 06/03/2016   NA 124 (L) 06/03/2016   K 3.4 (L) 06/03/2016   CL 91 (L) 06/03/2016   CO2 22 06/03/2016  According to pt, Na++ and K+ were re-checked by former PCP and back to normal.   Hx of insomnia, currently she is on Trazodone. She was having trouble staying asleep. Trazodone has helped, denies side effects.   GERD: She is on Pantoprazole 40 mg daily, which has helped with heartburn.  Denies abdominal pain, nausea, vomiting, changes in bowel habits, blood in stool or melena.   Review of Systems  Constitutional: Positive for fatigue. Negative for activity change, appetite change, fever and unexpected weight change.  HENT: Positive for postnasal drip. Negative for mouth sores, nosebleeds, sore throat, trouble swallowing and voice change.   Eyes: Negative for redness and visual disturbance.  Respiratory: Positive for cough and wheezing (occasional, just in the morning and better after clearing her throat). Negative for shortness of breath.   Cardiovascular: Negative for chest pain,  palpitations and leg swelling.  Gastrointestinal: Negative for abdominal pain, nausea and vomiting.       Negative for changes in bowel habits.  Endocrine: Negative for cold intolerance, heat intolerance, polydipsia, polyphagia and polyuria.  Genitourinary: Negative for decreased urine volume, difficulty urinating, dysuria and hematuria.  Musculoskeletal: Negative for back pain and myalgias.  Skin: Negative for color change and rash.   Neurological: Negative for syncope, weakness, numbness and headaches.  Hematological: Negative for adenopathy. Does not bruise/bleed easily.  Psychiatric/Behavioral: Positive for sleep disturbance. Negative for confusion. The patient is nervous/anxious.       Current Outpatient Prescriptions on File Prior to Visit  Medication Sig Dispense Refill  . aspirin 81 MG tablet Take 81 mg by mouth daily.    . Flaxseed, Linseed, (FLAX SEED OIL PO) Take 1 tablet by mouth daily.     Marland Kitchen ibuprofen (ADVIL,MOTRIN) 600 MG tablet Take 1 tablet (600 mg total) by mouth every 6 (six) hours as needed. 30 tablet 0  . omega-3 acid ethyl esters (LOVAZA) 1 G capsule Take 1 g by mouth daily.      No current facility-administered medications on file prior to visit.      Past Medical History:  Diagnosis Date  . Acid reflux   . Anxiety   . Chest pain 2009   associated with palpitations; negative chest CT in 2009  . History of alcohol abuse 2009  . Hyperlipidemia    lipid profile in 2009:241, 122, 73, 144  . Hypertension   . Melanoma ([place]) 1999   resected from lower extremity  . Tobacco abuse    Allergies  Allergen Reactions  . Penicillins     Family History  Problem Relation Age of Onset  . COPD Father   . Hypertension      Social History   Social History  . Marital status: Single    Spouse name: N/A  . Number of children: N/A  . 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 Smoking status: Former Smoker    Packs/day: 0.10    Years: 40.00    Types: Cigarettes    Quit date: 09/04/2013  . Smokeless tobacco: Former Systems developer    Quit date: 06/24/2012     Comment: first quit attempt in 06/2012  . Alcohol use No     Comment: excessive alcohol use in the past noted in 2009  . Drug use: No  . Sexual activity: Yes    Birth control/ protection: Post-menopausal   Other Topics Concern  . None   Social History Narrative    Patient lives in [place] with her boyfriend of 82 he is    Vitals:   08/12/16 0654  BP: 134/80  Pulse: 78  Resp: 12   O2 sat 98% at RA. Body mass index is 36.54 kg/m.   Physical Exam  Nursing note and vitals reviewed. Constitutional: She is oriented to person, place, and time. She appears well-developed. No distress.  HENT:  Head: Atraumatic.  Mouth/Throat: Uvula is midline, oropharynx is clear and moist and mucous membranes are normal.  She points to tonsillar area, where she has small smooth prominence,L>R. These seem tonsil residual tissue vs scarring changes.   Eyes: Conjunctivae and EOM are normal. Pupils are equal, round, and reactive to light.  Neck: No JVD present. No tracheal deviation present. No thyroid mass and no thyromegaly present.  Cardiovascular: Normal rate and  regular rhythm.   No murmur heard. Pulses:      Dorsalis pedis pulses are 2+ on the right side, and 2+ on the left side.  Respiratory: Effort normal and breath sounds normal. No respiratory distress.  GI: Soft. She exhibits no mass. There is no hepatomegaly. There is no tenderness.  Genitourinary: No breast tenderness.  Genitourinary Comments: Breast: No masses or nipple discharge bilateral.  Musculoskeletal: She exhibits no edema.  Lymphadenopathy:       Head (right side): No submandibular, no preauricular and no posterior auricular adenopathy present.       Head (left side): No submandibular, no preauricular and no posterior auricular adenopathy present.    She has no cervical adenopathy.    She has no axillary adenopathy.       Right: No supraclavicular adenopathy present.       Left: No supraclavicular adenopathy present.  Neurological: She is alert and oriented to person, place, and time. She has normal strength. Coordination and gait normal.  Skin: Skin is warm. No rash noted. No erythema.     I noted prominence on upper chest, distal aspect of sternum and extends to left upper chest wall,  no tender, no defined borders, soft.  Psychiatric: Her mood appears anxious.  Well groomed, good eye contact.      ASSESSMENT AND PLAN:   Richele was seen today for establish care.  Diagnoses and all orders for this visit:   Insomnia, unspecified type  Good sleep hygiene. No changes in current management.  Chest wall asymmetry  Prominence noted on exam seem to be adipose tissue. Chest CT was ordered, given her Hx of tobacco use + pt concern.  -     CT Chest W Contrast; Future  Essential hypertension  Adequately controlled. No changes in current management for now. DASH diet recommended. Eye exam recommended annually. F/U in 2  months, before if needed.  Anxiety disorder, unspecified type  Some of the symptoms she is reporting as related to elevated NE could be relate to anxiety. Continue Trazodone for now. F/U in 2 months.  Gastroesophageal reflux disease, esophagitis presence not specified  Asymptomatic. GERD precaution to continue. No changes in current management. F/U in 6-12 months.  Chronic obstructive pulmonary disease, unspecified COPD type ([place])  Reporting cough and wheezing intermittently for a while. Encouraged to continue tobacco free. Continue Albuterol inh as needed. F/U in 2 months.  -     albuterol (PROVENTIL HFA;VENTOLIN HFA) 108 (90 Base) MCG/ACT inhaler; Inhale 2 puffs into the lungs every 6 (six) hours as needed for wheezing or shortness of breath.   In regard to "swelling" in throat, reassured, lesions she is pointing at are not suspicious, they are otherwise symmetric; seems residual tonsil tissue. I am thinking they have been there for a while but just noted because she has been concerned about recent health problems.Continue monitoring for changes. -Keep appt with endocrinologists and hematologists. -Since she is having appt in 4 days with hematologists, most likely labs will be done, so no labs today.   Sheddrick Lattanzio G. [place],  MD  [facility]. [place] office.

## 2016-08-12 NOTE — Progress Notes (Signed)
Pre visit review using our clinic review tool, if applicable. No additional management support is needed unless otherwise documented below in the visit note. 

## 2016-08-13 ENCOUNTER — Encounter: Payer: Self-pay | Admitting: Family Medicine

## 2016-08-13 DIAGNOSIS — K219 Gastro-esophageal reflux disease without esophagitis: Secondary | ICD-10-CM | POA: Insufficient documentation

## 2016-08-13 MED ORDER — ALBUTEROL SULFATE HFA 108 (90 BASE) MCG/ACT IN AERS: 2.0000 | INHALATION_SPRAY | Freq: Four times a day (QID) | RESPIRATORY_TRACT | 1 refills | Status: DC | PRN

## 2016-08-15 ENCOUNTER — Telehealth: Payer: Self-pay | Admitting: Family Medicine

## 2016-08-15 DIAGNOSIS — I10 Essential (primary) hypertension: Secondary | ICD-10-CM

## 2016-08-15 MED ORDER — AMLODIPINE BESYLATE 5 MG PO TABS: 5.0000 mg | ORAL_TABLET | Freq: Every day | ORAL | 1 refills | Status: DC

## 2016-08-15 NOTE — Telephone Encounter (Signed)
Pt needs refill amLODipine (NORVASC) 5 MG tablet  [facility]   Pt also states she is not on albuterol (PROVENTIL HFA;VENTOLIN HFA) 108 (90 Base) MCG/ACT inhaler Would like you to remove from med list.  Please change pt preferred pharmacy to [facility] [address]

## 2016-08-15 NOTE — Telephone Encounter (Signed)
Rx sent, pharmacy changed, and inhaler taken off med list.

## 2016-08-16 ENCOUNTER — Ambulatory Visit (HOSPITAL_BASED_OUTPATIENT_CLINIC_OR_DEPARTMENT_OTHER): Payer: Managed Care, Other (non HMO)

## 2016-08-16 ENCOUNTER — Encounter: Payer: Self-pay | Admitting: Family Medicine

## 2016-08-16 ENCOUNTER — Telehealth: Payer: Self-pay | Admitting: Hematology

## 2016-08-16 ENCOUNTER — Ambulatory Visit (HOSPITAL_BASED_OUTPATIENT_CLINIC_OR_DEPARTMENT_OTHER): Payer: Managed Care, Other (non HMO) | Admitting: Hematology

## 2016-08-16 ENCOUNTER — Encounter: Payer: Self-pay | Admitting: Hematology

## 2016-08-16 VITALS — BP 183/96 | HR 59 | Temp 98.0°F | Resp 18 | Ht 66.0 in | Wt 229.6 lb

## 2016-08-16 DIAGNOSIS — D72829 Elevated white blood cell count, unspecified: Secondary | ICD-10-CM

## 2016-08-16 DIAGNOSIS — D72821 Monocytosis (symptomatic): Secondary | ICD-10-CM | POA: Diagnosis not present

## 2016-08-16 DIAGNOSIS — D72828 Other elevated white blood cell count: Secondary | ICD-10-CM | POA: Diagnosis not present

## 2016-08-16 DIAGNOSIS — I10 Essential (primary) hypertension: Secondary | ICD-10-CM

## 2016-08-16 LAB — CBC & DIFF AND RETIC
BASO%: 0.5 % (ref 0.0–2.0)
BASOS ABS: 0.1 10*3/uL (ref 0.0–0.1)
EOS%: 2.2 % (ref 0.0–7.0)
Eosinophils Absolute: 0.3 10*3/uL (ref 0.0–0.5)
HEMATOCRIT: 43.4 % (ref 34.8–46.6)
HGB: 14.1 g/dL (ref 11.6–15.9)
Immature Retic Fract: 4.4 % (ref 1.60–10.00)
LYMPH%: 31.8 % (ref 14.0–49.7)
MCH: 27.1 pg (ref 25.1–34.0)
MCHC: 32.5 g/dL (ref 31.5–36.0)
MCV: 83.3 fL (ref 79.5–101.0)
MONO#: 0.8 10*3/uL (ref 0.1–0.9)
MONO%: 7 % (ref 0.0–14.0)
NEUT#: 7.1 10*3/uL — ABNORMAL HIGH (ref 1.5–6.5)
NEUT%: 58.5 % (ref 38.4–76.8)
PLATELETS: 274 10*3/uL (ref 145–400)
RBC: 5.21 10*6/uL (ref 3.70–5.45)
RDW: 14.3 % (ref 11.2–14.5)
RETIC %: 1.26 % (ref 0.70–2.10)
Retic Ct Abs: 65.65 10*3/uL (ref 33.70–90.70)
WBC: 12.1 10*3/uL — ABNORMAL HIGH (ref 3.9–10.3)
lymph#: 3.8 10*3/uL — ABNORMAL HIGH (ref 0.9–3.3)

## 2016-08-16 LAB — COMPREHENSIVE METABOLIC PANEL
ALBUMIN: 4 g/dL (ref 3.5–5.0)
ALT: 16 U/L (ref 0–55)
ANION GAP: 10 meq/L (ref 3–11)
AST: 15 U/L (ref 5–34)
Alkaline Phosphatase: 119 U/L (ref 40–150)
BUN: 11.9 mg/dL (ref 7.0–26.0)
CALCIUM: 9.5 mg/dL (ref 8.4–10.4)
CHLORIDE: 103 meq/L (ref 98–109)
CO2: 26 mEq/L (ref 22–29)
Creatinine: 0.7 mg/dL (ref 0.6–1.1)
EGFR: 88 mL/min/{1.73_m2} — AB (ref >90)
Glucose: 91 mg/dl (ref 70–140)
POTASSIUM: 3.7 meq/L (ref 3.5–5.1)
Sodium: 139 mEq/L (ref 136–145)
Total Bilirubin: 0.46 mg/dL (ref 0.20–1.20)
Total Protein: 7.2 g/dL (ref 6.4–8.3)

## 2016-08-16 LAB — TSH: TSH: 1.256 m(IU)/L (ref 0.308–3.960)

## 2016-08-16 LAB — CHCC SMEAR

## 2016-08-16 LAB — LACTATE DEHYDROGENASE: LDH: 151 U/L (ref 125–245)

## 2016-08-16 NOTE — Telephone Encounter (Signed)
Appointments scheduled per 2/6 LOS. Patient given AVS report and calendars with future scheduled appointments. °

## 2016-08-16 NOTE — Progress Notes (Signed)
Marland Kitchen    HEMATOLOGY/ONCOLOGY CONSULTATION NOTE  Date of Service: 08/16/2016  Patient Care Team: Betty G Martinique, MD as PCP - General (Family Medicine) Yehuda Savannah, MD as Attending Physician (Cardiology)  CHIEF COMPLAINTS/PURPOSE OF CONSULTATION:  leucocytosis  HISTORY OF PRESENTING ILLNESS:   Sarah Palmer is a wonderful 61 y.o. female who has been referred to [place] by Dr .Martinique, Betty G, MD for evaluation and management of leucocytosis.  Patient has a history of hypertension, dyslipidemia, previous alcohol abuse sober for 5 years, cigarette smoker, remote history of melanoma resected from her lower extremity.  Patient was recently seen by her primary care physician and had labs on 07/12/2016 that showed leukocytosis of 14.2k with neutrophilia of 8.4k mild monocytosis of 1k and lymphocytes of 4.6k. Hemoglobin was within normal limits at 14.5 with an MCV of 82. Platelets were normal at 316k.  Review of labs in our system suggest the patient has had elevated WBC count since at least 2015. Her WBC count was 15k on 09/26/2013, 15.4k on 06/03/2016, 11.3k on 06/16/2016 and 14.2k on 07/12/2016.  Patient notes that she has had issues with her intermittent cough some cold and increased phlegm production and baseline. She continues to smoke at least half a pack a day of cigarettes. She notes that she has remained sober on the alcohol for at least 5 years. Previous history of alcohol abuse.  No new focal symptoms suggestive of malignancy. No current fevers chills night sweats. Has not noticed any new lumps or bumps. No abdominal pain or discomfort. No change in bowel habits. No blood in the urine. No blood in the stools.   MEDICAL HISTORY:  Past Medical History:  Diagnosis Date  . Acid reflux   . Anxiety   . Chest pain 2009   associated with palpitations; negative chest CT in 2009  . History of alcohol abuse 2009  . Hyperlipidemia    lipid profile in 2009:241, 122, 73, 144  . Hypertension   .  Melanoma ([place]) 1999   resected from lower extremity  . Tobacco abuse     SURGICAL HISTORY: Past Surgical History:  Procedure Laterality Date  . COLONOSCOPY  never  . MELANOMA EXCISION  1999   lower extremity    SOCIAL HISTORY: Social History   Social History  . Marital status: Single    Spouse name: N/A  . Number of children: N/A  . 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 Smoking status: Former Smoker    Packs/day: 0.10    Years: 40.00    Types: Cigarettes    Quit date: 09/04/2013  . Smokeless tobacco: Former Systems developer    Quit date: 06/24/2012     Comment: first quit attempt in 06/2012  . Alcohol use No     Comment: excessive alcohol use in the past noted in 2009  . Drug use: No  . Sexual activity: Yes    Birth control/ protection: Post-menopausal   Other Topics Concern  . Not on file   Social History Narrative   Patient lives in [place] with her boyfriend of 57 he is    FAMILY HISTORY: Family History  Problem Relation Age of Onset  . COPD Father   . Hypertension      ALLERGIES:  is allergic to penicillins.  MEDICATIONS:  Current Outpatient Prescriptions  Medication Sig Dispense Refill  . amLODipine (NORVASC) 5 MG tablet Take 1  tablet (5 mg total) by mouth daily. 90 tablet 1  . aspirin 81 MG tablet Take 81 mg by mouth daily.    Marland Kitchen atenolol (TENORMIN) 100 MG tablet Take 200 mg by mouth daily.    . Flaxseed, Linseed, (FLAX SEED OIL PO) Take 1 tablet by mouth daily.     Marland Kitchen ibuprofen (ADVIL,MOTRIN) 600 MG tablet Take 1 tablet (600 mg total) by mouth every 6 (six) hours as needed. 30 tablet 0  . losartan (COZAAR) 100 MG tablet Take 100 mg by mouth daily.    Marland Kitchen omega-3 acid ethyl esters (LOVAZA) 1 G capsule Take 1 g by mouth daily.     . pantoprazole (PROTONIX) 40 MG tablet Take 40 mg by mouth daily.    . traZODone (DESYREL) 50 MG tablet Take 50 mg by mouth at bedtime.     No current  facility-administered medications for this visit.     REVIEW OF SYSTEMS:    10 Point review of Systems was done is negative except as noted above.  PHYSICAL EXAMINATION: ECOG PERFORMANCE STATUS: 1 - Symptomatic but completely ambulatory  . Vitals:   08/16/16 1120  BP: (!) 183/96  Pulse: (!) 59  Resp: 18  Temp: 98 F (36.7 C)   Filed Weights   08/16/16 1120  Weight: 229 lb 9.6 oz (104.1 kg)   .Body mass index is 37.06 kg/m.  GENERAL:alert, in no acute distress and comfortable SKIN: skin color, texture, turgor are normal, no rashes or significant lesions EYES: normal, conjunctiva are pink and non-injected, sclera clear OROPHARYNX:no exudate, no erythema and lips, buccal mucosa, and tongue normal  NECK: supple, no JVD, thyroid normal size, non-tender, without nodularity LYMPH:  no palpable lymphadenopathy in the cervical, axillary or inguinal LUNGS: clear to auscultation with normal respiratory effort HEART: regular rate & rhythm,  no murmurs and no lower extremity edema ABDOMEN: abdomen soft, non-tender, normoactive bowel sounds  Musculoskeletal: no cyanosis of digits and no clubbing  PSYCH: alert & oriented x 3 with fluent speech NEURO: no focal motor/sensory deficits  LABORATORY DATA:  I have reviewed the data as listed  . CBC Latest Ref Rng & Units 08/16/2016 06/03/2016 09/26/2013  WBC 3.9 - 10.3 10e3/uL 12.1(H) 15.4(H) DUPLICATE  Hemoglobin 123XX123 - 15.9 g/dL [id number] 123456 DUPLICATE  Hematocrit [id number] - 0000000 % A999333 Q000111Q DUPLICATE  Platelets Q000111Q - 400 [id number] 0000000 DUPLICATE   . CBC    Component Value Date/Time   WBC 12.1 (H) 08/16/2016 1238   WBC 15.4 (H) 06/03/2016 0609   RBC 5.21 08/16/2016 1238   RBC 5.17 (H) 06/03/2016 0609   HGB 14.1 08/16/2016 1238   HCT 43.4 08/16/2016 1238   PLT 274 08/16/2016 1238   MCV 83.3 08/16/2016 1238   MCH 27.1 08/16/2016 1238   MCH 27.7 06/03/2016 0609   MCHC 32.5 08/16/2016 1238   MCHC 34.6 06/03/2016 0609   RDW 14.3  08/16/2016 1238   LYMPHSABS 3.8 (H) 08/16/2016 1238   MONOABS 0.8 08/16/2016 1238   EOSABS 0.3 08/16/2016 1238   BASOSABS 0.1 08/16/2016 1238     . CMP Latest Ref Rng & Units 08/16/2016 06/03/2016 09/29/2013  Glucose 70 - 140 mg/dl 91 121(H) 99  BUN 7.0 - 26.0 mg/dL 11.9 8 8   Creatinine 0.6 - 1.1 mg/dL 0.7 0.52 0.58  Sodium 136 - 145 mEq/L 139 124(L) 138  Potassium 3.5 - 5.1 mEq/L 3.7 3.4(L) 3.5(L)  Chloride 101 - 111 mmol/L - 91(L) 99  CO2 22 - 29  mEq/L 26 22 28   Calcium 8.4 - 10.4 mg/dL 9.5 8.9 8.8  Total Protein 6.4 - 8.3 g/dL 7.2 7.2 -  Total Bilirubin 0.20 - 1.20 mg/dL 0.46 0.8 -  Alkaline Phos 40 - 150 U/L 119 99 -  AST 5 - 34 U/L 15 18 -  ALT 0 - 55 U/L 16 15 -   . Lab Results  Component Value Date   LDH 151 08/16/2016   Component     Latest Ref Rng & Units 08/16/2016  T3 Uptake Ratio     24 - 39 % 27  Free Thyroxine Index     1.2 - 4.9 2.1  Sed Rate     0 - 40 mm/hr 7  LDH     125 - 245 U/L 151  Thyroxine (T4)     4.5 - 12.0 ug/dL 7.9  T4,Free(Direct)     0.82 - 1.77 ng/dL 1.43  TSH     0.308 - 3.960 m(IU)/L 1.256    RADIOGRAPHIC STUDIES: I have personally reviewed the radiological images as listed and agreed with the findings in the report. No results found.  ASSESSMENT & PLAN:   61 year old female with  #1 non-progressive chronic leukocytosis for at least 2015 likely longer. WBC counts evaluated in the 11.4-15.4k range  This is primarily characterized by neutrophilia some mild lymphocytosis and borderline monocytosis. Peripheral blood smear shows no increased blasts, no significant myeloid left shift.  No associated thrombocytosis or polycythemia or cytopenias. Overall picture appears to be consistent with reactive process -likely from her chronic smoking. Could also have some element of leukocytosis from obesity. .Body mass index is 37.06 kg/m.  No clinical evidence of lymphoproliferative disorder. LDH within normal limits. Sedimentation rate  within normal limits. She denies any recent use of steroids.  Plan -Patient was extensively counseled on absolute smoking cessation. -Low likelihood of clonal lymphoproliferative or myeloproliferative process at this time given the nonprogressive nature of the patient's WBC counts which are in fact improved at this time. -Would not recommend bone marrow examination or clonal studies for MPN at this time but might consider these of her WBC count showed signs of continuing to increase.  #2 history of melanoma in 1999 status post wide local excision lower extremity -Recommended sun protection -Needs to continue follow-up with dermatology for skin screening every 6 months. Would recommend primary care physician given a dermatology referral.  Labs today RTC in 4 months with rpt labs  All of the patients questions were answered with apparent satisfaction. The patient knows to call the clinic with any problems, questions or concerns.  I spent 40 minutes counseling the patient face to face. The total time spent in the appointment was 50 minutes and more than 50% was on counseling and direct patient cares.    Sullivan Lone MD [place] AAHIVMS [facility] Hematology/Oncology Physician [facility]  (Office):       [phone] (Work cell):  [phone] (Fax):           [phone]  08/16/2016 11:42 AM

## 2016-08-17 LAB — T3 UPTAKE
FREE THYROXINE INDEX: 2.1 (ref 1.2–4.9)
T3 UPTAKE RATIO: 27 % (ref 24–39)

## 2016-08-17 LAB — SEDIMENTATION RATE: SED RATE: 7 mm/h (ref 0–40)

## 2016-08-17 LAB — T4: Thyroxine (T4): 7.9 ug/dL (ref 4.5–12.0)

## 2016-08-17 LAB — T4, FREE: T4,Free(Direct): 1.43 ng/dL (ref 0.82–1.77)

## 2016-08-18 ENCOUNTER — Ambulatory Visit
Admission: RE | Admit: 2016-08-18 | Discharge: 2016-08-18 | Disposition: A | Discharge status: Home / Self Care | Payer: Managed Care, Other (non HMO) | Source: Ambulatory Visit | Attending: Family Medicine | Admitting: Family Medicine

## 2016-08-18 DIAGNOSIS — Q678 Other congenital deformities of chest: Secondary | ICD-10-CM

## 2016-08-18 MED ORDER — IOPAMIDOL (ISOVUE-300) INJECTION 61%
75.0000 mL | Freq: Once | INTRAVENOUS | Status: AC | PRN
  Administered 2016-08-18: 75 mL via INTRAVENOUS

## 2016-08-19 ENCOUNTER — Telehealth: Payer: Self-pay | Admitting: Family Medicine

## 2016-08-19 ENCOUNTER — Other Ambulatory Visit: Payer: Self-pay | Admitting: Family Medicine

## 2016-08-19 DIAGNOSIS — G4700 Insomnia, unspecified: Secondary | ICD-10-CM

## 2016-08-19 DIAGNOSIS — I10 Essential (primary) hypertension: Secondary | ICD-10-CM

## 2016-08-19 MED ORDER — AMLODIPINE BESYLATE 5 MG PO TABS: 5.0000 mg | ORAL_TABLET | Freq: Every day | ORAL | 1 refills | Status: DC

## 2016-08-19 MED ORDER — TRAZODONE HCL 50 MG PO TABS: 50.0000 mg | ORAL_TABLET | Freq: Every day | ORAL | 0 refills | Status: DC

## 2016-08-19 NOTE — Telephone Encounter (Signed)
Pt would like to have her CT results from 08/18/16 and also need to have a new Rx for trazodone   Pharm:  [facility]

## 2016-08-19 NOTE — Telephone Encounter (Signed)
Rx sent.   CT results pending.

## 2016-08-19 NOTE — Telephone Encounter (Signed)
Informed patient of results and patient verbalized understanding. Patient aware Rx was sent in.

## 2016-08-19 NOTE — Telephone Encounter (Signed)
Rx for Trazodone has been sent ( I sent it again).  Chest CT done yesterday did not show any suspicious lesion on chest wall or lungs. Mild degenerative changes on sternoclavicular joints and cervical spine.  I still think that asymmetry on upper chest for which she was concerned is caused by fat tissue accumulation.  No further imaging needed at this time.  Thanks, BJ

## 2016-09-01 ENCOUNTER — Ambulatory Visit (INDEPENDENT_AMBULATORY_CARE_PROVIDER_SITE_OTHER): Payer: Managed Care, Other (non HMO) | Admitting: Endocrinology

## 2016-09-01 ENCOUNTER — Encounter: Payer: Self-pay | Admitting: Endocrinology

## 2016-09-01 VITALS — BP 136/84 | HR 64 | Ht 66.0 in | Wt 227.0 lb

## 2016-09-01 DIAGNOSIS — I10 Essential (primary) hypertension: Secondary | ICD-10-CM

## 2016-09-01 DIAGNOSIS — R531 Weakness: Secondary | ICD-10-CM

## 2016-09-01 DIAGNOSIS — R232 Flushing: Secondary | ICD-10-CM | POA: Insufficient documentation

## 2016-09-01 DIAGNOSIS — R61 Generalized hyperhidrosis: Secondary | ICD-10-CM

## 2016-09-01 NOTE — Progress Notes (Signed)
Subjective:    Patient ID: Sarah Palmer, female    DOB: 1956/02/08, 61 y.o.   MRN: [id number]  HPI Pt is referred by Dr Sabra Heck, for possible pheochromocytoma.  She has few years h/o HTN.  she has no h/o adrenal disease, diabetes, migraine headache, MTC, neurofibromas, hemangiomas, brain aneurysm, cocaine use, or thyroid disease.  She states intermittent moderate weakness, worst at the LE's, and assoc anxiety. She is unable to cite precip or relieving factor. She gets sxs 3-4 times per week, and they last 5 min-1 hr. She has not consumed EtOH in many years.  No recent menopausal sxs.   Past Medical History:  Diagnosis Date  . Acid reflux   . Anxiety   . Chest pain 2009   associated with palpitations; negative chest CT in 2009  . History of alcohol abuse 2009  . Hyperlipidemia    lipid profile in 2009:241, 122, 73, 144  . Hypertension   . Melanoma ([place]) 1999   resected from lower extremity  . Tobacco abuse     Past Surgical History:  Procedure Laterality Date  . COLONOSCOPY  never  . MELANOMA EXCISION  1999   lower extremity    Social History   Social History  . Marital status: Single    Spouse name: N/A  . Number of children: N/A  . 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 Smoking status: Former Smoker    Packs/day: 0.10    Years: 40.00    Types: Cigarettes    Quit date: 09/04/2013  . Smokeless tobacco: Former Systems developer    Quit date: 06/24/2012     Comment: first quit attempt in 06/2012  . Alcohol use No     Comment: excessive alcohol use in the past noted in 2009  . Drug use: No  . Sexual activity: Yes    Birth control/ protection: Post-menopausal   Other Topics Concern  . Not on file   Social History Narrative   Patient lives in [place] with her boyfriend of 74 he is    Current Outpatient Prescriptions on File Prior to Visit  Medication Sig Dispense Refill  . amLODipine (NORVASC)  5 MG tablet Take 1 tablet (5 mg total) by mouth daily. 90 tablet 1  . atenolol (TENORMIN) 100 MG tablet Take 200 mg by mouth daily.    Marland Kitchen losartan (COZAAR) 100 MG tablet Take 100 mg by mouth daily.    . pantoprazole (PROTONIX) 40 MG tablet Take 40 mg by mouth daily.    . traZODone (DESYREL) 50 MG tablet Take 1 tablet (50 mg total) by mouth at bedtime. 90 tablet 0   No current facility-administered medications on file prior to visit.     Allergies  Allergen Reactions  . Penicillins   . Thiazide-Type Diuretics Other (Palmer Comments)    SEVERE electrolyte disturbance (potassium 2.2 and sodium 114).      Family History  Problem Relation Age of Onset  . COPD Father   . Hypertension    . Other Neg Hx     pheochromocytoma    BP 136/84   Pulse 64   Ht 5\' 6"  (1.676 m)   Wt 227 lb (103 kg)   SpO2 97%   BMI 36.64 kg/m    Review of Systems denies headache, pallor, n/v, syncope, diarrhea, weight loss, chest pain, sob, visual loss, palpitations, fever, arthralgias, rhinorrhea,and  easy bruising.  She has insomnia, tremor, and excessive diaphoresis.     Objective:   Physical Exam VS: Palmer vs page GEN: no distress HEAD: head: no deformity eyes: no periorbital swelling, no proptosis external nose and ears are normal mouth: no lesion seen.  No neurofibromata.   NECK: supple, thyroid is not enlarged CHEST WALL: no deformity LUNGS: clear to auscultation CV: reg rate and rhythm, no murmur.  ABD: abdomen is soft, nontender.  no hepatosplenomegaly.  not distended.  no hernia MUSCULOSKELETAL: muscle bulk and strength are grossly normal.  no obvious joint swelling.  gait is normal and steady EXTEMITIES: trace bilat leg edema PULSES: no carotid bruit NEURO:  cn 2-12 grossly intact.   readily moves all 4's.  sensation is intact to touch on all 4's.  No tremor SKIN:  Normal texture and temperature.  No rash or suspicious lesion is visible.  Not diaphoretic.  No cafe-au-lait spots NODES:  None  palpable at the neck PSYCH: alert, well-oriented.  Does not appear anxious nor depressed.  I have reviewed outside records, and summarized: Pt was noted to have sxs of hot flashes and anxiety, and referred here.  HTN was rx'ed with tenormin  outside test results are reviewed: Serum norepinephrine=1069 (<874)      Assessment & Plan:  HTN, new to me, uncertain etiology Weakness, uncertain etiology.  pheo should be excluded.  Patient is advised the following: Patient Instructions  Please check a 24-HR urine test.  Also, right after you have symptoms, please come here to have blood tests taken.  Sometimes we need to check the tests several times, so I would be happy to recheck if the symptoms recur.

## 2016-09-01 NOTE — Patient Instructions (Signed)
Please check a 24-HR urine test.  Also, right after you have symptoms, please come here to have blood tests taken.  Sometimes we need to check the tests several times, so I would be happy to recheck if the symptoms recur.

## 2016-09-02 ENCOUNTER — Encounter: Payer: Self-pay | Admitting: Family Medicine

## 2016-09-02 ENCOUNTER — Encounter: Payer: Self-pay | Admitting: Endocrinology

## 2016-09-06 ENCOUNTER — Telehealth: Payer: Self-pay | Admitting: Endocrinology

## 2016-09-06 DIAGNOSIS — R61 Generalized hyperhidrosis: Secondary | ICD-10-CM

## 2016-09-06 LAB — CATECHOLAMINES, FRACTIONATED, URINE, 24 HOUR
CALCULATED TOTAL (E+ NE): 66 ug/(24.h) (ref 26–121)
Creatinine, Urine mg/day-CATEUR: 1.11 g/(24.h) (ref 0.63–2.50)
Dopamine, 24 hr Urine: 215 mcg/24 h (ref 52–480)
EPINEPHRINE, 24 HR URINE: 10 ug/(24.h) (ref 2–24)
NOREPINEPHRINE, 24 HR UR: 56 ug/(24.h) (ref 15–100)
Total Volume - CF 24Hr U: 1070 mL

## 2016-09-06 LAB — METANEPHRINES, URINE, 24 HOUR
METANEPHRINES UR: 113 ug/(24.h) (ref 90–315)
Metaneph Total, Ur: 537 mcg/24 h (ref 224–832)
Normetanephrine, 24H Ur: 424 mcg/24 h (ref 122–676)

## 2016-09-06 NOTE — Telephone Encounter (Signed)
What we have so far is normal

## 2016-09-06 NOTE — Telephone Encounter (Signed)
See message and please advise, Thanks!  

## 2016-09-06 NOTE — Telephone Encounter (Signed)
Calling for the results of labs

## 2016-09-07 NOTE — Telephone Encounter (Signed)
I contacted the patient and advised of message via voicemail. Requested a call back if the patient would like to discuss further.  

## 2016-09-09 ENCOUNTER — Other Ambulatory Visit: Payer: Managed Care, Other (non HMO)

## 2016-09-09 ENCOUNTER — Other Ambulatory Visit: Payer: Self-pay | Admitting: Endocrinology

## 2016-09-09 ENCOUNTER — Other Ambulatory Visit: Payer: Self-pay

## 2016-09-09 LAB — HM MAMMOGRAPHY

## 2016-09-09 NOTE — Telephone Encounter (Signed)
Pt is aware of the results being normal, pt states she though the calcitonin was supposed to be tested

## 2016-09-09 NOTE — Telephone Encounter (Signed)
I contacted the patient. When the patient dropped her 24 Hr urine test off she did not have the blood tests completed. Patient will come in on 09/12/2016 to have blood tests completed. Could you please reorder? Thanks!

## 2016-09-09 NOTE — Telephone Encounter (Signed)
done

## 2016-09-09 NOTE — Telephone Encounter (Signed)
See message and please advise, Thanks!  

## 2016-09-09 NOTE — Telephone Encounter (Signed)
please call lab: When will we get calcitonin result?

## 2016-09-12 ENCOUNTER — Other Ambulatory Visit: Payer: Managed Care, Other (non HMO)

## 2016-09-13 LAB — CATECHOLAMINES, FRACTIONATED, PLASMA
CATECHOLAMINES, TOTAL: 749 pg/mL
DOPAMINE: 34 pg/mL
Epinephrine: 21 pg/mL
Norepinephrine: 728 pg/mL

## 2016-09-13 LAB — CALCITONIN

## 2016-09-14 LAB — METANEPHRINES, PLASMA
Metanephrine, Free: <25 pg/mL (ref <=57)
Normetanephrine, Free: 91 pg/mL (ref <=148)
Total Metanephrines-Plasma: 91 pg/mL (ref <=205)

## 2016-12-14 ENCOUNTER — Ambulatory Visit: Payer: Managed Care, Other (non HMO) | Admitting: Hematology

## 2016-12-14 ENCOUNTER — Other Ambulatory Visit: Payer: Managed Care, Other (non HMO)

## 2017-01-09 NOTE — Progress Notes (Signed)
[name] is a 61 y.o.female, who is here today to follow on HTN and other chronic problems.   I saw her last on 08/12/16, when she had several concerns, I recommended 2 months f/u.  Since her last OV she has followed with oncologists, Dr Sarah Palmer, for leukocytosis. Also following with Dr Sarah Palmer (endocrinologists) to evaluate for possible pheochromocytoma.   For HTN she is currently Atenolol 200 mg daily , Cozaar 100 mg, and Amlodipine 5 mg. She is taking medications as instructed, no side effects reported.  BP's 130's/70's. Recently at her dentists' office was elevated but attributed to toothache. Last eye exam about 30 years ago.  She has not noted headache, visual changes, exertional chest pain, dyspnea,  focal weakness, or edema.    Lab Results  Component Value Date   CREATININE 0.7 08/16/2016   BUN 11.9 08/16/2016   NA 139 08/16/2016   K 3.7 08/16/2016   CL 91 (L) 06/03/2016   CO2 26 08/16/2016   Insomnia: Trazodone 50 mg is helping. She is sleeping about 6-7 hours total. She wakes up once and she is able to go back to sleep in a few minutes. She has not noted side effects.   HLD:  She is on non pharmacologic treatment. She has not been consistent with low fat diet,trying to do better.  Lab Results  Component Value Date   CHOL (H) 11/02/2007    241        ATP III CLASSIFICATION:  <200     mg/dL   Desirable  200-239  mg/dL   Borderline High  >=240    mg/dL   High   HDL 73 11/02/2007   LDLCALC (H) 11/02/2007    144        Total Cholesterol/HDL:CHD Risk Coronary Heart Disease Risk Table                     Men   Women  1/2 Average Risk   3.4   3.3   TRIG 122 11/02/2007   CHOLHDL 3.3 11/02/2007   For the past 3 weeks she is trying to walk a few times per week. 4 weeks of healthier diet.   COPD: She ahs not needed Albuterol since her last OV. + Former smoker. She has not had cough, wheezing,or dyspnea.    Review of Systems  Constitutional:  Positive for fatigue (no more than usual). Negative for activity change, appetite change, fever and unexpected weight change.  HENT: Negative for mouth sores, nosebleeds and trouble swallowing.   Eyes: Negative for redness and visual disturbance.  Respiratory: Negative for cough, shortness of breath and wheezing.   Cardiovascular: Negative for chest pain, palpitations and leg swelling.  Gastrointestinal: Negative for abdominal pain, nausea and vomiting.       Negative for changes in bowel habits.  Endocrine: Negative for cold intolerance, heat intolerance, polydipsia, polyphagia and polyuria.  Genitourinary: Negative for decreased urine volume and hematuria.  Musculoskeletal: Negative for gait problem and myalgias.  Skin: Negative for rash.  Neurological: Negative for syncope, weakness and headaches.  Psychiatric/Behavioral: Positive for sleep disturbance. Negative for confusion. The patient is nervous/anxious.      Current Outpatient Prescriptions on File Prior to Visit  Medication Sig Dispense Refill  . amLODipine (NORVASC) 5 MG tablet Take 1 tablet (5 mg total) by mouth daily. 90 tablet 1  . pantoprazole (PROTONIX) 40 MG tablet Take 40 mg by mouth daily.     No current  facility-administered medications on file prior to visit.      Past Medical History:  Diagnosis Date  . Acid reflux   . Anxiety   . Chest pain 2009   associated with palpitations; negative chest CT in 2009  . History of alcohol abuse 2009  . Hyperlipidemia    lipid profile in 2009:241, 122, 73, 144  . Hypertension   . Melanoma ([place]) 1999   resected from lower extremity  . Tobacco abuse     Allergies  Allergen Reactions  . Penicillins   . Thiazide-Type Diuretics Other (See Comments)    SEVERE electrolyte disturbance (potassium 2.2 and sodium 114).      Social History   Social History  . Marital status: Single    Spouse name: N/A  . Number of children: N/A  . 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 Smoking status: Former Smoker    Packs/day: 0.10    Years: 40.00    Types: Cigarettes    Quit date: 09/04/2013  . Smokeless tobacco: Former Systems developer    Quit date: 06/24/2012     Comment: first quit attempt in 06/2012  . Alcohol use No     Comment: excessive alcohol use in the past noted in 2009  . Drug use: No  . Sexual activity: Yes    Birth control/ protection: Post-menopausal   Other Topics Concern  . None   Social History Narrative   Patient lives in [place] with her boyfriend of 28 he is    Vitals:   01/10/17 0716  BP: 136/90  Pulse: 68  Resp: 12   O2 sat at RA 96%. Body mass index is 37.64 kg/m.  Wt Readings from Last 3 Encounters:  01/10/17 233 lb 3 oz (105.8 kg)  09/01/16 227 lb (103 kg)  08/16/16 229 lb 9.6 oz (104.1 kg)    Physical Exam  Nursing note and vitals reviewed. Constitutional: She is oriented to person, place, and time. She appears well-developed. No distress.  HENT:  Head: Atraumatic.  Mouth/Throat: Oropharynx is clear and moist and mucous membranes are normal.  Eyes: Conjunctivae and EOM are normal. Pupils are equal, round, and reactive to light.  Cardiovascular: Normal rate and regular rhythm.   No murmur heard. Pulses:      Dorsalis pedis pulses are 2+ on the right side, and 2+ on the left side.  Respiratory: Effort normal and breath sounds normal. No respiratory distress.  GI: Soft. She exhibits no mass. There is no hepatomegaly. There is no tenderness.  Musculoskeletal: She exhibits edema (trace pitting edema LE bilateral). She exhibits no tenderness.  Lymphadenopathy:    She has no cervical adenopathy.  Neurological: She is alert and oriented to person, place, and time. She has normal strength. Gait normal.  Skin: Skin is warm. No rash noted. No erythema.  Psychiatric: She has a normal mood and affect.  Appropriately groomed, good eye  contact.    ASSESSMENT AND PLAN:   [name] was seen today for follow-up.  Diagnoses and all orders for this visit:  Lab Results  Component Value Date   CREATININE 0.73 01/10/2017   BUN 14 01/10/2017   NA 139 01/10/2017   K 4.8 01/10/2017   CL 103 01/10/2017   CO2 28 01/10/2017   Lab Results  Component Value Date   CHOL 179 01/10/2017   HDL 52.80 01/10/2017   LDLCALC 111 (  H) 01/10/2017   TRIG 77.0 01/10/2017   CHOLHDL 3 01/10/2017    Insomnia, unspecified type  Otherwise well controlled. No changes in trazodone dose, some side effects discussed. Good sleep hygiene. F/U in 4-6 months.   -     traZODone (DESYREL) 50 MG tablet; Take 1 tablet (50 mg total) by mouth at bedtime.  Essential hypertension  Adequately controlled in general. She agrees with changing Cozaar for Benicar 40 mg. Atenolol decreased from 200 mg to 150 mg. No changes in Amlodipine for now. BP check in 6 weeks. DASH - low salt diet recommended. Eye exam recommended annually, she needs to arrange one. F/U in 4 months, before if needed.  -     olmesartan (BENICAR) 40 MG tablet; Take 1 tablet (40 mg total) by mouth daily. -     atenolol (TENORMIN) 100 MG tablet; Take 0.5 tablets (50 mg total) by mouth daily. -     Basic metabolic panel -     Lipid panel  Hyperlipidemia, unspecified hyperlipidemia type  Continue non pharmacologic treatment, we will follow labs done today and will give further recommendations accordingly. F/U in 6-12 months.  -     Lipid panel  Chronic obstructive pulmonary disease, unspecified COPD type ([place])  Asymptomatic. Continue Albuterol inh prn. F/U in 12 months,before if needed.  Class 2 obesity with serious comorbidity and body mass index (BMI) of 37.0 to 37.9 in adult, unspecified obesity type  She has gained about 6 Lb since her last OV. We discussed benefits of wt loss as well as adverse effects of obesity. Consistency with healthy diet and physical activity  recommended. Daily brisk walking for 15-30 min as tolerated.     -[name] was advised to return sooner than planned today if new concerns arise.     Crystina Borrayo G. Martinique, MD  [facility]. [place] office.

## 2017-01-10 ENCOUNTER — Encounter: Payer: Self-pay | Admitting: Family Medicine

## 2017-01-10 ENCOUNTER — Ambulatory Visit (INDEPENDENT_AMBULATORY_CARE_PROVIDER_SITE_OTHER): Payer: 59 | Admitting: Family Medicine

## 2017-01-10 VITALS — BP 136/90 | HR 68 | Resp 12 | Ht 66.0 in | Wt 233.2 lb

## 2017-01-10 DIAGNOSIS — E669 Obesity, unspecified: Secondary | ICD-10-CM | POA: Insufficient documentation

## 2017-01-10 DIAGNOSIS — Z6837 Body mass index (BMI) 37.0-37.9, adult: Secondary | ICD-10-CM | POA: Diagnosis not present

## 2017-01-10 DIAGNOSIS — IMO0001 Reserved for inherently not codable concepts without codable children: Secondary | ICD-10-CM

## 2017-01-10 DIAGNOSIS — I10 Essential (primary) hypertension: Secondary | ICD-10-CM

## 2017-01-10 DIAGNOSIS — E785 Hyperlipidemia, unspecified: Secondary | ICD-10-CM

## 2017-01-10 DIAGNOSIS — J449 Chronic obstructive pulmonary disease, unspecified: Secondary | ICD-10-CM

## 2017-01-10 DIAGNOSIS — G4700 Insomnia, unspecified: Secondary | ICD-10-CM

## 2017-01-10 LAB — BASIC METABOLIC PANEL
BUN: 14 mg/dL (ref 6–23)
CO2: 28 meq/L (ref 19–32)
Calcium: 9.2 mg/dL (ref 8.4–10.5)
Chloride: 103 mEq/L (ref 96–112)
Creatinine, Ser: 0.73 mg/dL (ref 0.40–1.20)
GFR: 86.41 mL/min (ref >60.00)
GLUCOSE: 94 mg/dL (ref 70–99)
POTASSIUM: 4.8 meq/L (ref 3.5–5.1)
Sodium: 139 mEq/L (ref 135–145)

## 2017-01-10 LAB — LIPID PANEL
Cholesterol: 179 mg/dL (ref 0–200)
HDL: 52.8 mg/dL (ref >39.00)
LDL Cholesterol: 111 mg/dL — ABNORMAL HIGH (ref 0–99)
NonHDL: 126.4
TRIGLYCERIDES: 77 mg/dL (ref 0.0–149.0)
Total CHOL/HDL Ratio: 3
VLDL: 15.4 mg/dL (ref 0.0–40.0)

## 2017-01-10 MED ORDER — OLMESARTAN MEDOXOMIL 40 MG PO TABS: 40.0000 mg | ORAL_TABLET | Freq: Every day | ORAL | 2 refills | Status: DC

## 2017-01-10 MED ORDER — ATENOLOL 100 MG PO TABS: 1.5000 mg | ORAL_TABLET | Freq: Every day | ORAL | 1 refills | Status: DC

## 2017-01-10 NOTE — Patient Instructions (Signed)
A few things to remember from today's visit:   Essential hypertension - Plan: atenolol (TENORMIN) 100 MG tablet, Basic metabolic panel, Lipid panel  Insomnia, unspecified type  Cozaar stopped, Benicar started,Atenolol decreased to 1.5 tab.  Blood pressure goal for most people is less than 140/90. Some populations (older than 60) the goal is less than 150/90.  Most recent cardiologists' recommendations recommend blood pressure at or less than 130/80.   Elevated blood pressure increases the risk of strokes, heart and kidney disease, and eye problems. Regular physical activity and a healthy diet (DASH diet) usually help. Low salt diet. Take medications as instructed.  Caution with some over the counter medications as cold medications, dietary products (for weight loss), and Ibuprofen or Aleve (frequent use);all these medications could cause elevation of blood pressure.    Please be sure medication list is accurate. If a new problem present, please set up appointment sooner than planned today.

## 2017-01-11 ENCOUNTER — Encounter: Payer: Self-pay | Admitting: Family Medicine

## 2017-01-11 MED ORDER — TRAZODONE HCL 50 MG PO TABS: 50.0000 mg | ORAL_TABLET | Freq: Every day | ORAL | 1 refills | Status: DC

## 2017-01-16 ENCOUNTER — Telehealth: Payer: Self-pay | Admitting: Family Medicine

## 2017-01-16 NOTE — Telephone Encounter (Signed)
Please advise 

## 2017-01-16 NOTE — Telephone Encounter (Signed)
It was decreased from 200 mg (2 tabs) to 150 mg (1.5 tab). The plan is trying to decrease dose to 100 mg but gradually.  Thanks, BJ

## 2017-01-16 NOTE — Telephone Encounter (Signed)
Called and spoke with patient. Went over information below, patient verbalized understanding.

## 2017-01-16 NOTE — Telephone Encounter (Signed)
Pt states she cannot remember how much  atenolol (TENORMIN) 100 MG tablet  Pt thinks she was told 150 mg but Rx for 100 mg / .05 daily. Pt needs clarification.

## 2017-02-15 ENCOUNTER — Other Ambulatory Visit: Payer: Self-pay | Admitting: Family Medicine

## 2017-02-15 DIAGNOSIS — G4700 Insomnia, unspecified: Secondary | ICD-10-CM

## 2017-03-14 ENCOUNTER — Telehealth: Payer: Self-pay | Admitting: Family Medicine

## 2017-03-14 MED ORDER — ATENOLOL 100 MG PO TABS: 150.0000 mg | ORAL_TABLET | Freq: Every day | ORAL | 1 refills | Status: DC

## 2017-03-14 NOTE — Telephone Encounter (Signed)
Pt needs new rx atenolol 75 mg. Pt will take twice a day.#180 send to [facility]

## 2017-03-14 NOTE — Telephone Encounter (Signed)
Rx sent in. There is no 75 mg tablet, so 100 mg sent it to take 1.5 tablets daily.

## 2017-03-30 ENCOUNTER — Encounter: Payer: Self-pay | Admitting: Family Medicine

## 2017-04-04 ENCOUNTER — Other Ambulatory Visit: Payer: Self-pay | Admitting: Family Medicine

## 2017-04-04 DIAGNOSIS — I10 Essential (primary) hypertension: Secondary | ICD-10-CM

## 2017-04-06 ENCOUNTER — Other Ambulatory Visit: Payer: Self-pay | Admitting: Family Medicine

## 2017-04-06 DIAGNOSIS — I10 Essential (primary) hypertension: Secondary | ICD-10-CM

## 2017-04-07 ENCOUNTER — Telehealth: Payer: Self-pay | Admitting: Family Medicine

## 2017-04-07 NOTE — Telephone Encounter (Signed)
Error/njr °

## 2017-05-04 ENCOUNTER — Telehealth: Payer: Self-pay | Admitting: Family Medicine

## 2017-05-04 NOTE — Telephone Encounter (Signed)
° ° °  Looks likeDr Martinique has never filled this med before   Pt request refill of the following:  pantoprazole (PROTONIX) 40 MG tablet  Phamacy:  [facility]

## 2017-05-05 NOTE — Telephone Encounter (Signed)
Is this okay to fill? 

## 2017-05-07 ENCOUNTER — Other Ambulatory Visit: Payer: Self-pay | Admitting: Family Medicine

## 2017-05-07 DIAGNOSIS — K219 Gastro-esophageal reflux disease without esophagitis: Secondary | ICD-10-CM

## 2017-05-07 MED ORDER — PANTOPRAZOLE SODIUM 40 MG PO TBEC: 40.0000 mg | DELAYED_RELEASE_TABLET | Freq: Every day | ORAL | 1 refills | Status: DC

## 2017-05-07 NOTE — Telephone Encounter (Signed)
Rx for Protonix was sent. Please remind pt, she is due for follow up on HTN and some of her chronic medical problems in 05/2018. Thanks, BJ

## 2017-05-08 NOTE — Telephone Encounter (Signed)
Left message for patient, letting her know med was sent to pharmacy.

## 2017-07-08 ENCOUNTER — Other Ambulatory Visit: Payer: Self-pay | Admitting: Family Medicine

## 2017-07-08 DIAGNOSIS — K219 Gastro-esophageal reflux disease without esophagitis: Secondary | ICD-10-CM

## 2017-08-03 ENCOUNTER — Other Ambulatory Visit: Payer: Self-pay | Admitting: Family Medicine

## 2017-08-03 DIAGNOSIS — K219 Gastro-esophageal reflux disease without esophagitis: Secondary | ICD-10-CM

## 2017-08-27 ENCOUNTER — Other Ambulatory Visit: Payer: Self-pay | Admitting: Family Medicine

## 2017-09-01 ENCOUNTER — Other Ambulatory Visit: Payer: Self-pay | Admitting: Family Medicine

## 2017-09-01 DIAGNOSIS — K219 Gastro-esophageal reflux disease without esophagitis: Secondary | ICD-10-CM

## 2017-10-01 ENCOUNTER — Other Ambulatory Visit: Payer: Self-pay | Admitting: Family Medicine

## 2017-10-01 DIAGNOSIS — I10 Essential (primary) hypertension: Secondary | ICD-10-CM

## 2017-10-27 ENCOUNTER — Other Ambulatory Visit: Payer: Self-pay | Admitting: Family Medicine

## 2017-10-27 DIAGNOSIS — K219 Gastro-esophageal reflux disease without esophagitis: Secondary | ICD-10-CM

## 2017-11-22 ENCOUNTER — Other Ambulatory Visit: Payer: Self-pay | Admitting: Family Medicine

## 2017-11-22 DIAGNOSIS — K219 Gastro-esophageal reflux disease without esophagitis: Secondary | ICD-10-CM

## 2017-12-23 ENCOUNTER — Other Ambulatory Visit: Payer: Self-pay | Admitting: Family Medicine

## 2017-12-23 DIAGNOSIS — K219 Gastro-esophageal reflux disease without esophagitis: Secondary | ICD-10-CM

## 2018-01-15 ENCOUNTER — Other Ambulatory Visit: Payer: Self-pay | Admitting: Family Medicine

## 2018-01-15 DIAGNOSIS — G4700 Insomnia, unspecified: Secondary | ICD-10-CM

## 2018-01-19 ENCOUNTER — Ambulatory Visit: Payer: 59 | Admitting: Family Medicine

## 2018-01-19 ENCOUNTER — Encounter: Payer: Self-pay | Admitting: Family Medicine

## 2018-01-19 VITALS — BP 140/62 | HR 62 | Temp 98.1°F | Resp 12 | Ht 66.0 in | Wt 229.5 lb

## 2018-01-19 DIAGNOSIS — I10 Essential (primary) hypertension: Secondary | ICD-10-CM | POA: Diagnosis not present

## 2018-01-19 DIAGNOSIS — Z1211 Encounter for screening for malignant neoplasm of colon: Secondary | ICD-10-CM | POA: Diagnosis not present

## 2018-01-19 DIAGNOSIS — K219 Gastro-esophageal reflux disease without esophagitis: Secondary | ICD-10-CM | POA: Diagnosis not present

## 2018-01-19 DIAGNOSIS — E785 Hyperlipidemia, unspecified: Secondary | ICD-10-CM

## 2018-01-19 DIAGNOSIS — Z9189 Other specified personal risk factors, not elsewhere classified: Secondary | ICD-10-CM | POA: Diagnosis not present

## 2018-01-19 DIAGNOSIS — G4700 Insomnia, unspecified: Secondary | ICD-10-CM | POA: Diagnosis not present

## 2018-01-19 DIAGNOSIS — Z1159 Encounter for screening for other viral diseases: Secondary | ICD-10-CM | POA: Diagnosis not present

## 2018-01-19 LAB — LIPID PANEL
CHOL/HDL RATIO: 4
Cholesterol: 212 mg/dL — ABNORMAL HIGH (ref 0–200)
HDL: 55.6 mg/dL (ref >39.00)
LDL CALC: 128 mg/dL — AB (ref 0–99)
NonHDL: 156.73
Triglycerides: 146 mg/dL (ref 0.0–149.0)
VLDL: 29.2 mg/dL (ref 0.0–40.0)

## 2018-01-19 LAB — COMPREHENSIVE METABOLIC PANEL
ALT: 12 U/L (ref 0–35)
AST: 12 U/L (ref 0–37)
Albumin: 4.3 g/dL (ref 3.5–5.2)
Alkaline Phosphatase: 96 U/L (ref 39–117)
BUN: 12 mg/dL (ref 6–23)
CALCIUM: 9.2 mg/dL (ref 8.4–10.5)
CHLORIDE: 101 meq/L (ref 96–112)
CO2: 30 meq/L (ref 19–32)
Creatinine, Ser: 0.75 mg/dL (ref 0.40–1.20)
GFR: 83.47 mL/min (ref >60.00)
Glucose, Bld: 91 mg/dL (ref 70–99)
Potassium: 4.2 mEq/L (ref 3.5–5.1)
Sodium: 139 mEq/L (ref 135–145)
Total Bilirubin: 0.4 mg/dL (ref 0.2–1.2)
Total Protein: 6.6 g/dL (ref 6.0–8.3)

## 2018-01-19 MED ORDER — TRAZODONE HCL 50 MG PO TABS
50.0000 mg | ORAL_TABLET | Freq: Every day | ORAL | 1 refills | Status: DC
Start: 2018-01-19 — End: 2018-06-22

## 2018-01-19 MED ORDER — OLMESARTAN MEDOXOMIL 40 MG PO TABS: 40.0000 mg | ORAL_TABLET | Freq: Every day | ORAL | 1 refills | Status: DC

## 2018-01-19 MED ORDER — AMLODIPINE BESYLATE 5 MG PO TABS: 5.0000 mg | ORAL_TABLET | Freq: Every day | ORAL | 1 refills | Status: DC

## 2018-01-19 MED ORDER — PANTOPRAZOLE SODIUM 40 MG PO TBEC: 40.0000 mg | DELAYED_RELEASE_TABLET | Freq: Every day | ORAL | 0 refills | Status: DC | PRN

## 2018-01-19 MED ORDER — ATENOLOL 100 MG PO TABS: 100.0000 mg | ORAL_TABLET | Freq: Every day | ORAL | 1 refills | Status: DC

## 2018-01-19 NOTE — Patient Instructions (Signed)
A few things to remember from today's visit:   Colon cancer screening - Plan: Cologuard  Essential hypertension - Plan: Comprehensive metabolic panel, Lipid panel, amLODipine (NORVASC) 5 MG tablet, olmesartan (BENICAR) 40 MG tablet  Gastroesophageal reflux disease, esophagitis presence not specified - Plan: pantoprazole (PROTONIX) 40 MG tablet  Insomnia, unspecified type - Plan: traZODone (DESYREL) 50 MG tablet  Encounter for HCV screening test for high risk patient - Plan: Hepatitis C antibody  Atenolol decreased to 100 mg  And Amlodipine resumed. Rest unchanged.   Please be sure medication list is accurate. If a new problem present, please set up appointment sooner than planned today.

## 2018-01-19 NOTE — Progress Notes (Signed)
HPI:   Sarah Palmer is a 62 y.o. female, who is here today for follow up.   She was last seen on 01/10/17.   Hypertension:   Currently on Atenolol 150 mg daily and Benicar 40 mg daily. She stopped Amlodipine 5 mg months ago,ran out.  Yesterday at her dentis' office BP was  160/? Home reading 140's/80's.  Last eye exam over a year ago. HR low 60's.  She has not noted side effects reported.  Negative for unusual headache, visual changes, exertional chest pain, dyspnea,  focal weakness, or edema.   Lab Results  Component Value Date   CREATININE 0.73 01/10/2017   BUN 14 01/10/2017   NA 139 01/10/2017   K 4.8 01/10/2017   CL 103 01/10/2017   CO2 28 01/10/2017    Insomnia and anxiety:  She is on Trazodone 50 mg daily, sleeping better.It has also helped with anxiety. Since her last OV her husband was Dx with stage 4 colon cancer.  She wakes up in the middle of the night and able to go back to sleep easier. Sleeps about 7-8 hours. No side effects noted.   GERD:  She is Protonix 40 mg daily. Acid reflux and heartburn resolved since he has been on PPI.  Denies abdominal pain, nausea, vomiting, changes in bowel habits, blood in stool or melena.      Health maintenance:  She has not had colonoscopy done. Pap smear in 2017. She has not had HCV screening.     Review of Systems  Constitutional: Negative for activity change, appetite change, fatigue and fever.  HENT: Negative for mouth sores, nosebleeds and trouble swallowing.   Eyes: Negative for redness and visual disturbance.  Respiratory: Negative for cough, shortness of breath and wheezing.   Cardiovascular: Negative for chest pain, palpitations and leg swelling.  Gastrointestinal: Negative for abdominal pain, nausea and vomiting.       Negative for changes in bowel habits.  Endocrine: Negative for cold intolerance and heat intolerance.  Genitourinary: Negative for decreased urine volume,  dysuria and hematuria.  Musculoskeletal: Negative for gait problem and myalgias.  Neurological: Negative for syncope, weakness and headaches.  Psychiatric/Behavioral: Positive for sleep disturbance. Negative for confusion. The patient is nervous/anxious.     No current outpatient medications on file prior to visit.   No current facility-administered medications on file prior to visit.      Past Medical History:  Diagnosis Date  . Acid reflux   . Anxiety   . Chest pain 2009   associated with palpitations; negative chest CT in 2009  . History of alcohol abuse 2009  . Hyperlipidemia    lipid profile in 2009:241, 122, 73, 144  . Hypertension   . Melanoma ([place]) 1999   resected from lower extremity  . Tobacco abuse    Allergies  Allergen Reactions  . Penicillins   . Thiazide-Type Diuretics Other (See Comments)    SEVERE electrolyte disturbance (potassium 2.2 and sodium 114).      Social History   Socioeconomic History  . Marital status: Single    Spouse name: Not on file  . Number of children: Not on file  . Years of education: Not on file  . Highest education level: Not on file  Occupational History  . Occupation: Banker for [employer]  . Financial resource strain: Not on file  . Food insecurity:    Worry: Not on file    Inability: Not  on file  . Transportation needs:    Medical: Not on file    Non-medical: Not on file  Tobacco Use  . Smoking status: Former Smoker    Packs/day: 0.10    Years: 40.00    Pack years: 4.00    Types: Cigarettes    Last attempt to quit: 09/04/2013    Years since quitting: 4.3  . Smokeless tobacco: Former Systems developer    Quit date: 06/24/2012  . Tobacco comment: first quit attempt in 06/2012  Substance and Sexual Activity  . Alcohol use: No    Alcohol/week: 1.2 - 1.8 oz    Types: 2 - 3 Standard drinks or equivalent per week    Comment: excessive alcohol use in the past noted in 2009  . Drug use: No  . Sexual  activity: Yes    Birth control/protection: Post-menopausal  Lifestyle  . Physical activity:    Days per week: Not on file    Minutes per session: Not on file  . Stress: Not on file  Relationships  . Social connections:    Talks on phone: Not on file    Gets together: Not on file    Attends religious service: Not on file    Active member of club or organization: Not on file    Attends meetings of clubs or organizations: Not on file    Relationship status: Not on file  Other Topics Concern  . Not on file  Social History Narrative   Patient lives in [place] with her boyfriend of 54 he is    Vitals:   01/19/18 0740  BP: 140/62  Pulse: 62  Resp: 12  Temp: 98.1 F (36.7 C)  SpO2: 97%   Body mass index is 37.04 kg/m.    Physical Exam  Nursing note and vitals reviewed. Constitutional: She is oriented to person, place, and time. She appears well-developed. No distress.  HENT:  Head: Normocephalic and atraumatic.  Mouth/Throat: Oropharynx is clear and moist and mucous membranes are normal.  Eyes: Pupils are equal, round, and reactive to light. Conjunctivae are normal.  Cardiovascular: Normal rate and regular rhythm.  No murmur heard. Pulses:      Dorsalis pedis pulses are 2+ on the right side, and 2+ on the left side.  Respiratory: Effort normal and breath sounds normal. No respiratory distress.  GI: Soft. She exhibits no mass. There is no hepatomegaly. There is no tenderness.  Musculoskeletal: She exhibits edema (Trace pitting LE edema,bilateral.). She exhibits no tenderness.  Lymphadenopathy:    She has no cervical adenopathy.  Neurological: She is alert and oriented to person, place, and time. She has normal strength. Gait normal. She displays no Babinski's sign on the right side.  Skin: Skin is warm. No rash noted. No erythema.  Psychiatric: She has a normal mood and affect.  Well groomed, good eye contact.       ASSESSMENT AND PLAN:   [name] was  seen today for follow-up.  Orders Placed This Encounter  Procedures  . Cologuard  . Comprehensive metabolic panel  . Lipid panel  . Hepatitis C antibody    Lab Results  Component Value Date   CREATININE 0.75 01/19/2018   BUN 12 01/19/2018   NA 139 01/19/2018   K 4.2 01/19/2018   CL 101 01/19/2018   CO2 30 01/19/2018   Lab Results  Component Value Date   ALT 12 01/19/2018   AST 12 01/19/2018   ALKPHOS 96 01/19/2018   BILITOT  0.4 01/19/2018   Lab Results  Component Value Date   CHOL 212 (H) 01/19/2018   HDL 55.60 01/19/2018   LDLCALC 128 (H) 01/19/2018   TRIG 146.0 01/19/2018   CHOLHDL 4 01/19/2018   .  1. Essential hypertension  Slightly elevated SBP and elevated BP's at her dentis's office. She will resume Amlodipine. Decrease Atenolol from 150 mg to 100 mg daily. No changes in Benicar. Low salt diet recommended. Eye exam recommended annually. F/U in 6 months, before if needed.  - Comprehensive metabolic panel - Lipid panel - amLODipine (NORVASC) 5 MG tablet; Take 1 tablet (5 mg total) by mouth daily.  Dispense: 90 tablet; Refill: 1 - olmesartan (BENICAR) 40 MG tablet; Take 1 tablet (40 mg total) by mouth daily.  Dispense: 90 tablet; Refill: 1  2. Gastroesophageal reflux disease, esophagitis presence not specified  Well controlled. Some side effects or PPI's discussed. She will try to skip doses and take Protonix prn. GERD precautions to continue. F/U in 6 months.  - pantoprazole (PROTONIX) 40 MG tablet; Take 1 tablet (40 mg total) by mouth daily as needed.  Dispense: 90 tablet; Refill: 0  3. Insomnia, unspecified type  Well controlled. No changes in current management. Good sleep hygiene also recommended. F/U in 6 ,onths.  - traZODone (DESYREL) 50 MG tablet; Take 1 tablet (50 mg total) by mouth at bedtime.  Dispense: 90 tablet; Refill: 1  4. Hyperlipidemia, unspecified hyperlipidemia type  No changes in current management, will follow labs done  today and will give further recommendations accordingly.  The 10-year ASCVD risk score Mikey Bussing DC Brooke Bonito., et al., 2013) is: 6.1%   Values used to calculate the score:     Age: 85 years     Sex: Female     Is Non-Hispanic African American: No     Diabetic: No     Tobacco smoker: No     Systolic Blood Pressure: 132 mmHg     Is BP treated: Yes     HDL Cholesterol: 55.6 mg/dL     Total Cholesterol: 212 mg/dL  5. Colon cancer screening  She has not been able to have colonoscopy done because her husband's illness. We discussed other options,she would like Cologuard.  - Cologuard  6. Encounter for HCV screening test for high risk patient - Hepatitis C antibody      Betty G. Martinique, MD  [facility] [place] [facility].

## 2018-01-20 LAB — HEPATITIS C ANTIBODY
Hepatitis C Ab: NONREACTIVE
SIGNAL TO CUT-OFF: 0.01 (ref <1.00)

## 2018-01-21 ENCOUNTER — Encounter: Payer: Self-pay | Admitting: Family Medicine

## 2018-01-26 ENCOUNTER — Ambulatory Visit: Payer: 59 | Admitting: Family Medicine

## 2018-02-15 ENCOUNTER — Other Ambulatory Visit: Payer: Self-pay | Admitting: Family Medicine

## 2018-03-19 ENCOUNTER — Telehealth: Payer: Self-pay | Admitting: *Deleted

## 2018-03-19 NOTE — Telephone Encounter (Signed)
Patient requests new Rx, Patient paying $71/Fill for Olmesartan 40 mg, lower cost alternative: Losartan 100 mg, $31/fill

## 2018-03-20 NOTE — Telephone Encounter (Signed)
It is Ok to replace Benicar for Losartan 100 mg daily. F/U appt in 2-3 months. Monitor BP at home. Thanks, BJ

## 2018-03-21 ENCOUNTER — Other Ambulatory Visit: Payer: Self-pay | Admitting: *Deleted

## 2018-03-21 MED ORDER — LOSARTAN POTASSIUM 100 MG PO TABS: 100.0000 mg | ORAL_TABLET | Freq: Every day | ORAL | 3 refills | Status: DC

## 2018-03-21 NOTE — Telephone Encounter (Signed)
Rx sent to pharmacy as requested.

## 2018-04-26 ENCOUNTER — Other Ambulatory Visit: Payer: Self-pay | Admitting: Family Medicine

## 2018-04-26 DIAGNOSIS — K219 Gastro-esophageal reflux disease without esophagitis: Secondary | ICD-10-CM

## 2018-06-01 ENCOUNTER — Ambulatory Visit: Payer: 59 | Admitting: Internal Medicine

## 2018-06-01 ENCOUNTER — Encounter: Payer: Self-pay | Admitting: Internal Medicine

## 2018-06-01 ENCOUNTER — Telehealth: Payer: Self-pay | Admitting: *Deleted

## 2018-06-01 VITALS — BP 140/80 | HR 67 | Temp 98.1°F | Wt 216.6 lb

## 2018-06-01 DIAGNOSIS — J069 Acute upper respiratory infection, unspecified: Secondary | ICD-10-CM

## 2018-06-01 DIAGNOSIS — B9789 Other viral agents as the cause of diseases classified elsewhere: Secondary | ICD-10-CM | POA: Diagnosis not present

## 2018-06-01 DIAGNOSIS — F5102 Adjustment insomnia: Secondary | ICD-10-CM

## 2018-06-01 NOTE — Progress Notes (Signed)
Acute Office Visit     CC/Reason for Visit: Body aches, fevers, chills, cough  HPI: Sarah Palmer is a 62 y.o. female who is coming in today for the above mentioned reasons.  Unfortunately her husband passed away 3 weeks ago after short battle with stage IV colon cancer.  She describes appropriate bereavement with insomnia, anxiety.  About a week after he died, she started having subjective fevers, chills, runny nose, sore throat, right ear pain.  She is coming in today for evaluation of these acute issues.  She had quit smoking, but resumed over the summer with her husband's illness and is still smoking a few cigarettes a day.  Past Medical/Surgical History: Past Medical History:  Diagnosis Date  . Acid reflux   . Anxiety   . Chest pain 2009   associated with palpitations; negative chest CT in 2009  . History of alcohol abuse 2009  . Hyperlipidemia    lipid profile in 2009:241, 122, 73, 144  . Hypertension   . Melanoma ([place]) 1999   resected from lower extremity  . Tobacco abuse     Past Surgical History:  Procedure Laterality Date  . COLONOSCOPY  never  . MELANOMA EXCISION  1999   lower extremity    Social History:  reports that she quit smoking about 4 years ago. Her smoking use included cigarettes. She has a 4.00 pack-year smoking history. She quit smokeless tobacco use about 5 years ago. She reports that she does not drink alcohol or use drugs.  Allergies: Allergies  Allergen Reactions  . Penicillins   . Thiazide-Type Diuretics Other (See Comments)    SEVERE electrolyte disturbance (potassium 2.2 and sodium 114).      Family History:  Family History  Problem Relation Age of Onset  . COPD Father   . Hypertension Unknown   . Other Neg Hx        pheochromocytoma     Current Outpatient Medications:  .  amLODipine (NORVASC) 5 MG tablet, Take 1 tablet (5 mg total) by mouth daily., Disp: 90 tablet, Rfl: 1 .  atenolol (TENORMIN) 100 MG tablet, Take 1 tablet  (100 mg total) by mouth daily., Disp: 90 tablet, Rfl: 1 .  losartan (COZAAR) 100 MG tablet, Take 1 tablet (100 mg total) by mouth daily., Disp: 90 tablet, Rfl: 3 .  olmesartan (BENICAR) 40 MG tablet, Take 40 mg by mouth daily., Disp: , Rfl: 1 .  pantoprazole (PROTONIX) 40 MG tablet, TAKE 1 TABLET (40 MG TOTAL) BY MOUTH DAILY AS NEEDED., Disp: 90 tablet, Rfl: 0 .  traZODone (DESYREL) 50 MG tablet, Take 1 tablet (50 mg total) by mouth at bedtime., Disp: 90 tablet, Rfl: 1  Review of Systems:  Constitutional: Positive for fever, chills, diaphoresis, appetite change and fatigue.  HEENT: Denies photophobia, eye pain, redness, hearing loss, complains of ear pain, congestion, sore throat, rhinorrhea, sneezing, denies mouth sores, trouble swallowing, neck pain, neck stiffness and tinnitus.   Respiratory: Denies SOB, DOE, cough, chest tightness,  and wheezing.   Cardiovascular: Denies chest pain, palpitations and leg swelling.  Gastrointestinal: Denies nausea, vomiting, abdominal pain, diarrhea, constipation, blood in stool and abdominal distention.  Genitourinary: Denies dysuria, urgency, frequency, hematuria, flank pain and difficulty urinating.  Endocrine: Denies: hot or cold intolerance, sweats, changes in hair or nails, polyuria, polydipsia. Musculoskeletal: Denies myalgias, back pain, joint swelling, arthralgias and gait problem.  Skin: Denies pallor, rash and wound.  Neurological: Denies dizziness, seizures, syncope, weakness, light-headedness, numbness and  headaches.  Hematological: Denies adenopathy. Easy bruising, personal or family bleeding history  Psychiatric/Behavioral: Denies suicidal ideation, mood changes, confusion, nervousness, sleep disturbance and agitation    Physical Exam: Vitals:   06/01/18 0707  BP: 140/80  Pulse: 67  Temp: 98.1 F (36.7 C)  TempSrc: Oral  SpO2: 94%  Weight: 216 lb 9.6 oz (98.2 kg)    Body mass index is 34.96 kg/m.    Constitutional: NAD, calm,  comfortable Eyes: PERRL, lids and conjunctivae normal ENMT: Mucous membranes are moist. Posterior pharynx clear of any exudate or lesions. Normal dentition. Tympanic membrane is pearly white, no erythema or bulging. Neck: normal, supple, no masses, no thyromegaly Respiratory: clear to auscultation bilaterally, no wheezing, no crackles. Normal respiratory effort. No accessory muscle use.  Cardiovascular: Regular rate and rhythm, no murmurs / rubs / gallops. No extremity edema. 2+ pedal pulses. No carotid bruits.  Abdomen: no tenderness, no masses palpated. No hepatosplenomegaly. Bowel sounds positive.  Musculoskeletal: no clubbing / cyanosis. No joint deformity upper and lower extremities. Good ROM, no contractures. Normal muscle tone.  Skin: no rashes, lesions, ulcers. No induration Neurologic: CN 2-12 grossly intact. Sensation intact, DTR normal. Strength 5/5 in all 4.  Psychiatric: Normal judgment and insight. Alert and oriented x 3. Normal mood.    Impression and Plan:  Adjustment insomnia -She is in an appropriate bereavement phase after the recent passing of her husband. -She wants prescription sleeping medication today. -Have told her that this is something that she will need to discuss with Dr. Martinique, however given her concomitant upper respiratory infection she may benefit from Benadryl at nighttime temporarily.  Viral URI with cough -Lungs are clear, not febrile today, good oxygen saturations.  Not concerned for pneumonia at this time. -Advised on OTC measures and to return to clinic if not improving within 10 to 14 days.    Patient Instructions  -Based on what we have discussed today and your exam I believe you likely have an upper respiratory viral infection.  Your lungs are clear so pneumonia is unlikely.  Would recommend over-the-counter measures such as pain relievers, nasal decongestants, throat lozenges.  Please return if you are not better within 10 to 14 days.  -I am  very sorry to hear about your husband's passing.  You may use some Benadryl temporarily to assist with your sleeping issues.  We will schedule you in to see Dr. Martinique to further discuss your concerns with sleeping medication.  -Follow-up with Dr. Martinique (PCP)  in 2 weeks.     Lelon Frohlich, MD [place] [facility]

## 2018-06-01 NOTE — Telephone Encounter (Signed)
Patient here for an office visit.  Her sinus/cough cold was addressed by Dr Jerilee Hoh. Patient also explained that her husband had past away 2 weeks ago.  She is now complaining of increased insomnia.  She would like to know if hertraZODone (DESYREL) 50 MG tablet  can be increased or a new prescription be called in?

## 2018-06-01 NOTE — Telephone Encounter (Signed)
She can try to increase trazodone from 50 mg to 75 mg (1.5 tablet) and if needed up to 100 mg (2 tablets). Follow-up appointment in 4 weeks. Thanks, BJ

## 2018-06-01 NOTE — Patient Instructions (Signed)
-  Based on what we have discussed today and your exam I believe you likely have an upper respiratory viral infection.  Your lungs are clear so pneumonia is unlikely.  Would recommend over-the-counter measures such as pain relievers, nasal decongestants, throat lozenges.  Please return if you are not better within 10 to 14 days.  -I am very sorry to hear about your husband's passing.  You may use some Benadryl temporarily to assist with your sleeping issues.  We will schedule you in to see Dr. Martinique to further discuss your concerns with sleeping medication.  -Follow-up with Dr. Martinique (PCP)  in 2 weeks.

## 2018-06-01 NOTE — Telephone Encounter (Signed)
Left message on machine for patient to return our call CRM 

## 2018-06-04 NOTE — Telephone Encounter (Signed)
Patient called back but no PEC RB available.

## 2018-06-05 NOTE — Telephone Encounter (Signed)
Patient called and advised of Dr. Doug Sou note below on 06/01/18, patient verbalized understanding. She says she will call back tomorrow to schedule her 4 week follow up appointment.

## 2018-06-05 NOTE — Telephone Encounter (Signed)
Pt calling back. Please advise.

## 2018-06-05 NOTE — Telephone Encounter (Signed)
Left message on machine for patient to return our call CRM 

## 2018-06-14 ENCOUNTER — Telehealth: Payer: Self-pay

## 2018-06-14 NOTE — Telephone Encounter (Signed)
Copied from [place] [id number]. Topic: General - Inquiry >> Jun 14, 2018  8:13 AM Conception Chancy, NT wrote: Reason for CRM: patient is calling and states that her husband past away on 05/14/18 and she has not felt well since then. She states her back has been hurting, her stomach, also has anxiety. She is requesting to get blood work done. Please advise if orders are placed.

## 2018-06-16 DIAGNOSIS — Z1211 Encounter for screening for malignant neoplasm of colon: Secondary | ICD-10-CM | POA: Diagnosis not present

## 2018-06-16 LAB — COLOGUARD: Cologuard: POSITIVE

## 2018-06-18 ENCOUNTER — Telehealth: Payer: Self-pay | Admitting: Family Medicine

## 2018-06-18 NOTE — Telephone Encounter (Signed)
Copied from [place] [id number]. Topic: General - Other >> Jun 18, 2018 10:49 AM Lennox Solders wrote: Reason for CRM:pt is calling and dr Martinique has increase her trazodone to two pills a day instead of 1 pill a day. Pt needs refill with changes in directions . [facility] [place]

## 2018-06-19 ENCOUNTER — Telehealth: Payer: Self-pay

## 2018-06-19 NOTE — Telephone Encounter (Signed)
Copied from [place] [id number]. Topic: Appointment Scheduling - Transfer of Care >> Jun 19, 2018  3:12 PM Janace Aris A wrote: Pt is requesting to transfer FROM: [facility] [place]  Pt is requesting to transfer TO: [facility] [place]  Reason for requested transfer: Closer commute to home.   Send CRM to patient's current PCP (transferring FROM).

## 2018-06-19 NOTE — Telephone Encounter (Signed)
It is fine with me. Thanks, BJ 

## 2018-06-19 NOTE — Telephone Encounter (Signed)
Message sent to Dr. Jordan for review and approval. 

## 2018-06-19 NOTE — Telephone Encounter (Signed)
Dr. Martinique - Please advise on pt's request to Transfer Care. Thanks!

## 2018-06-20 NOTE — Telephone Encounter (Signed)
We are fine with her transferring here -- I and Dr. Jonni Sanger are taking new patients so she can schedule with either of [place].

## 2018-06-20 NOTE — Telephone Encounter (Signed)
Pt requesting to transfer care to [facility] location.

## 2018-06-21 NOTE — Telephone Encounter (Signed)
Pt called to follow up on increase of her Trazadone. Stated because she was instructed to double up, she is now about to be out before her next refill. Please advise.   [id number] [place] [place] [phone] (Phone) [phone] (Fax)

## 2018-06-21 NOTE — Telephone Encounter (Signed)
See telephone encounter dated 06/01/18; will route to office for final disposition.

## 2018-06-22 ENCOUNTER — Other Ambulatory Visit: Payer: Self-pay | Admitting: Family Medicine

## 2018-06-22 MED ORDER — TRAZODONE HCL 100 MG PO TABS: 100.0000 mg | ORAL_TABLET | Freq: Every day | ORAL | 1 refills | Status: DC

## 2018-06-22 NOTE — Telephone Encounter (Signed)
LMOVM asking pt to CB to schedule a transfer of care appt with either Dr. Jonni Sanger or Elyn Aquas, [place].

## 2018-06-22 NOTE — Telephone Encounter (Signed)
Left detailed message with directions per Dr. Martinique.

## 2018-06-22 NOTE — Telephone Encounter (Signed)
[  I have reviewed records, last visit in 01/2018, she reported that she was sleeping better with trazodone 50 mg.  I cannot find encounter when she was instructed to double dose of trazodone.]  If she is taking trazodone 100 mg (2 tablets of 50 mg), I am sending 30-day supply of trazodone 100 mg, so she can continue 1 tablet daily at bedtime as needed.  She needs to schedule appointment with new PCP to follow on insomnia.  Thanks, BJ

## 2018-06-22 NOTE — Telephone Encounter (Signed)
Waiting on approval from Dr. Martinique. Please advise

## 2018-06-25 ENCOUNTER — Encounter: Payer: Self-pay | Admitting: Family Medicine

## 2018-06-26 ENCOUNTER — Encounter: Payer: 59 | Admitting: Family Medicine

## 2018-06-26 ENCOUNTER — Telehealth: Payer: Self-pay

## 2018-06-26 NOTE — Telephone Encounter (Signed)
Results received and patient aware of results and recommendations per Dr. Martinique. Spoke with patient on 06/25/18 concerning results. Called [facility] and informed them that results were received.

## 2018-06-26 NOTE — Telephone Encounter (Signed)
Copied from [place] [phone]. Topic: General - Inquiry >> Jun 26, 2018 11:51 AM Virl Axe D wrote: Reason for CRM: [facility] called to follow up and see if Dr. Martinique received abnormal Cologuard results for pt. Please advise if it was not received. Results were sent via Fax. CB# [phone]

## 2018-06-27 ENCOUNTER — Encounter: Payer: Self-pay | Admitting: Gastroenterology

## 2018-06-27 ENCOUNTER — Other Ambulatory Visit: Payer: Self-pay | Admitting: *Deleted

## 2018-06-27 DIAGNOSIS — Z1211 Encounter for screening for malignant neoplasm of colon: Secondary | ICD-10-CM

## 2018-06-27 NOTE — Telephone Encounter (Signed)
Called patient and informed her that her referral was sent to [facility] in [place] and that they would call her directly to schedule her appointment. Patient verbalized understanding.  Copied from [place] [id number]. Topic: Referral - Question >> Jun 27, 2018  8:11 AM Conception Chancy, NT wrote: Reason for CRM: patient is calling and states that she was told on Monday 06/25/18 that she needed to get a colonoscopy and a referral would be placed. She is calling to follow up on this.

## 2018-07-02 ENCOUNTER — Encounter: Payer: Self-pay | Admitting: Family Medicine

## 2018-07-02 ENCOUNTER — Other Ambulatory Visit: Payer: Self-pay | Admitting: Family Medicine

## 2018-07-02 DIAGNOSIS — G4700 Insomnia, unspecified: Secondary | ICD-10-CM

## 2018-07-06 ENCOUNTER — Encounter: Payer: Self-pay | Admitting: Family Medicine

## 2018-07-06 ENCOUNTER — Other Ambulatory Visit: Payer: Self-pay

## 2018-07-06 ENCOUNTER — Ambulatory Visit: Payer: 59 | Admitting: Family Medicine

## 2018-07-06 VITALS — BP 138/86 | HR 59 | Temp 97.9°F | Resp 16 | Ht 66.0 in | Wt 214.4 lb

## 2018-07-06 DIAGNOSIS — Z23 Encounter for immunization: Secondary | ICD-10-CM | POA: Diagnosis not present

## 2018-07-06 DIAGNOSIS — R195 Other fecal abnormalities: Secondary | ICD-10-CM | POA: Diagnosis not present

## 2018-07-06 DIAGNOSIS — Z85820 Personal history of malignant melanoma of skin: Secondary | ICD-10-CM

## 2018-07-06 DIAGNOSIS — K644 Residual hemorrhoidal skin tags: Secondary | ICD-10-CM

## 2018-07-06 DIAGNOSIS — I10 Essential (primary) hypertension: Secondary | ICD-10-CM

## 2018-07-06 DIAGNOSIS — F4321 Adjustment disorder with depressed mood: Secondary | ICD-10-CM | POA: Diagnosis not present

## 2018-07-06 DIAGNOSIS — F5102 Adjustment insomnia: Secondary | ICD-10-CM

## 2018-07-06 DIAGNOSIS — Z720 Tobacco use: Secondary | ICD-10-CM

## 2018-07-06 DIAGNOSIS — R232 Flushing: Secondary | ICD-10-CM

## 2018-07-06 DIAGNOSIS — K219 Gastro-esophageal reflux disease without esophagitis: Secondary | ICD-10-CM

## 2018-07-06 DIAGNOSIS — D72829 Elevated white blood cell count, unspecified: Secondary | ICD-10-CM

## 2018-07-06 DIAGNOSIS — R569 Unspecified convulsions: Secondary | ICD-10-CM | POA: Insufficient documentation

## 2018-07-06 HISTORY — DX: Elevated white blood cell count, unspecified: D72.829

## 2018-07-06 MED ORDER — TRAZODONE HCL 100 MG PO TABS: 100.0000 mg | ORAL_TABLET | Freq: Every day | ORAL | 3 refills | Status: DC

## 2018-07-06 MED ORDER — METOPROLOL SUCCINATE ER 100 MG PO TB24: 100.0000 mg | ORAL_TABLET | Freq: Every day | ORAL | 3 refills | Status: DC

## 2018-07-06 MED ORDER — HYDROCORTISONE ACETATE 30 MG RE SUPP: 1.0000 | Freq: Every day | RECTAL | 0 refills | Status: DC | PRN

## 2018-07-06 NOTE — Patient Instructions (Signed)
Please return in 2-3 monthsfor follow up of your hypertension.  We are changing your blood pressure medication to Toprol XL. Please stop the atenolol. Call if you have any problems.   Best of luck with your colonoscopy.  Stop smoking :)  Today you were given your Flu vaccination.   I have ordered a steroid cream for your hemorrhoids.   It was a pleasure meeting you today! Thank you for choosing [place] to meet your healthcare needs! I truly look forward to working with you. If you have any questions or concerns, please send me a message via Mychart or call the office at [phone].   Hemorrhoids Hemorrhoids are swollen veins in and around the rectum or anus. There are two types of hemorrhoids:  Internal hemorrhoids. These occur in the veins that are just inside the rectum. They may poke through to the outside and become irritated and painful.  External hemorrhoids. These occur in the veins that are outside the anus and can be felt as a painful swelling or hard lump near the anus. Most hemorrhoids do not cause serious problems, and they can be managed with home treatments such as diet and lifestyle changes. If home treatments do not help the symptoms, procedures can be done to shrink or remove the hemorrhoids. What are the causes? This condition is caused by increased pressure in the anal area. This pressure may result from various things, including:  Constipation.  Straining to have a bowel movement.  Diarrhea.  Pregnancy.  Obesity.  Sitting for long periods of time.  Heavy lifting or other activity that causes you to strain.  Anal sex.  Riding a bike for a long period of time. What are the signs or symptoms? Symptoms of this condition include:  Pain.  Anal itching or irritation.  Rectal bleeding.  Leakage of stool (feces).  Anal swelling.  One or more lumps around the anus. How is this diagnosed? This condition can often be diagnosed through a visual exam. Other  exams or tests may also be done, such as:  An exam that involves feeling the rectal area with a gloved hand (digital rectal exam).  An exam of the anal canal that is done using a small tube (anoscope).  A blood test, if you have lost a significant amount of blood.  A test to look inside the colon using a flexible tube with a camera on the end (sigmoidoscopy or colonoscopy). How is this treated? This condition can usually be treated at home. However, various procedures may be done if dietary changes, lifestyle changes, and other home treatments do not help your symptoms. These procedures can help make the hemorrhoids smaller or remove them completely. Some of these procedures involve surgery, and others do not. Common procedures include:  Rubber band ligation. Rubber bands are placed at the base of the hemorrhoids to cut off their blood supply.  Sclerotherapy. Medicine is injected into the hemorrhoids to shrink them.  Infrared coagulation. A type of light energy is used to get rid of the hemorrhoids.  Hemorrhoidectomy surgery. The hemorrhoids are surgically removed, and the veins that supply them are tied off.  Stapled hemorrhoidopexy surgery. The surgeon staples the base of the hemorrhoid to the rectal wall. Follow these instructions at home: Eating and drinking   Eat foods that have a lot of fiber in them, such as whole grains, beans, nuts, fruits, and vegetables.  Ask your health care provider about taking products that have added fiber (fiber supplements).  Reduce  the amount of fat in your diet. You can do this by eating low-fat dairy products, eating less red meat, and avoiding processed foods.  Drink enough fluid to keep your urine pale yellow. Managing pain and swelling   Take warm sitz baths for 20 minutes, 3-4 times a day to ease pain and discomfort. You may do this in a bathtub or using a portable sitz bath that fits over the toilet.  If directed, apply ice to the  affected area. Using ice packs between sitz baths may be helpful. ? Put ice in a plastic bag. ? Place a towel between your skin and the bag. ? Leave the ice on for 20 minutes, 2-3 times a day. General instructions  Take over-the-counter and prescription medicines only as told by your health care provider.  Use medicated creams or suppositories as told.  Get regular exercise. Ask your health care provider how much and what kind of exercise is best for you. In general, you should do moderate exercise for at least 30 minutes on most days of the week (150 minutes each week). This can include activities such as walking, biking, or yoga.  Go to the bathroom when you have the urge to have a bowel movement. Do not wait.  Avoid straining to have bowel movements.  Keep the anal area dry and clean. Use wet toilet paper or moist towelettes after a bowel movement.  Do not sit on the toilet for long periods of time. This increases blood pooling and pain.  Keep all follow-up visits as told by your health care provider. This is important. Contact a health care provider if you have:  Increasing pain and swelling that are not controlled by treatment or medicine.  Difficulty having a bowel movement, or you are unable to have a bowel movement.  Pain or inflammation outside the area of the hemorrhoids. Get help right away if you have:  Uncontrolled bleeding from your rectum. Summary  Hemorrhoids are swollen veins in and around the rectum or anus.  Most hemorrhoids can be managed with home treatments such as diet and lifestyle changes.  Taking warm sitz baths can help ease pain and discomfort.  In severe cases, procedures or surgery can be done to shrink or remove the hemorrhoids. This information is not intended to replace advice given to you by your health care provider. Make sure you discuss any questions you have with your health care provider. Document Released: 06/24/2000 Document Revised:  11/16/2017 Document Reviewed: 11/16/2017 Elsevier Interactive Patient Education  2019 [employer].

## 2018-07-06 NOTE — Progress Notes (Signed)
Subjective  CC:  Chief Complaint  Patient presents with  . Establish Care    Recently had a positive colorguard test, colonoscopy scheduled..   . Hypertension    Home BPs have mid 130s/80s  . Gastroesophageal Reflux  . Insomnia    Trazadone helps with sleeping, recently increased to 100mg .    HPI: Sarah Palmer is a 62 y.o. female who presents to [place] at [facility] today to establish care with me as a new patient.  I have reviewed multiple old records from [place], Dr. [place]ique and Dr. Cordelia Pen office.  She has the following concerns or needs:  Very pleasant recently widowed female presents with recent positive cologuard. Has been referred to [facility] for colonoscopy and has appt in early January. Is appropriately anxious; her husband passed from stage 4 colon cancer in November. She denies weight loss, melena, loss of appetite. C/o hemorrhoids and occ firm stools. Has chronic GERD on PPIs. Reports good control with meds. She did start smoking again due to the anxiety of her husband's death. Wants to quit again. Denies h/o copd or cough, sob or wheezing.   Essential HTN: Feeling well. Taking medications w/o adverse effects. No symptoms of CHF, angina; no palpitations, sob, cp or lower extremity edema. Compliant with meds. On atenolol for years. Home readings are good to borderline. No thiazide diuretics due to h/o hyponatremia and seizure.   Insomnia, adjustment: doing better since trazadone was increased to 100mg  nightly.   H/o leukocytosis with neg heme eval; runs 14-15s  H/o flushing with negative initial eval for pheo  HM: last cpe in July; I reviewed labs. Mammogram overdue; pt to schedule in march. and pap reported in 2017 from [place]. Due flu vaccine.   Assessment  1. Positive colorectal cancer screening using Cologuard test   2. Essential hypertension   3. Tobacco abuse   4. Grief reaction   5. External hemorrhoids   6. Gastroesophageal reflux  disease, esophagitis presence not specified   7. Adjustment insomnia   8. Need for immunization against influenza   9. Leukocytosis, unspecified type   10. Flushing   11. History of melanoma      Plan   Discussed colon cancer screening; has appt. Reassured.   Grief and insomnia: continue trazadone. Appropriate  Hemorrhoids: steroid supp ordered  HTN is fairly well controlled; change to toprol xl and recheck in 3 months.   rec smoking cessation. Counseling done  Updated flu vaccine today. Pt to schedule mammogram. She defers until march to stay on track.   H/o melanoma: will need a dermatologist for annual surveillance.   Follow up:  Return in about 3 months (around 10/05/2018) for follow up Hypertension. Orders Placed This Encounter  Procedures  . Flu Vaccine QUAD 36+ mos IM  . Ambulatory referral to Dermatology   Meds ordered this encounter  Medications  . metoprolol succinate (TOPROL-XL) 100 MG 24 hr tablet    Sig: Take 1 tablet (100 mg total) by mouth daily. Take with or immediately following a meal.    Dispense:  90 tablet    Refill:  3  . traZODone (DESYREL) 100 MG tablet    Sig: Take 1 tablet (100 mg total) by mouth at bedtime.    Dispense:  90 tablet    Refill:  3    Dose changed from 50 mg to 100 mg.  . HYDROCORTISONE ACE, RECTAL, (PROCTOCORT) 30 MG SUPP    Sig: Place 1 suppository (30  mg total) rectally daily as needed.    Dispense:  28 each    Refill:  0    Can substitute any covered steroid supp or cream.     No flowsheet data found.  We updated and reviewed the patient's past history in detail and it is documented below.  Patient Active Problem List   Diagnosis Date Noted  . Insomnia 08/12/2016    Priority: High  . Essential hypertension     Priority: High  . Melanoma ([place])     Priority: High    resected from lower extremity   . Tobacco abuse     Priority: High    Quit attempt initiated in 06/2012.   Marland Kitchen Leukocytosis 07/06/2018    Priority:  Medium    Negative eval by hematology 2018   . Class 2 obesity with body mass index (BMI) of 37.0 to 37.9 in adult 01/10/2017    Priority: Medium  . Flushing 09/01/2016    Priority: Medium    Evaluated by Dr. Loanne Drilling 2018 for pheochromocytoma; urine and initial lab tests were normal. To repeat testing if symptoms recur.    . Gastroesophageal reflux disease 08/13/2016    Priority: Medium  . History of alcohol abuse     Priority: Medium    Patient reports being in recovery for years and associated with a formal treatment program.   . Chest pain     Priority: Low    +Palpitations; negative chest CT in 2009; associated with URI in 06/2012; CT reviewed with radiologist-minimal, if any, coronary/aortic calcification.    Health Maintenance  Topic Date Due  . MAMMOGRAM  09/09/2017  . HIV Screening  01/20/2019 (Originally 12/16/1970)  . PAP SMEAR-Modifier  06/11/2019  . Fecal DNA (Cologuard)  06/16/2021  . TETANUS/TDAP  07/11/2025  . INFLUENZA VACCINE  Completed  . Hepatitis C Screening  Completed   Immunization History  Administered Date(s) Administered  . Influenza,inj,Quad PF,6+ Mos 07/06/2018   Current Meds  Medication Sig  . amLODipine (NORVASC) 5 MG tablet Take 1 tablet (5 mg total) by mouth daily.  Marland Kitchen olmesartan (BENICAR) 40 MG tablet Take 40 mg by mouth daily.  . pantoprazole (PROTONIX) 40 MG tablet TAKE 1 TABLET (40 MG TOTAL) BY MOUTH DAILY AS NEEDED.  Marland Kitchen traZODone (DESYREL) 100 MG tablet Take 1 tablet (100 mg total) by mouth at bedtime.  . [DISCONTINUED] atenolol (TENORMIN) 100 MG tablet Take 1 tablet (100 mg total) by mouth daily.  . [DISCONTINUED] traZODone (DESYREL) 100 MG tablet Take 1 tablet (100 mg total) by mouth at bedtime.    Allergies: Patient is allergic to penicillins and thiazide-type diuretics. Past Medical History Patient  has a past medical history of Acid reflux, Anxiety, Chest pain (2009), History of alcohol abuse (2009), History of seizure - electrolyte  abnormality (09/26/2013), Hypertension, Insomnia, Leukocytosis (07/06/2018), Melanoma ([place]) (1999), and Tobacco abuse. Past Surgical History Patient  has a past surgical history that includes Melanoma excision (1999) and Middle ear surgery (2017). Family History: Patient family history includes COPD in her father; Healthy in her mother, sister, and sister; Hypertension in an other family member; Obesity in her sister. Social History:  Patient  reports that she has been smoking cigarettes. She has a 4.00 pack-year smoking history. She quit smokeless tobacco use about 6 years ago. She reports that she does not drink alcohol or use drugs.  Review of Systems: Constitutional: negative for fever or malaise Ophthalmic: negative for photophobia, double vision or loss of vision Cardiovascular:  negative for chest pain, dyspnea on exertion, or new LE swelling Respiratory: negative for SOB or persistent cough Gastrointestinal: negative for abdominal pain, change in bowel habits or melena Genitourinary: negative for dysuria or gross hematuria Musculoskeletal: negative for new gait disturbance or muscular weakness Integumentary: negative for new or persistent rashes Neurological: negative for TIA or stroke symptoms Psychiatric: negative for SI or delusions Allergic/Immunologic: negative for hives  Patient Care Team    Relationship Specialty Notifications Start End  Leamon Arnt, MD PCP - General Family Medicine  07/06/18   Yehuda Savannah, MD Attending Physician Cardiology  07/06/12     Objective  Vitals: BP 138/86 (BP Location: Left Arm)   Pulse (!) 59   Temp 97.9 F (36.6 C) (Oral)   Resp 16   Ht 5\' 6"  (1.676 m)   Wt 214 lb 6.4 oz (97.3 kg)   LMP 07/06/2008 (Approximate)   SpO2 97%   BMI 34.61 kg/m  General:  Well developed, well nourished, no acute distress  Psych:  Alert and oriented,normal mood and affect HEENT:  Normocephalic, atraumatic, non-icteric sclera, PERRL, oropharynx is  without mass or exudate, supple neck without adenopathy, mass or thyromegaly Cardiovascular:  RRR without gallop, rub or murmur, nondisplaced PMI Respiratory:  Good breath sounds bilaterally, CTAB with normal respiratory effort Gastrointestinal: normal bowel sounds, soft, non-tender, no noted masses. No HSM MSK: no deformities, contusions. Joints are without erythema or swelling Skin:  Warm, no rashes or suspicious lesions noted Neurologic:    Mental status is normal. Gross motor and sensory exams are normal. Normal gait   Commons side effects, risks, benefits, and alternatives for medications and treatment plan prescribed today were discussed, and the patient expressed understanding of the given instructions. Patient is instructed to call or message via MyChart if he/she has any questions or concerns regarding our treatment plan. No barriers to understanding were identified. We discussed Red Flag symptoms and signs in detail. Patient expressed understanding regarding what to do in case of urgent or emergency type symptoms.   Medication list was reconciled, printed and provided to the patient in AVS. Patient instructions and summary information was reviewed with the patient as documented in the AVS. This note was prepared with assistance of Dragon voice recognition software. Occasional wrong-word or sound-a-like substitutions may have occurred due to the inherent limitations of voice recognition software

## 2018-07-10 ENCOUNTER — Other Ambulatory Visit: Payer: Self-pay | Admitting: Family Medicine

## 2018-07-10 DIAGNOSIS — G4700 Insomnia, unspecified: Secondary | ICD-10-CM

## 2018-07-18 ENCOUNTER — Ambulatory Visit (AMBULATORY_SURGERY_CENTER): Payer: Self-pay | Admitting: *Deleted

## 2018-07-18 ENCOUNTER — Other Ambulatory Visit: Payer: Self-pay

## 2018-07-18 VITALS — Ht 66.0 in | Wt 215.8 lb

## 2018-07-18 DIAGNOSIS — R195 Other fecal abnormalities: Secondary | ICD-10-CM

## 2018-07-18 MED ORDER — SUPREP BOWEL PREP KIT 17.5-3.13-1.6 GM/177ML PO SOLN: 1.0000 | Freq: Once | ORAL | 0 refills | Status: AC

## 2018-07-18 NOTE — Progress Notes (Signed)
No egg or soy allergy known to patient  No issues with past sedation with any surgeries  or procedures, no intubation problems  No diet pills per patient No home 02 use per patient  No blood thinners per patient  Pt denies issues with constipation  No A fib or A flutter  EMMI video offered and declined by the patient. 

## 2018-07-19 ENCOUNTER — Encounter: Payer: Self-pay | Admitting: Gastroenterology

## 2018-07-25 ENCOUNTER — Encounter: Payer: Self-pay | Admitting: Gastroenterology

## 2018-07-25 ENCOUNTER — Ambulatory Visit (AMBULATORY_SURGERY_CENTER): Payer: 59 | Admitting: Gastroenterology

## 2018-07-25 VITALS — BP 140/68 | HR 53 | Temp 97.1°F | Resp 19 | Wt 215.0 lb

## 2018-07-25 DIAGNOSIS — Z1211 Encounter for screening for malignant neoplasm of colon: Secondary | ICD-10-CM | POA: Diagnosis not present

## 2018-07-25 DIAGNOSIS — K649 Unspecified hemorrhoids: Secondary | ICD-10-CM | POA: Diagnosis not present

## 2018-07-25 DIAGNOSIS — R195 Other fecal abnormalities: Secondary | ICD-10-CM

## 2018-07-25 DIAGNOSIS — K5730 Diverticulosis of large intestine without perforation or abscess without bleeding: Secondary | ICD-10-CM

## 2018-07-25 MED ORDER — SODIUM CHLORIDE 0.9 % IV SOLN: 500.0000 mL | Freq: Once | INTRAVENOUS | Status: DC

## 2018-07-25 NOTE — Patient Instructions (Signed)
Continue present medications. Please read handouts provided.     YOU HAD AN ENDOSCOPIC PROCEDURE TODAY AT [facility]:   Refer to the procedure report that was given to you for any specific questions about what was found during the examination.  If the procedure report does not answer your questions, please call your gastroenterologist to clarify.  If you requested that your care partner not be given the details of your procedure findings, then the procedure report has been included in a sealed envelope for you to review at your convenience later.  YOU SHOULD EXPECT: Some feelings of bloating in the abdomen. Passage of more gas than usual.  Walking can help get rid of the air that was put into your GI tract during the procedure and reduce the bloating. If you had a lower endoscopy (such as a colonoscopy or flexible sigmoidoscopy) you may notice spotting of blood in your stool or on the toilet paper. If you underwent a bowel prep for your procedure, you may not have a normal bowel movement for a few days.  Please Note:  You might notice some irritation and congestion in your nose or some drainage.  This is from the oxygen used during your procedure.  There is no need for concern and it should clear up in a day or so.  SYMPTOMS TO REPORT IMMEDIATELY:   Following lower endoscopy (colonoscopy or flexible sigmoidoscopy):  Excessive amounts of blood in the stool  Significant tenderness or worsening of abdominal pains  Swelling of the abdomen that is new, acute  Fever of 100F or higher    For urgent or emergent issues, a gastroenterologist can be reached at any hour by calling [phone].   DIET:  We do recommend a small meal at first, but then you may proceed to your regular diet.  Drink plenty of fluids but you should avoid alcoholic beverages for 24 hours.  ACTIVITY:  You should plan to take it easy for the rest of today and you should NOT DRIVE or use heavy machinery  until tomorrow (because of the sedation medicines used during the test).    FOLLOW UP: Our staff will call the number listed on your records the next business day following your procedure to check on you and address any questions or concerns that you may have regarding the information given to you following your procedure. If we do not reach you, we will leave a message.  However, if you are feeling well and you are not experiencing any problems, there is no need to return our call.  We will assume that you have returned to your regular daily activities without incident.  If any biopsies were taken you will be contacted by phone or by letter within the next 1-3 weeks.  Please call us at [phone] if you have not heard about the biopsies in 3 weeks.    SIGNATURES/CONFIDENTIALITY: You and/or your care partner have signed paperwork which will be entered into your electronic medical record.  These signatures attest to the fact that that the information above on your After Visit Summary has been reviewed and is understood.  Full responsibility of the confidentiality of this discharge information lies with you and/or your care-partner.

## 2018-07-25 NOTE — Progress Notes (Signed)
To PACU, VSS. Report to [email or web address] 

## 2018-07-25 NOTE — Op Note (Signed)
[place] Patient Name: Sarah Palmer Procedure Date: 07/25/2018 9:32 AM MRN: [id number] Endoscopist: Mauri Pole , MD Age: 63 Referring MD:  Date of Birth: 09-16-1955 Gender: Female Account #: [id number] Procedure:                Colonoscopy Indications:              Positive Cologuard test Medicines:                Monitored Anesthesia Care Procedure:                Pre-Anesthesia Assessment:                           - Prior to the procedure, a History and Physical                            was performed, and patient medications and                            allergies were reviewed. The patient's tolerance of                            previous anesthesia was also reviewed. The risks                            and benefits of the procedure and the sedation                            options and risks were discussed with the patient.                            All questions were answered, and informed consent                            was obtained. Prior Anticoagulants: The patient has                            taken no previous anticoagulant or antiplatelet                            agents. ASA Grade Assessment: II - A patient with                            mild systemic disease. After reviewing the risks                            and benefits, the patient was deemed in                            satisfactory condition to undergo the procedure.                           After obtaining informed consent, the colonoscope  was passed under direct vision. Throughout the                            procedure, the patient's blood pressure, pulse, and                            oxygen saturations were monitored continuously. The                            Colonoscope was introduced through the anus and                            advanced to the the cecum, identified by                            appendiceal orifice and ileocecal valve.  The                            colonoscopy was performed without difficulty. The                            patient tolerated the procedure well. The quality                            of the bowel preparation was excellent. The                            ileocecal valve, appendiceal orifice, and rectum                            were photographed. Scope In: 9:36:10 AM Scope Out: 9:52:27 AM Scope Withdrawal Time: 0 hours 12 minutes 0 seconds  Total Procedure Duration: 0 hours 16 minutes 17 seconds  Findings:                 The perianal and digital rectal examinations were                            normal.                           Scattered small-mouthed diverticula were found in                            the sigmoid colon and descending colon.                           Non-bleeding internal hemorrhoids were found during                            retroflexion. The hemorrhoids were medium-sized. Complications:            No immediate complications. Estimated Blood Loss:     Estimated blood loss was minimal. Impression:               - Moderate diverticulosis in the sigmoid colon and  in the descending colon.                           - Non-bleeding internal hemorrhoids.                           - No specimens collected. Recommendation:           - Patient has a contact number available for                            emergencies. The signs and symptoms of potential                            delayed complications were discussed with the                            patient. Return to normal activities tomorrow.                            Written discharge instructions were provided to the                            patient.                           - Resume previous diet.                           - Continue present medications.                           - Repeat colonoscopy in 10 years for screening                            purposes.                            - Return to my office as needed for hemorrhoidal                            band ligation. Mauri Pole, MD 07/25/2018 9:59:57 AM This report has been signed electronically.

## 2018-07-26 ENCOUNTER — Telehealth: Payer: Self-pay

## 2018-07-26 NOTE — Telephone Encounter (Signed)
  Follow up Call-  Call back number 07/25/2018  Post procedure Call Back phone  # [phone]  Permission to leave phone message Yes  Some recent data might be hidden     Patient questions:  Do you have a fever, pain , or abdominal swelling? No. Pain Score  0 *  Have you tolerated food without any problems? Yes.    Have you been able to return to your normal activities? Yes.    Do you have any questions about your discharge instructions: Diet   No. Medications  No. Follow up visit  No.  Do you have questions or concerns about your Care? No.  Actions: * If pain score is 4 or above: No action needed, pain <4.

## 2018-07-30 ENCOUNTER — Other Ambulatory Visit: Payer: Self-pay | Admitting: Family Medicine

## 2018-07-30 DIAGNOSIS — K219 Gastro-esophageal reflux disease without esophagitis: Secondary | ICD-10-CM

## 2018-07-30 MED ORDER — PANTOPRAZOLE SODIUM 40 MG PO TBEC: 40.0000 mg | DELAYED_RELEASE_TABLET | Freq: Every day | ORAL | 0 refills | Status: DC | PRN

## 2018-07-30 NOTE — Telephone Encounter (Signed)
Requested Prescriptions  Pending Prescriptions Disp Refills  . pantoprazole (PROTONIX) 40 MG tablet 90 tablet 0    Sig: Take 1 tablet (40 mg total) by mouth daily as needed.     Gastroenterology: Proton Pump Inhibitors Passed - 07/30/2018  3:54 PM      Passed - Valid encounter within last 12 months    Recent Outpatient Visits          3 weeks ago Positive colorectal cancer screening using Cologuard test   [facility], MD   1 month ago Adjustment insomnia   [place] [facility], Rayford Halsted, MD   6 months ago Colon cancer IT trainer [facility], Malka So, MD   1 year ago Insomnia, unspecified type   Therapist, [facility] at [facility], Malka So, MD   1 year ago Insomnia, unspecified type   Therapist, music at [facility], Malka So, MD

## 2018-07-30 NOTE — Telephone Encounter (Signed)
Copied from [place]. Topic: Quick Communication - Rx Refill/Question >> Jul 30, 2018  3:49 PM Leward Quan A wrote: Medication: pantoprazole (PROTONIX) 40 MG tablet   Has the patient contacted their pharmacy? Yes.   (Agent: If no, request that the patient contact the pharmacy for the refill.) (Agent: If yes, when and what did the pharmacy advise?)  Preferred Pharmacy (with phone number or street name): [id number] [place], [place] [phone] (Phone) [phone] (Fax)    Agent: Please be advised that RX refills may take up to 3 business days. We ask that you follow-up with your pharmacy.

## 2018-08-01 ENCOUNTER — Other Ambulatory Visit: Payer: Self-pay | Admitting: Family Medicine

## 2018-08-23 DIAGNOSIS — Z08 Encounter for follow-up examination after completed treatment for malignant neoplasm: Secondary | ICD-10-CM | POA: Diagnosis not present

## 2018-08-23 DIAGNOSIS — Z85820 Personal history of malignant melanoma of skin: Secondary | ICD-10-CM | POA: Diagnosis not present

## 2018-08-23 DIAGNOSIS — Z1283 Encounter for screening for malignant neoplasm of skin: Secondary | ICD-10-CM | POA: Diagnosis not present

## 2018-09-11 ENCOUNTER — Other Ambulatory Visit: Payer: Self-pay | Admitting: Family Medicine

## 2018-09-11 DIAGNOSIS — I10 Essential (primary) hypertension: Secondary | ICD-10-CM

## 2018-10-04 ENCOUNTER — Other Ambulatory Visit: Payer: Self-pay | Admitting: Family Medicine

## 2018-10-04 NOTE — Telephone Encounter (Signed)
Will need f/u HTN due once restrictions due to covid-19 pandemic released. cpe due in July 2020.

## 2018-10-23 ENCOUNTER — Other Ambulatory Visit: Payer: Self-pay | Admitting: Family Medicine

## 2018-10-23 DIAGNOSIS — K219 Gastro-esophageal reflux disease without esophagitis: Secondary | ICD-10-CM

## 2018-11-29 DIAGNOSIS — Z1231 Encounter for screening mammogram for malignant neoplasm of breast: Secondary | ICD-10-CM | POA: Diagnosis not present

## 2018-12-12 ENCOUNTER — Ambulatory Visit (INDEPENDENT_AMBULATORY_CARE_PROVIDER_SITE_OTHER): Payer: BC Managed Care – PPO | Admitting: Family Medicine

## 2018-12-12 ENCOUNTER — Encounter: Payer: Self-pay | Admitting: Family Medicine

## 2018-12-12 DIAGNOSIS — R29898 Other symptoms and signs involving the musculoskeletal system: Secondary | ICD-10-CM

## 2018-12-12 DIAGNOSIS — Z1211 Encounter for screening for malignant neoplasm of colon: Secondary | ICD-10-CM | POA: Insufficient documentation

## 2018-12-12 DIAGNOSIS — Z1212 Encounter for screening for malignant neoplasm of rectum: Secondary | ICD-10-CM | POA: Insufficient documentation

## 2018-12-12 NOTE — Patient Instructions (Signed)
Follow-up as scheduled for complete physical and hypertension follow-up 12/31/2018

## 2018-12-12 NOTE — Progress Notes (Signed)
Virtual Visit via Video Note  Subjective  CC:  Chief Complaint  Patient presents with  . rt knee swelling     I connected with Sarah Palmer on 12/12/18 at  2:40 PM EDT by a video enabled telemedicine application and verified that I am speaking with the correct person using two identifiers. Location patient: Home Location provider: [facility] [place], Office Persons participating in the virtual visit: Shirly Bartosiewicz, Leamon Arnt, MD Zena Amos, RN  I discussed the limitations of evaluation and management by telemedicine and the availability of in person appointments. The patient expressed understanding and agreed to proceed. HPI: Sarah Palmer is a 63 y.o. female who was contacted today to address the problems listed above in the chief complaint. . 63 year old female who was was getting out of her shower about 4-5 days ago and misstepped causing her knee to hyperextend.  She has minimal swelling initially.  That is resolved.  However, she reports intermittent popping.  She denies pain, swelling, locking, or give way symptoms.  She says it only happens if she is wearing shoes, not if she is barefoot.  No chronic knee issues although she does report twisting her knee about 2 years ago. Marland Kitchen Health maintenance and chronic condition follow-up: Patient is overdue.  She has scheduled a visit. Assessment  1. Popping sound of knee joint      Plan   New problem: Possible mild hyperextension injury or tendinitis.  Recommend rest and ice.  She may use a support if she feels that it is needed.  Recheck in 2 to 3 weeks if worsening or develops pain.  Appointment for complete physical and hypertension follow-up  Did review her colonoscopy that was normal. I discussed the assessment and treatment plan with the patient. The patient was provided an opportunity to ask questions and all were answered. The patient agreed with the plan and demonstrated an understanding of the  instructions.   The patient was advised to call back or seek an in-person evaluation if the symptoms worsen or if the condition fails to improve as anticipated. Follow up: No follow-ups on file.  12/31/2018  No orders of the defined types were placed in this encounter.     I reviewed the patients updated PMH, FH, and SocHx.    Patient Active Problem List   Diagnosis Date Noted  . Insomnia 08/12/2016    Priority: High  . Essential hypertension     Priority: High  . Melanoma ([place])     Priority: High  . Tobacco abuse     Priority: High  . Leukocytosis 07/06/2018    Priority: Medium  . Class 2 obesity with body mass index (BMI) of 37.0 to 37.9 in adult 01/10/2017    Priority: Medium  . Flushing 09/01/2016    Priority: Medium  . Gastroesophageal reflux disease 08/13/2016    Priority: Medium  . History of alcohol abuse     Priority: Medium  . Chest pain     Priority: Low  . Screening for colorectal cancer 12/12/2018   Current Meds  Medication Sig  . amLODipine (NORVASC) 5 MG tablet TAKE 1 TABLET BY MOUTH EVERY DAY  . Flaxseed Oil OIL by Does not apply route.  . metoprolol succinate (TOPROL-XL) 100 MG 24 hr tablet Take 1 tablet (100 mg total) by mouth daily. Take with or immediately following a meal.  . olmesartan (BENICAR) 40 MG tablet TAKE 1 TABLET BY MOUTH EVERY DAY  .  pantoprazole (PROTONIX) 40 MG tablet TAKE 1 TABLET BY MOUTH DAILY AS NEEDED  . traZODone (DESYREL) 100 MG tablet TAKE 1 TABLET BY MOUTH EVERYDAY AT BEDTIME    Allergies: Patient is allergic to penicillins and thiazide-type diuretics. Family History: Patient family history includes COPD in her father; Colon polyps in her mother; Healthy in her mother, sister, and sister; Hypertension in an other family member; Obesity in her sister. Social History:  Patient  reports that she has been smoking cigarettes. She has a 10.00 pack-year smoking history. She has never used smokeless tobacco. She reports that she  does not drink alcohol or use drugs.  Review of Systems: Constitutional: Negative for fever malaise or anorexia Cardiovascular: negative for chest pain Respiratory: negative for SOB or persistent cough Gastrointestinal: negative for abdominal pain  OBJECTIVE Vitals: LMP 07/06/2008 (Approximate)  General: no acute distress , A&Ox3   Interactive audio and video telecommunications were attempted between this provider and patient, however failed, due to patient having technical difficulties OR patient did not have access to video capability.  We continued and completed visit with audio only.    Leamon Arnt, MD

## 2018-12-16 ENCOUNTER — Other Ambulatory Visit: Payer: Self-pay | Admitting: Family Medicine

## 2018-12-16 DIAGNOSIS — K219 Gastro-esophageal reflux disease without esophagitis: Secondary | ICD-10-CM

## 2018-12-31 ENCOUNTER — Encounter: Payer: BC Managed Care – PPO | Admitting: Family Medicine

## 2019-01-13 ENCOUNTER — Other Ambulatory Visit: Payer: Self-pay | Admitting: Family Medicine

## 2019-01-13 DIAGNOSIS — K219 Gastro-esophageal reflux disease without esophagitis: Secondary | ICD-10-CM

## 2019-01-23 ENCOUNTER — Other Ambulatory Visit (HOSPITAL_COMMUNITY)
Admission: RE | Admit: 2019-01-23 | Discharge: 2019-01-23 | Disposition: A | Discharge status: Home / Self Care | Payer: BC Managed Care – PPO | Source: Ambulatory Visit | Attending: Family Medicine | Admitting: Family Medicine

## 2019-01-23 ENCOUNTER — Encounter: Payer: Self-pay | Admitting: Family Medicine

## 2019-01-23 ENCOUNTER — Other Ambulatory Visit: Payer: Self-pay

## 2019-01-23 ENCOUNTER — Ambulatory Visit (INDEPENDENT_AMBULATORY_CARE_PROVIDER_SITE_OTHER): Payer: BC Managed Care – PPO | Admitting: Family Medicine

## 2019-01-23 VITALS — BP 128/76 | HR 58 | Temp 98.2°F | Resp 16 | Ht 66.0 in | Wt 223.4 lb

## 2019-01-23 DIAGNOSIS — G4700 Insomnia, unspecified: Secondary | ICD-10-CM | POA: Diagnosis not present

## 2019-01-23 DIAGNOSIS — I10 Essential (primary) hypertension: Secondary | ICD-10-CM

## 2019-01-23 DIAGNOSIS — M21611 Bunion of right foot: Secondary | ICD-10-CM

## 2019-01-23 DIAGNOSIS — Z124 Encounter for screening for malignant neoplasm of cervix: Secondary | ICD-10-CM | POA: Diagnosis not present

## 2019-01-23 DIAGNOSIS — Z0000 Encounter for general adult medical examination without abnormal findings: Secondary | ICD-10-CM | POA: Diagnosis not present

## 2019-01-23 LAB — COMPREHENSIVE METABOLIC PANEL
ALT: 11 U/L (ref 0–35)
AST: 13 U/L (ref 0–37)
Albumin: 4.2 g/dL (ref 3.5–5.2)
Alkaline Phosphatase: 83 U/L (ref 39–117)
BUN: 14 mg/dL (ref 6–23)
CO2: 26 mEq/L (ref 19–32)
Calcium: 8.7 mg/dL (ref 8.4–10.5)
Chloride: 103 mEq/L (ref 96–112)
Creatinine, Ser: 0.69 mg/dL (ref 0.40–1.20)
GFR: 86.18 mL/min (ref >60.00)
Glucose, Bld: 89 mg/dL (ref 70–99)
Potassium: 4.1 mEq/L (ref 3.5–5.1)
Sodium: 137 mEq/L (ref 135–145)
Total Bilirubin: 0.4 mg/dL (ref 0.2–1.2)
Total Protein: 6.3 g/dL (ref 6.0–8.3)

## 2019-01-23 LAB — CBC WITH DIFFERENTIAL/PLATELET
Basophils Absolute: 0.1 10*3/uL (ref 0.0–0.1)
Basophils Relative: 0.5 % (ref 0.0–3.0)
Eosinophils Absolute: 0.3 10*3/uL (ref 0.0–0.7)
Eosinophils Relative: 2.4 % (ref 0.0–5.0)
HCT: 42 % (ref 36.0–46.0)
Hemoglobin: 14 g/dL (ref 12.0–15.0)
Lymphocytes Relative: 31.1 % (ref 12.0–46.0)
Lymphs Abs: 3.5 10*3/uL (ref 0.7–4.0)
MCHC: 33.3 g/dL (ref 30.0–36.0)
MCV: 85.9 fl (ref 78.0–100.0)
Monocytes Absolute: 1 10*3/uL (ref 0.1–1.0)
Monocytes Relative: 8.5 % (ref 3.0–12.0)
Neutro Abs: 6.6 10*3/uL (ref 1.4–7.7)
Neutrophils Relative %: 57.5 % (ref 43.0–77.0)
Platelets: 258 10*3/uL (ref 150.0–400.0)
RBC: 4.89 Mil/uL (ref 3.87–5.11)
RDW: 14.8 % (ref 11.5–15.5)
WBC: 11.4 10*3/uL — ABNORMAL HIGH (ref 4.0–10.5)

## 2019-01-23 LAB — LIPID PANEL
Cholesterol: 203 mg/dL — ABNORMAL HIGH (ref 0–200)
HDL: 56.4 mg/dL (ref >39.00)
NonHDL: 146.39
Total CHOL/HDL Ratio: 4
Triglycerides: 316 mg/dL — ABNORMAL HIGH (ref 0.0–149.0)
VLDL: 63.2 mg/dL — ABNORMAL HIGH (ref 0.0–40.0)

## 2019-01-23 LAB — LDL CHOLESTEROL, DIRECT: Direct LDL: 127 mg/dL

## 2019-01-23 MED ORDER — SHINGRIX 50 MCG/0.5ML IM SUSR: 0.5000 mL | Freq: Once | INTRAMUSCULAR | 0 refills | Status: AC

## 2019-01-23 NOTE — Progress Notes (Signed)
Subjective  Chief Complaint  Patient presents with  . Annual Exam    She is fasting.. Pap due in Dec, she is ok wiht having today  . Hypertension    Home BPs range 130s/mid 80s  . Gastroesophageal Reflux    Pantoprazole working well, takes it about every 3 days  . Insomnia    Takes Trazadone nightly, working well  . Bunions    Right foot, wants to know if she needs to be referred to Ortho since it is causing foot deformity    HPI: Sarah Palmer is a 63 y.o. female who presents to [facility] [place] today for a Female Wellness Visit. She also has the concerns and/or needs as listed above in the chief complaint. These will be addressed in addition to the Health Maintenance Visit.   Wellness Visit: annual visit with health maintenance review and exam with Pap   HM: mammo and CRC screen up to date. Due for pap. No concerns. Feels well. Keeping active and working. Still grieving the loss of husband who passed 05/2018 but appropriately. occ affects sleep.  Chronic disease f/u and/or acute problem visit: (deemed necessary to be done in addition to the wellness visit):  Chronic problems are well controlled on meds.   HTN f/u: Feeling well. Taking medications w/o adverse effects. No symptoms of CHF, angina; no palpitations, sob, cp or lower extremity edema. Compliant with meds.   Assessment  1. Annual physical exam   2. Cervical cancer screening   3. Essential hypertension   4. Insomnia, unspecified type   5. Bunion, right      Plan  Female Wellness Visit:  Age appropriate Health Maintenance and Prevention measures were discussed with patient. Included topics are cancer screening recommendations, ways to keep healthy (see AVS) including dietary and exercise recommendations, regular eye and dental care, use of seat belts, and avoidance of moderate alcohol use and tobacco use. Pap with hr hpv today.   BMI: discussed patient's BMI and encouraged positive lifestyle  modifications to help get to or maintain a target BMI.  HM needs and immunizations were addressed and ordered. See below for orders. See HM and immunization section for updates.shingrix RX given with education.   Routine labs and screening tests ordered including cmp, cbc and lipids where appropriate.  Discussed recommendations regarding Vit D and calcium supplementation (see AVS)  Chronic disease management visit and/or acute problem visit:  Chronic conditions well controlled. Continue current meds.  Follow up: Return in about 6 months (around 07/26/2019) for follow up Hypertension.  Orders Placed This Encounter  Procedures  . Comprehensive metabolic panel  . CBC with Differential/Platelet  . Lipid panel   Meds ordered this encounter  Medications  . Zoster Vaccine Adjuvanted [facility]) injection    Sig: Inject 0.5 mLs into the muscle once for 1 dose. Please give 2nd dose 2-6 months after first dose    Dispense:  2 each    Refill:  0      Lifestyle: Body mass index is 36.06 kg/m. Wt Readings from Last 3 Encounters:  01/23/19 223 lb 6.4 oz (101.3 kg)  07/25/18 215 lb (97.5 kg)  07/18/18 215 lb 12.8 oz (97.9 kg)     Patient Active Problem List   Diagnosis Date Noted  . Insomnia 08/12/2016    Priority: High  . Essential hypertension     Priority: High  . History of melanoma     Priority: High    resected from  lower extremity   . Leukocytosis 07/06/2018    Priority: Medium    Negative eval by hematology 2018   . Class 2 obesity with body mass index (BMI) of 37.0 to 37.9 in adult 01/10/2017    Priority: Medium  . Flushing 09/01/2016    Priority: Medium    Evaluated by Dr. Loanne Drilling 2018 for pheochromocytoma; urine and initial lab tests were normal. To repeat testing if symptoms recur.    . Gastroesophageal reflux disease 08/13/2016    Priority: Medium  . History of alcohol abuse     Priority: Medium    Patient reports being in recovery for years and associated  with a formal treatment program.   . Chest pain     Priority: Low    +Palpitations; negative chest CT in 2009; associated with URI in 06/2012; CT reviewed with radiologist-minimal, if any, coronary/aortic calcification.   . Screening for colorectal cancer 12/12/2018    + cologuard with normal f/u colonoscopy 07/2018. Repeat in 10 years for routine screening. No more cologuards since 1st was abnormal    Health Maintenance  Topic Date Due  . INFLUENZA VACCINE  02/09/2019  . PAP SMEAR-Modifier  06/11/2019  . MAMMOGRAM  10/10/2019  . TETANUS/TDAP  07/11/2025  . COLONOSCOPY  07/25/2028  . Hepatitis C Screening  Completed  . HIV Screening  Discontinued   Immunization History  Administered Date(s) Administered  . Influenza,inj,Quad PF,6+ Mos 07/06/2018   We updated and reviewed the patient's past history in detail and it is documented below. Allergies: Patient is allergic to penicillins and thiazide-type diuretics. Past Medical History Patient  has a past medical history of Acid reflux, Anxiety, Chest pain (2009), History of alcohol abuse (2009), History of seizure - electrolyte abnormality (09/26/2013), Hyperlipidemia, Hypertension, Insomnia, Leukocytosis (07/06/2018), Melanoma ([place]) (1999), and Tobacco abuse. Past Surgical History Patient  has a past surgical history that includes Melanoma excision (1999) and Middle ear surgery (2017). Family History: Patient family history includes COPD in her father; Colon polyps in her mother; Healthy in her mother, sister, and sister; Hypertension in an other family member; Obesity in her sister. Social History:  Patient  reports that she quit smoking about 5 years ago. Her smoking use included cigarettes. She has a 10.00 pack-year smoking history. She has never used smokeless tobacco. She reports that she does not drink alcohol or use drugs.  Review of Systems: Constitutional: negative for fever or malaise Ophthalmic: negative for photophobia,  double vision or loss of vision Cardiovascular: negative for chest pain, dyspnea on exertion, or new LE swelling Respiratory: negative for SOB or persistent cough Gastrointestinal: negative for abdominal pain, change in bowel habits or melena Genitourinary: negative for dysuria or gross hematuria, no abnormal uterine bleeding or disharge Musculoskeletal: negative for new gait disturbance or muscular weakness Integumentary: negative for new or persistent rashes, no breast lumps Neurological: negative for TIA or stroke symptoms Psychiatric: negative for SI or delusions Allergic/Immunologic: negative for hives  Patient Care Team    Relationship Specialty Notifications Start End  Leamon Arnt, MD PCP - General Family Medicine  07/06/18   Yehuda Savannah, MD Attending Physician Cardiology  07/06/12   Mauri Pole, MD Consulting Physician Gastroenterology  01/23/19     Objective  Vitals: BP 128/76   Pulse (!) 58   Temp 98.2 F (36.8 C) (Oral)   Resp 16   Ht 5\' 6"  (1.676 m)   Wt 223 lb 6.4 oz (101.3 kg)   LMP 07/06/2008 (Approximate)  SpO2 95%   BMI 36.06 kg/m  General:  Well developed, well nourished, no acute distress  Psych:  Alert and orientedx3,normal mood and affect HEENT:  Normocephalic, atraumatic, non-icteric sclera, PERRL, oropharynx is clear without mass or exudate, supple neck without adenopathy, mass or thyromegaly Cardiovascular:  Normal S1, S2, RRR without gallop, rub or murmur, nondisplaced PMI Respiratory:  Good breath sounds bilaterally, CTAB with normal respiratory effort Gastrointestinal: normal bowel sounds, soft, non-tender, no noted masses. No HSM MSK: no deformities, contusions. Joints are without erythema or swelling. Spine and CVA region are nontender Skin:  Warm, no rashes or suspicious lesions noted Neurologic:    Mental status is normal. CN 2-11 are normal. Gross motor and sensory exams are normal. Normal gait. No tremor Breast Exam: No mass,  skin retraction or nipple discharge is appreciated in either breast. No axillary adenopathy. Fibrocystic changes are not noted Pelvic Exam: Normal external genitalia, no vulvar or vaginal lesions present. Atrophic vaginal epithelium. Clear stenotic cervix w/o CMT. Bimanual exam reveals a nontender fundus w/o masses, nl size. No adnexal masses present. No inguinal adenopathy. A PAP smear was performed.    Commons side effects, risks, benefits, and alternatives for medications and treatment plan prescribed today were discussed, and the patient expressed understanding of the given instructions. Patient is instructed to call or message via MyChart if he/she has any questions or concerns regarding our treatment plan. No barriers to understanding were identified. We discussed Red Flag symptoms and signs in detail. Patient expressed understanding regarding what to do in case of urgent or emergency type symptoms.   Medication list was reconciled, printed and provided to the patient in AVS. Patient instructions and summary information was reviewed with the patient as documented in the AVS. This note was prepared with assistance of Dragon voice recognition software. Occasional wrong-word or sound-a-like substitutions may have occurred due to the inherent limitations of voice recognition software

## 2019-01-23 NOTE — Patient Instructions (Signed)
Please return in 6 months for follow up of your hypertension.  I will release your lab results to you on your MyChart account with further instructions. Please reply with any questions.   Please take the shingles vaccination prescription to the pharmacy to receive your immunization.   If you have any questions or concerns, please don't hesitate to send me a message via MyChart or call the office at [phone]. Thank you for visiting with [place] today! It's our pleasure caring for you.   Preventive Care 73-37 Years Old, Female Preventive care refers to visits with your health care provider and lifestyle choices that can promote health and wellness. This includes:  A yearly physical exam. This may also be called an annual well check.  Regular dental visits and eye exams.  Immunizations.  Screening for certain conditions.  Healthy lifestyle choices, such as eating a healthy diet, getting regular exercise, not using drugs or products that contain nicotine and tobacco, and limiting alcohol use. What can I expect for my preventive care visit? Physical exam Your health care provider will check your:  Height and weight. This may be used to calculate body mass index (BMI), which tells if you are at a healthy weight.  Heart rate and blood pressure.  Skin for abnormal spots. Counseling Your health care provider may ask you questions about your:  Alcohol, tobacco, and drug use.  Emotional well-being.  Home and relationship well-being.  Sexual activity.  Eating habits.  Work and work Statistician.  Method of birth control.  Menstrual cycle.  Pregnancy history. What immunizations do I need?  Influenza (flu) vaccine  This is recommended every year. Tetanus, diphtheria, and pertussis (Tdap) vaccine  You may need a Td booster every 10 years. Varicella (chickenpox) vaccine  You may need this if you have not been vaccinated. Zoster (shingles) vaccine  You may need this after  age 45. Measles, mumps, and rubella (MMR) vaccine  You may need at least one dose of MMR if you were born in 1957 or later. You may also need a second dose. Pneumococcal conjugate (PCV13) vaccine  You may need this if you have certain conditions and were not previously vaccinated. Pneumococcal polysaccharide (PPSV23) vaccine  You may need one or two doses if you smoke cigarettes or if you have certain conditions. Meningococcal conjugate (MenACWY) vaccine  You may need this if you have certain conditions. Hepatitis A vaccine  You may need this if you have certain conditions or if you travel or work in places where you may be exposed to hepatitis A. Hepatitis B vaccine  You may need this if you have certain conditions or if you travel or work in places where you may be exposed to hepatitis B. Haemophilus influenzae type b (Hib) vaccine  You may need this if you have certain conditions. Human papillomavirus (HPV) vaccine  If recommended by your health care provider, you may need three doses over 6 months. You may receive vaccines as individual doses or as more than one vaccine together in one shot (combination vaccines). Talk with your health care provider about the risks and benefits of combination vaccines. What tests do I need? Blood tests  Lipid and cholesterol levels. These may be checked every 5 years, or more frequently if you are over 48 years old.  Hepatitis C test.  Hepatitis B test. Screening  Lung cancer screening. You may have this screening every year starting at age 52 if you have a 30-pack-year history of smoking and  currently smoke or have quit within the past 15 years.  Colorectal cancer screening. All adults should have this screening starting at age 71 and continuing until age 52. Your health care provider may recommend screening at age 11 if you are at increased risk. You will have tests every 1-10 years, depending on your results and the type of screening  test.  Diabetes screening. This is done by checking your blood sugar (glucose) after you have not eaten for a while (fasting). You may have this done every 1-3 years.  Mammogram. This may be done every 1-2 years. Talk with your health care provider about when you should start having regular mammograms. This may depend on whether you have a family history of breast cancer.  BRCA-related cancer screening. This may be done if you have a family history of breast, ovarian, tubal, or peritoneal cancers.  Pelvic exam and Pap test. This may be done every 3 years starting at age 48. Starting at age 67, this may be done every 5 years if you have a Pap test in combination with an HPV test. Other tests  Sexually transmitted disease (STD) testing.  Bone density scan. This is done to screen for osteoporosis. You may have this scan if you are at high risk for osteoporosis. Follow these instructions at home: Eating and drinking  Eat a diet that includes fresh fruits and vegetables, whole grains, lean protein, and low-fat dairy.  Take vitamin and mineral supplements as recommended by your health care provider.  Do not drink alcohol if: ? Your health care provider tells you not to drink. ? You are pregnant, may be pregnant, or are planning to become pregnant.  If you drink alcohol: ? Limit how much you have to 0-1 drink a day. ? Be aware of how much alcohol is in your drink. In the [place] one drink equals one 12 oz bottle of beer (355 mL), one 5 oz glass of wine (148 mL), or one 1 oz glass of hard liquor (44 mL). Lifestyle  Take daily care of your teeth and gums.  Stay active. Exercise for at least 30 minutes on 5 or more days each week.  Do not use any products that contain nicotine or tobacco, such as cigarettes, e-cigarettes, and chewing tobacco. If you need help quitting, ask your health care provider.  If you are sexually active, practice safe sex. Use a condom or other form of birth control  (contraception) in order to prevent pregnancy and STIs (sexually transmitted infections).  If told by your health care provider, take low-dose aspirin daily starting at age 37. What's next?  Visit your health care provider once a year for a well check visit.  Ask your health care provider how often you should have your eyes and teeth checked.  Stay up to date on all vaccines. This information is not intended to replace advice given to you by your health care provider. Make sure you discuss any questions you have with your health care provider. Document Released: 07/24/2015 Document Revised: 03/08/2018 Document Reviewed: 03/08/2018 Elsevier Patient Education  2020 [employer].

## 2019-01-29 LAB — CYTOLOGY - PAP
Adequacy: ABSENT
Diagnosis: NEGATIVE

## 2019-02-13 ENCOUNTER — Encounter: Payer: BC Managed Care – PPO | Admitting: Family Medicine

## 2019-02-14 ENCOUNTER — Other Ambulatory Visit: Payer: Self-pay | Admitting: Family Medicine

## 2019-02-14 DIAGNOSIS — K219 Gastro-esophageal reflux disease without esophagitis: Secondary | ICD-10-CM

## 2019-03-07 ENCOUNTER — Other Ambulatory Visit: Payer: Self-pay | Admitting: Family Medicine

## 2019-03-07 DIAGNOSIS — I10 Essential (primary) hypertension: Secondary | ICD-10-CM

## 2019-05-15 ENCOUNTER — Other Ambulatory Visit: Payer: Self-pay | Admitting: Family Medicine

## 2019-08-07 ENCOUNTER — Other Ambulatory Visit: Payer: Self-pay | Admitting: Family Medicine

## 2019-08-07 DIAGNOSIS — K219 Gastro-esophageal reflux disease without esophagitis: Secondary | ICD-10-CM

## 2019-08-12 ENCOUNTER — Telehealth: Payer: Self-pay | Admitting: Family Medicine

## 2019-08-12 ENCOUNTER — Other Ambulatory Visit: Payer: Self-pay

## 2019-08-12 MED ORDER — TRAZODONE HCL 150 MG PO TABS: 150.0000 mg | ORAL_TABLET | Freq: Every day | ORAL | 0 refills | Status: DC

## 2019-08-12 NOTE — Telephone Encounter (Signed)
Ok to call in with new change in amount?

## 2019-08-12 NOTE — Telephone Encounter (Signed)
Called and let patient know has been called in.

## 2019-08-12 NOTE — Telephone Encounter (Signed)
Received message that patient wanted to talk to [place] before we called in meds. I have called and l/m to call office

## 2019-08-12 NOTE — Telephone Encounter (Signed)
MEDICATION: Trazodone 100 MG tablet  PHARMACY: [id number] [place], [place] [facility]  Comments: Pt stated she has been taking half of an extra pill at night due to not being able to sleep. Refill is not due until 2/15 but medicine is going to run out by Friday. Please advise.   **Let patient know to contact pharmacy at the end of the day to make sure medication is ready. **  ** Please notify patient to allow 48-72 hours to process**  **Encourage patient to contact the pharmacy for refills or they can request refills through [facility]**

## 2019-08-12 NOTE — Telephone Encounter (Signed)
Ok to order 3 month supply of 150mg  nightly. Please schedule her for a f/u HTN and insomnia visit. She is due.

## 2019-08-12 NOTE — Telephone Encounter (Signed)
Called to schedule appt with pt. Scheduled appt to be virtual - pt is able to check BP at home.

## 2019-08-16 ENCOUNTER — Ambulatory Visit (INDEPENDENT_AMBULATORY_CARE_PROVIDER_SITE_OTHER): Payer: BC Managed Care – PPO | Admitting: Family Medicine

## 2019-08-16 ENCOUNTER — Encounter: Payer: Self-pay | Admitting: Family Medicine

## 2019-08-16 VITALS — BP 126/74 | Ht 66.0 in | Wt 223.0 lb

## 2019-08-16 DIAGNOSIS — K219 Gastro-esophageal reflux disease without esophagitis: Secondary | ICD-10-CM

## 2019-08-16 DIAGNOSIS — I10 Essential (primary) hypertension: Secondary | ICD-10-CM

## 2019-08-16 DIAGNOSIS — M7062 Trochanteric bursitis, left hip: Secondary | ICD-10-CM

## 2019-08-16 DIAGNOSIS — F5102 Adjustment insomnia: Secondary | ICD-10-CM

## 2019-08-16 MED ORDER — OLMESARTAN MEDOXOMIL 40 MG PO TABS: 40.0000 mg | ORAL_TABLET | Freq: Every day | ORAL | 3 refills | Status: DC

## 2019-08-16 MED ORDER — AMLODIPINE BESYLATE 5 MG PO TABS: 5.0000 mg | ORAL_TABLET | Freq: Every day | ORAL | 3 refills | Status: DC

## 2019-08-16 NOTE — Progress Notes (Signed)
Virtual Visit via Video Note  Subjective  CC:  Chief Complaint  Patient presents with  . Hypertension    checks readings at home.   . Insomnia    still having problems, dog wakes her up. hard time going back to sleep.  . Gastroesophageal Reflux    nausea a few mornings. cut or carbs and fats. burping more     I connected with Sarah Palmer on 08/18/19 at  4:20 PM EST by a video enabled telemedicine application and verified that I am speaking with the correct person using two identifiers. Location patient: Home Location provider: [facility] [place], Office Persons participating in the virtual visit: Sarah Palmer, Leamon Arnt, MD Serita Sheller, [place] discussed the limitations of evaluation and management by telemedicine and the availability of in person appointments. The patient expressed understanding and agreed to proceed. HPI: Sarah Palmer is a 64 y.o. female who presents to the office today to address the problems listed above in the chief complaint.  Hypertension f/u: Control is good . Home readings are normal consistently.  Pt reports she is doing well. taking medications as instructed, no medication side effects noted, no TIAs, no chest pain on exertion, no dyspnea on exertion, no swelling of ankles. She denies adverse effects from his BP medications. Compliance with medication is good.   Insomnia, adjustment: doing well on trazadone but still requires it. Has new puppy so getting up multiple times at night but no problems with meds.   GERD: had been well controlled on chronic PPI but stopped for about a week and now with active sxs again. Improving since restarting. No melena or emesis or moderate pain.   Assessment  1. Essential hypertension   2. Insomnia, unspecified type   3. Gastroesophageal reflux disease, unspecified whether esophagitis present      Plan    Hypertension f/u: BP control is well controlled. Doing well refilled  meds.  Gerd: will monitor now that back on PPI> if not resolving, rec in person office visit for exam/labs.   Insomnia: continue trazaodone. Mood is good.  Education regarding management of these chronic disease states was given. Management strategies discussed on successive visits include dietary and exercise recommendations, goals of achieving and maintaining IBW, and lifestyle modifications aiming for adequate sleep and minimizing stressors.    Meds ordered this encounter  Medications  . olmesartan (BENICAR) 40 MG tablet    Sig: Take 1 tablet (40 mg total) by mouth daily.    Dispense:  90 tablet    Refill:  3  . amLODipine (NORVASC) 5 MG tablet    Sig: Take 1 tablet (5 mg total) by mouth daily.    Dispense:  90 tablet    Refill:  3      I reviewed the patients updated PMH, FH, and SocHx.    Patient Active Problem List   Diagnosis Date Noted  . Insomnia 08/12/2016    Priority: High  . Essential hypertension     Priority: High  . History of melanoma     Priority: High  . Leukocytosis 07/06/2018    Priority: Medium  . Class 2 obesity with body mass index (BMI) of 37.0 to 37.9 in adult 01/10/2017    Priority: Medium  . Flushing 09/01/2016    Priority: Medium  . Gastroesophageal reflux disease 08/13/2016    Priority: Medium  . History of alcohol abuse     Priority: Medium  .  Chest pain     Priority: Low  . Screening for colorectal cancer 12/12/2018   Current Meds  Medication Sig  . amLODipine (NORVASC) 5 MG tablet Take 1 tablet (5 mg total) by mouth daily.  . Flaxseed Oil OIL by Does not apply route.  . metoprolol succinate (TOPROL-XL) 100 MG 24 hr tablet TAKE 1 TABLET (100 MG TOTAL) BY MOUTH DAILY. TAKE WITH OR IMMEDIATELY FOLLOWING A MEAL.  Marland Kitchen olmesartan (BENICAR) 40 MG tablet Take 1 tablet (40 mg total) by mouth daily.  . pantoprazole (PROTONIX) 40 MG tablet TAKE 1 TABLET BY MOUTH EVERY DAY AS NEEDED  . traZODone (DESYREL) 150 MG tablet Take 1 tablet (150 mg total)  by mouth at bedtime.  . [DISCONTINUED] amLODipine (NORVASC) 5 MG tablet TAKE 1 TABLET BY MOUTH EVERY DAY  . [DISCONTINUED] olmesartan (BENICAR) 40 MG tablet TAKE 1 TABLET BY MOUTH EVERY DAY    Allergies: Patient is allergic to penicillins and thiazide-type diuretics. Family History: Patient family history includes COPD in her father; Colon polyps in her mother; Healthy in her mother, sister, and sister; Hypertension in an other family member; Obesity in her sister. Social History:  Patient  reports that she quit smoking about 5 years ago. Her smoking use included cigarettes. She has a 10.00 pack-year smoking history. She has never used smokeless tobacco. She reports that she does not drink alcohol or use drugs.  Review of Systems: Constitutional: Negative for fever malaise or anorexia Cardiovascular: negative for chest pain Respiratory: negative for SOB or persistent cough Gastrointestinal: negative for abdominal pain  OBJECTIVE Vitals: BP 126/74 (BP Location: Right Arm, Patient Position: Sitting)   Ht 5\' 6"  (1.676 m)   Wt 223 lb (101.2 kg)   LMP 07/06/2008 (Approximate)   BMI 35.99 kg/m  General: no acute distress , A&Ox3  Leamon Arnt, MD         Subjective  CC:  Chief Complaint  Patient presents with  . Hypertension    checks readings at home.   . Insomnia    still having problems, dog wakes her up. hard time going back to sleep.  . Gastroesophageal Reflux    nausea a few mornings. cut or carbs and fats. burping more      Follow up: July for cpe  No orders of the defined types were placed in this encounter.  No orders of the defined types were placed in this encounter.     BP Readings from Last 3 Encounters:  08/16/19 126/74  01/23/19 128/76  07/25/18 140/68   Wt Readings from Last 3 Encounters:  08/16/19 223 lb (101.2 kg)  01/23/19 223 lb 6.4 oz (101.3 kg)  07/25/18 215 lb (97.5 kg)    Lab Results  Component Value Date   CHOL 203 (H)  01/23/2019   CHOL 212 (H) 01/19/2018   CHOL 179 01/10/2017   Lab Results  Component Value Date   HDL 56.40 01/23/2019   HDL 55.60 01/19/2018   HDL 52.80 01/10/2017   Lab Results  Component Value Date   LDLCALC 128 (H) 01/19/2018   LDLCALC 111 (H) 01/10/2017   LDLCALC (H) 11/02/2007    144        Total Cholesterol/HDL:CHD Risk Coronary Heart Disease Risk Table                     Men   Women  1/2 Average Risk   3.4   3.3   Lab Results  Component Value Date  TRIG 316.0 (H) 01/23/2019   TRIG 146.0 01/19/2018   TRIG 77.0 01/10/2017   Lab Results  Component Value Date   CHOLHDL 4 01/23/2019   CHOLHDL 4 01/19/2018   CHOLHDL 3 01/10/2017   Lab Results  Component Value Date   LDLDIRECT 127.0 01/23/2019   Lab Results  Component Value Date   CREATININE 0.69 01/23/2019   BUN 14 01/23/2019   NA 137 01/23/2019   K 4.1 01/23/2019   CL 103 01/23/2019   CO2 26 01/23/2019    The 10-year ASCVD risk score Mikey Bussing DC Jr., et al., 2013) is: 5.3%   Values used to calculate the score:     Age: 59 years     Sex: Female     Is Non-Hispanic African American: No     Diabetic: No     Tobacco smoker: No     Systolic Blood Pressure: 123XX123 mmHg     Is BP treated: Yes     HDL Cholesterol: 56.4 mg/dL     Total Cholesterol: 203 mg/dL  I reviewed the patients updated PMH, FH, and SocHx.    Patient Active Problem List   Diagnosis Date Noted  . Insomnia 08/12/2016    Priority: High  . Essential hypertension     Priority: High  . History of melanoma     Priority: High  . Leukocytosis 07/06/2018    Priority: Medium  . Class 2 obesity with body mass index (BMI) of 37.0 to 37.9 in adult 01/10/2017    Priority: Medium  . Flushing 09/01/2016    Priority: Medium  . Gastroesophageal reflux disease 08/13/2016    Priority: Medium  . History of alcohol abuse     Priority: Medium  . Chest pain     Priority: Low  . Screening for colorectal cancer 12/12/2018    Allergies: Penicillins  and Thiazide-type diuretics  Social History: Patient  reports that she quit smoking about 5 years ago. Her smoking use included cigarettes. She has a 10.00 pack-year smoking history. She has never used smokeless tobacco. She reports that she does not drink alcohol or use drugs.  Current Meds  Medication Sig  . amLODipine (NORVASC) 5 MG tablet TAKE 1 TABLET BY MOUTH EVERY DAY  . Flaxseed Oil OIL by Does not apply route.  . metoprolol succinate (TOPROL-XL) 100 MG 24 hr tablet TAKE 1 TABLET (100 MG TOTAL) BY MOUTH DAILY. TAKE WITH OR IMMEDIATELY FOLLOWING A MEAL.  Marland Kitchen olmesartan (BENICAR) 40 MG tablet TAKE 1 TABLET BY MOUTH EVERY DAY  . pantoprazole (PROTONIX) 40 MG tablet TAKE 1 TABLET BY MOUTH EVERY DAY AS NEEDED  . traZODone (DESYREL) 150 MG tablet Take 1 tablet (150 mg total) by mouth at bedtime.    Review of Systems: Cardiovascular: negative for chest pain, palpitations, leg swelling, orthopnea Respiratory: negative for SOB, wheezing or persistent cough Gastrointestinal: negative for abdominal pain Genitourinary: negative for dysuria or gross hematuria  Objective  Vitals: BP 126/74 (BP Location: Right Arm, Patient Position: Sitting)   Ht 5\' 6"  (1.676 m)   Wt 223 lb (101.2 kg)   LMP 07/06/2008 (Approximate)   BMI 35.99 kg/m  General: no acute distress  Psych:  Alert and oriented, normal mood and affect HEENT:  Normocephalic, atraumatic, supple neck  Cardiovascular:  RRR without murmur. no edema Respiratory:  Good breath sounds bilaterally, CTAB with normal respiratory effort Skin:  Warm, no rashes Neurologic:   Mental status is normal  Commons side effects, risks, benefits, and alternatives for  medications and treatment plan prescribed today were discussed, and the patient expressed understanding of the given instructions. Patient is instructed to call or message via MyChart if he/she has any questions or concerns regarding our treatment plan. No barriers to understanding were  identified. We discussed Red Flag symptoms and signs in detail. Patient expressed understanding regarding what to do in case of urgent or emergency type symptoms.   Medication list was reconciled, printed and provided to the patient in AVS. Patient instructions and summary information was reviewed with the patient as documented in the AVS. This note was prepared with assistance of Dragon voice recognition software. Occasional wrong-word or sound-a-like substitutions may have occurred due to the inherent limitations of voice recognition software  This visit occurred during the SARS-CoV-2 public health emergency.  Safety protocols were in place, including screening questions prior to the visit, additional usage of staff PPE, and extensive cleaning of exam room while observing appropriate contact time as indicated for disinfecting solutions.

## 2019-10-19 ENCOUNTER — Other Ambulatory Visit: Payer: Self-pay | Admitting: Family Medicine

## 2019-10-23 ENCOUNTER — Other Ambulatory Visit: Payer: Self-pay | Admitting: Family Medicine

## 2019-10-23 NOTE — Telephone Encounter (Signed)
Pt requesting Trazadone 150mg   LOV: 08/16/2019 No upcoming visits  Approve?

## 2019-11-22 ENCOUNTER — Other Ambulatory Visit: Payer: Self-pay | Admitting: Family Medicine

## 2019-11-22 DIAGNOSIS — K219 Gastro-esophageal reflux disease without esophagitis: Secondary | ICD-10-CM

## 2020-01-02 LAB — HM MAMMOGRAPHY

## 2020-01-03 ENCOUNTER — Other Ambulatory Visit: Payer: Self-pay

## 2020-01-27 ENCOUNTER — Ambulatory Visit: Payer: BC Managed Care – PPO | Admitting: Family Medicine

## 2020-02-26 ENCOUNTER — Ambulatory Visit: Payer: 59 | Admitting: Family Medicine

## 2020-03-29 ENCOUNTER — Other Ambulatory Visit: Payer: Self-pay | Admitting: Family Medicine

## 2020-05-27 ENCOUNTER — Encounter: Payer: BC Managed Care – PPO | Admitting: Family Medicine

## 2020-06-11 ENCOUNTER — Other Ambulatory Visit: Payer: Self-pay | Admitting: Family Medicine

## 2020-07-25 ENCOUNTER — Other Ambulatory Visit: Payer: Self-pay | Admitting: Family Medicine

## 2020-07-25 DIAGNOSIS — I10 Essential (primary) hypertension: Secondary | ICD-10-CM

## 2020-08-29 ENCOUNTER — Other Ambulatory Visit: Payer: Self-pay | Admitting: Family Medicine

## 2020-08-31 ENCOUNTER — Other Ambulatory Visit: Payer: Self-pay | Admitting: Family Medicine

## 2020-09-07 ENCOUNTER — Ambulatory Visit (INDEPENDENT_AMBULATORY_CARE_PROVIDER_SITE_OTHER): Payer: 59 | Admitting: Family Medicine

## 2020-09-07 ENCOUNTER — Other Ambulatory Visit: Payer: Self-pay

## 2020-09-07 ENCOUNTER — Encounter: Payer: Self-pay | Admitting: Family Medicine

## 2020-09-07 VITALS — BP 136/78 | HR 66 | Temp 98.0°F | Resp 16 | Ht 66.0 in | Wt 232.8 lb

## 2020-09-07 DIAGNOSIS — Z0000 Encounter for general adult medical examination without abnormal findings: Secondary | ICD-10-CM | POA: Diagnosis not present

## 2020-09-07 DIAGNOSIS — Z6837 Body mass index (BMI) 37.0-37.9, adult: Secondary | ICD-10-CM

## 2020-09-07 DIAGNOSIS — Z79899 Other long term (current) drug therapy: Secondary | ICD-10-CM

## 2020-09-07 DIAGNOSIS — D72829 Elevated white blood cell count, unspecified: Secondary | ICD-10-CM

## 2020-09-07 DIAGNOSIS — K219 Gastro-esophageal reflux disease without esophagitis: Secondary | ICD-10-CM

## 2020-09-07 DIAGNOSIS — F5102 Adjustment insomnia: Secondary | ICD-10-CM | POA: Diagnosis not present

## 2020-09-07 DIAGNOSIS — I10 Essential (primary) hypertension: Secondary | ICD-10-CM

## 2020-09-07 MED ORDER — TRAZODONE HCL 150 MG PO TABS: 150.0000 mg | ORAL_TABLET | Freq: Every day | ORAL | 3 refills | Status: DC

## 2020-09-07 MED ORDER — PANTOPRAZOLE SODIUM 20 MG PO TBEC: 20.0000 mg | DELAYED_RELEASE_TABLET | Freq: Every day | ORAL | 3 refills | Status: DC | PRN

## 2020-09-07 MED ORDER — OLMESARTAN MEDOXOMIL 40 MG PO TABS
40.0000 mg | ORAL_TABLET | Freq: Every day | ORAL | 3 refills | Status: DC
Start: 2020-09-07 — End: 2021-09-17

## 2020-09-07 MED ORDER — AMLODIPINE BESYLATE 5 MG PO TABS
5.0000 mg | ORAL_TABLET | Freq: Every day | ORAL | 3 refills | Status: DC
Start: 2020-09-07 — End: 2021-09-17

## 2020-09-07 MED ORDER — METOPROLOL SUCCINATE ER 100 MG PO TB24: 100.0000 mg | ORAL_TABLET | Freq: Every day | ORAL | 3 refills | Status: DC

## 2020-09-07 NOTE — Progress Notes (Signed)
Subjective  Chief Complaint  Patient presents with  . Annual Exam    Fasting   . Bunions    Right foot     HPI: Sarah Palmer is a 65 y.o. female who presents to [facility] today for a Female Wellness Visit. She also has the concerns and/or needs as listed above in the chief complaint. These will be addressed in addition to the Health Maintenance Visit.   Wellness Visit: annual visit with health maintenance review and exam without Pap   HM: mamm and pap up to date. CRC normal and up to date.   imms up to date Chronic disease f/u and/or acute problem visit: (deemed necessary to be done in addition to the wellness visit):  HTN: Feeling well. Taking medications w/o adverse effects. No symptoms of CHF, angina; no palpitations, sob, cp or lower extremity edema. Compliant with meds.  Fasting for lipids today.  Obesity: Eats intermittent meals and sedentary lifestyle.  Has gained weight since last year.  Currently due to Covid.  Chronic GERD on chronic PPI.  Takes Protonix 40 mg daily.  She does note return her symptoms that she misses more than 2 days.  No melena.  No hematemesis.  Adjustment insomnia: Possibly now primary insomnia.  Uses trazodone 150 mg nightly and this helps her sleep.  No adverse effects.  Assessment  1. Annual physical exam   2. Essential hypertension   3. Class 2 severe obesity with serious comorbidity and body mass index (BMI) of 37.0 to 37.9 in adult, unspecified obesity type ([place])   4. Leukocytosis, unspecified type   5. Adjustment insomnia   6. Long-term current use of proton pump inhibitor therapy   7. Gastroesophageal reflux disease      Plan  Female Wellness Visit:  Age appropriate Health Maintenance and Prevention measures were discussed with patient. Included topics are cancer screening recommendations, ways to keep healthy (see AVS) including dietary and exercise recommendations, regular eye and dental care, use of seat  belts, and avoidance of moderate alcohol use and tobacco use.  Screens are up-to-date  BMI: discussed patient's BMI and encouraged positive lifestyle modifications to help get to or maintain a target BMI.  HM needs and immunizations were addressed and ordered. See below for orders. See HM and immunization section for updates.  Up-to-date  Routine labs and screening tests ordered including cmp, cbc and lipids where appropriate.  Discussed recommendations regarding Vit D and calcium supplementation (see AVS)  Chronic disease management visit and/or acute problem visit:  Discussed obesity and need for increasing activity change in diet.  Hypertension is well controlled on current medications.  Refilled amlodipine 5 mg daily, olmesartan 40 mg daily, and Toprol-XL 100 mg daily.  Recheck renal function electrolytes and lipids today.  Leukocytosis idiopathic: Monitor annually  Insomnia: Continues to be well controlled on trazodone 150 mg nightly.  Refill.  GERD chronic PPI: We will try decreasing dose to 20 mg daily.  Check vitamin B12 levels.  Obesity: Check TSH   Follow up: 6 months for blood pressure recheck Orders Placed This Encounter  Procedures  . CBC with Differential/Platelet  . Comprehensive metabolic panel  . Lipid panel  . TSH  . Vitamin B12   Meds ordered this encounter  Medications  . traZODone (DESYREL) 150 MG tablet    Sig: Take 1 tablet (150 mg total) by mouth at bedtime.    Dispense:  90 tablet    Refill:  3  .  olmesartan (BENICAR) 40 MG tablet    Sig: Take 1 tablet (40 mg total) by mouth daily.    Dispense:  90 tablet    Refill:  3  . metoprolol succinate (TOPROL-XL) 100 MG 24 hr tablet    Sig: Take 1 tablet (100 mg total) by mouth daily. Take with or immediately following a meal.    Dispense:  90 tablet    Refill:  3  . amLODipine (NORVASC) 5 MG tablet    Sig: Take 1 tablet (5 mg total) by mouth daily.    Dispense:  90 tablet    Refill:  3  .  pantoprazole (PROTONIX) 20 MG tablet    Sig: Take 1 tablet (20 mg total) by mouth daily as needed.    Dispense:  90 tablet    Refill:  3      Body mass index is 37.57 kg/m. Wt Readings from Last 3 Encounters:  09/07/20 232 lb 12.8 oz (105.6 kg)  08/16/19 223 lb (101.2 kg)  01/23/19 223 lb 6.4 oz (101.3 kg)     Patient Active Problem List   Diagnosis Date Noted  . Insomnia 08/12/2016    Priority: High  . Essential hypertension     Priority: High  . History of melanoma     Priority: High    resected from lower extremity   . Leukocytosis 07/06/2018    Priority: Medium    Negative eval by hematology 2018   . Class 2 obesity with body mass index (BMI) of 37.0 to 37.9 in adult 01/10/2017    Priority: Medium  . Flushing 09/01/2016    Priority: Medium    Evaluated by Dr. Loanne Drilling 2018 for pheochromocytoma; urine and initial lab tests were normal. To repeat testing if symptoms recur.    . Gastroesophageal reflux disease 08/13/2016    Priority: Medium  . History of alcohol abuse     Priority: Medium    Patient reports being in recovery for years and associated with a formal treatment program.   . Atypical chest pain     Priority: Low    +Palpitations; negative chest CT in 2009; associated with URI in 06/2012; CT reviewed with radiologist-minimal, if any, coronary/aortic calcification.   . Screening for colorectal cancer 12/12/2018    + cologuard with normal f/u colonoscopy 07/2018. Repeat in 10 years for routine screening. No more cologuards since 1st was abnormal    Health Maintenance  Topic Date Due  . MAMMOGRAM  01/01/2021  . PAP SMEAR-Modifier  01/22/2022  . TETANUS/TDAP  07/11/2025  . COLONOSCOPY (Pts 45-51yrs Insurance coverage will need to be confirmed)  07/25/2028  . INFLUENZA VACCINE  Completed  . COVID-19 Vaccine  Completed  . Hepatitis C Screening  Completed  . HIV Screening  Discontinued   Immunization History  Administered Date(s) Administered  .  Influenza,inj,Quad PF,6+ Mos 07/06/2018, 05/29/2019, 05/14/2020  . Moderna Sars-Covid-2 Vaccination 09/19/2019, 10/10/2019, 05/14/2020  . Zoster Recombinat (Shingrix) 02/23/2019, 06/15/2019   We updated and reviewed the patient's past history in detail and it is documented below. Allergies: Patient is allergic to penicillins and thiazide-type diuretics. Past Medical History Patient  has a past medical history of Acid reflux, Anxiety, Chest pain (2009), History of alcohol abuse (2009), History of seizure - electrolyte abnormality (09/26/2013), Hyperlipidemia, Hypertension, Insomnia, Leukocytosis (07/06/2018), Melanoma ([place]) (1999), and Tobacco abuse. Past Surgical History Patient  has a past surgical history that includes Melanoma excision (1999) and Middle ear surgery (2017). Family History: Patient family history  includes COPD in her father; Colon polyps in her mother; Healthy in her mother, sister, and sister; Hypertension in an other family member; Obesity in her sister. Social History:  Patient  reports that she quit smoking about 7 years ago. Her smoking use included cigarettes. She has a 10.00 pack-year smoking history. She has never used smokeless tobacco. She reports that she does not drink alcohol and does not use drugs.  Review of Systems: Constitutional: negative for fever or malaise Ophthalmic: negative for photophobia, double vision or loss of vision Cardiovascular: negative for chest pain, dyspnea on exertion, or new LE swelling Respiratory: negative for SOB or persistent cough Gastrointestinal: negative for abdominal pain, change in bowel habits or melena Genitourinary: negative for dysuria or gross hematuria, no abnormal uterine bleeding or disharge Musculoskeletal: negative for new gait disturbance or muscular weakness Integumentary: negative for new or persistent rashes, no breast lumps Neurological: negative for TIA or stroke symptoms Psychiatric: negative for SI or  delusions Allergic/Immunologic: negative for hives  Patient Care Team    Relationship Specialty Notifications Start End  Leamon Arnt, MD PCP - General Family Medicine  07/06/18   Yehuda Savannah, MD (Inactive) Attending Physician Cardiology  07/06/12   Mauri Pole, MD Consulting Physician Gastroenterology  01/23/19     Objective  Vitals: BP 136/78   Pulse 66   Temp 98 F (36.7 C) (Temporal)   Resp 16   Ht 5\' 6"  (1.676 m)   Wt 232 lb 12.8 oz (105.6 kg)   LMP 07/06/2008 (Approximate)   SpO2 96%   BMI 37.57 kg/m  General:  Well developed, well nourished, no acute distress  Psych:  Alert and orientedx3,normal mood and affect HEENT:  Normocephalic, atraumatic, non-icteric sclera,  supple neck without adenopathy, mass or thyromegaly Cardiovascular:  Normal S1, S2, RRR without gallop, rub or murmur Respiratory:  Good breath sounds bilaterally, CTAB with normal respiratory effort Gastrointestinal: normal bowel sounds, soft, non-tender, no noted masses. No HSM MSK: no deformities, contusions. Joints are without erythema or swelling.  Skin:  Warm, no rashes or suspicious lesions noted Neurologic:    Mental status is normal. CN 2-11 are normal. Gross motor and sensory exams are normal. Normal gait. No tremor Breast Exam: No mass, skin retraction or nipple discharge is appreciated in either breast. No axillary adenopathy. Fibrocystic changes are not noted   Commons side effects, risks, benefits, and alternatives for medications and treatment plan prescribed today were discussed, and the patient expressed understanding of the given instructions. Patient is instructed to call or message via MyChart if he/she has any questions or concerns regarding our treatment plan. No barriers to understanding were identified. We discussed Red Flag symptoms and signs in detail. Patient expressed understanding regarding what to do in case of urgent or emergency type symptoms.   Medication list  was reconciled, printed and provided to the patient in AVS. Patient instructions and summary information was reviewed with the patient as documented in the AVS. This note was prepared with assistance of Dragon voice recognition software. Occasional wrong-word or sound-a-like substitutions may have occurred due to the inherent limitations of voice recognition software  This visit occurred during the SARS-CoV-2 public health emergency.  Safety protocols were in place, including screening questions prior to the visit, additional usage of staff PPE, and extensive cleaning of exam room while observing appropriate contact time as indicated for disinfecting solutions.

## 2020-09-07 NOTE — Patient Instructions (Signed)
Please return in 6 months for hypertension follow up.  I will release your lab results to you on your MyChart account with further instructions. Please reply with any questions.   I have ordered the 20mg  tablet of protonix. This formulation should not be cut in half so try the smaller pill to see if it can control your symptoms.   If you have any questions or concerns, please don't hesitate to send me a message via MyChart or call the office at [phone]. Thank you for visiting with [place] today! It's our pleasure caring for you.   Fat and Cholesterol Restricted Eating Plan Getting too much fat and cholesterol in your diet may cause health problems. Choosing the right foods helps keep your fat and cholesterol at normal levels. This can keep you from getting certain diseases. Your doctor may recommend an eating plan that includes:  Total fat: ______% or less of total calories a day.  Saturated fat: ______% or less of total calories a day.  Cholesterol: less than _________mg a day.  Fiber: ______g a day. What are tips for following this plan? Meal planning  At meals, divide your plate into four equal parts: ? Fill one-half of your plate with vegetables and green salads. ? Fill one-fourth of your plate with whole grains. ? Fill one-fourth of your plate with low-fat (lean) protein foods.  Eat fish that is high in omega-3 fats at least two times a week. This includes mackerel, tuna, sardines, and salmon.  Eat foods that are high in fiber, such as whole grains, beans, apples, broccoli, carrots, peas, and barley. General tips  Work with your doctor to lose weight if you need to.  Avoid: ? Foods with added sugar. ? Fried foods. ? Foods with partially hydrogenated oils.  Limit alcohol intake to no more than 1 drink a day for nonpregnant women and 2 drinks a day for men. One drink equals 12 oz of beer, 5 oz of wine, or 1 oz of hard liquor.   Reading food labels  Check food labels  for: ? Trans fats. ? Partially hydrogenated oils. ? Saturated fat (g) in each serving. ? Cholesterol (mg) in each serving. ? Fiber (g) in each serving.  Choose foods with healthy fats, such as: ? Monounsaturated fats. ? Polyunsaturated fats. ? Omega-3 fats.  Choose grain products that have whole grains. Look for the word "whole" as the first word in the ingredient list. Cooking  Cook foods using low-fat methods. These include baking, boiling, grilling, and broiling.  Eat more home-cooked foods. Eat at restaurants and buffets less often.  Avoid cooking using saturated fats, such as butter, cream, palm oil, palm kernel oil, and coconut oil. Recommended foods Fruits  All fresh, canned (in natural juice), or frozen fruits. Vegetables  Fresh or frozen vegetables (raw, steamed, roasted, or grilled). Green salads. Grains  Whole grains, such as whole wheat or whole grain breads, crackers, cereals, and pasta. Unsweetened oatmeal, bulgur, barley, quinoa, or brown rice. Corn or whole wheat flour tortillas. Meats and other protein foods  Ground beef (85% or leaner), grass-fed beef, or beef trimmed of fat. Skinless chicken or [place]. Ground chicken or [place]. Pork trimmed of fat. All fish and seafood. Egg whites. Dried beans, peas, or lentils. Unsalted nuts or seeds. Unsalted canned beans. Nut butters without added sugar or oil. Dairy  Low-fat or nonfat dairy products, such as skim or 1% milk, 2% or reduced-fat cheeses, low-fat and fat-free ricotta or cottage cheese, or plain  low-fat and nonfat yogurt. Fats and oils  Tub margarine without trans fats. Light or reduced-fat mayonnaise and salad dressings. Avocado. Olive, canola, sesame, or safflower oils. The items listed above may not be a complete list of foods and beverages you can eat. Contact a dietitian for more information.   Foods to avoid Fruits  Canned fruit in heavy syrup. Fruit in cream or butter sauce. Fried  fruit. Vegetables  Vegetables cooked in cheese, cream, or butter sauce. Fried vegetables. Grains  White bread. White pasta. White rice. Cornbread. Bagels, pastries, and croissants. Crackers and snack foods that contain trans fat and hydrogenated oils. Meats and other protein foods  Fatty cuts of meat. Ribs, chicken wings, bacon, sausage, bologna, salami, chitterlings, fatback, hot dogs, bratwurst, and packaged lunch meats. Liver and organ meats. Whole eggs and egg yolks. Chicken and [place] with skin. Fried meat. Dairy  Whole or 2% milk, cream, half-and-half, and cream cheese. Whole milk cheeses. Whole-fat or sweetened yogurt. Full-fat cheeses. Nondairy creamers and whipped toppings. Processed cheese, cheese spreads, and cheese curds. Beverages  Alcohol. Sugar-sweetened drinks such as sodas, lemonade, and fruit drinks. Fats and oils  Butter, stick margarine, lard, shortening, ghee, or bacon fat. Coconut, palm kernel, and palm oils. Sweets and desserts  Corn syrup, sugars, honey, and molasses. Candy. Jam and jelly. Syrup. Sweetened cereals. Cookies, pies, cakes, donuts, muffins, and ice cream. The items listed above may not be a complete list of foods and beverages you should avoid. Contact a dietitian for more information. Summary  Choosing the right foods helps keep your fat and cholesterol at normal levels. This can keep you from getting certain diseases.  At meals, fill one-half of your plate with vegetables and green salads.  Eat high-fiber foods, like whole grains, beans, apples, carrots, peas, and barley.  Limit added sugar, saturated fats, alcohol, and fried foods. This information is not intended to replace advice given to you by your health care provider. Make sure you discuss any questions you have with your health care provider. Document Revised: 10/30/2019 Document Reviewed: 10/30/2019 Elsevier Patient Education  2021 [employer].

## 2020-09-08 LAB — CBC WITH DIFFERENTIAL/PLATELET
Basophils Absolute: 0.1 10*3/uL (ref 0.0–0.1)
Basophils Relative: 1.2 % (ref 0.0–3.0)
Eosinophils Absolute: 0.2 10*3/uL (ref 0.0–0.7)
Eosinophils Relative: 2.3 % (ref 0.0–5.0)
HCT: 41.7 % (ref 36.0–46.0)
Hemoglobin: 14 g/dL (ref 12.0–15.0)
Lymphocytes Relative: 30.3 % (ref 12.0–46.0)
Lymphs Abs: 3.2 10*3/uL (ref 0.7–4.0)
MCHC: 33.6 g/dL (ref 30.0–36.0)
MCV: 82.2 fl (ref 78.0–100.0)
Monocytes Absolute: 0.8 10*3/uL (ref 0.1–1.0)
Monocytes Relative: 7.8 % (ref 3.0–12.0)
Neutro Abs: 6.1 10*3/uL (ref 1.4–7.7)
Neutrophils Relative %: 58.4 % (ref 43.0–77.0)
Platelets: 294 10*3/uL (ref 150.0–400.0)
RBC: 5.07 Mil/uL (ref 3.87–5.11)
RDW: 14.4 % (ref 11.5–15.5)
WBC: 10.4 10*3/uL (ref 4.0–10.5)

## 2020-09-08 LAB — COMPREHENSIVE METABOLIC PANEL
ALT: 10 U/L (ref 0–35)
AST: 12 U/L (ref 0–37)
Albumin: 4.1 g/dL (ref 3.5–5.2)
Alkaline Phosphatase: 71 U/L (ref 39–117)
BUN: 18 mg/dL (ref 6–23)
CO2: 28 mEq/L (ref 19–32)
Calcium: 8.7 mg/dL (ref 8.4–10.5)
Chloride: 101 mEq/L (ref 96–112)
Creatinine, Ser: 0.78 mg/dL (ref 0.40–1.20)
GFR: 80.66 mL/min (ref >60.00)
Glucose, Bld: 94 mg/dL (ref 70–99)
Potassium: 3.8 mEq/L (ref 3.5–5.1)
Sodium: 136 mEq/L (ref 135–145)
Total Bilirubin: 0.3 mg/dL (ref 0.2–1.2)
Total Protein: 6.6 g/dL (ref 6.0–8.3)

## 2020-09-08 LAB — TSH: TSH: 0.98 u[IU]/mL (ref 0.35–4.50)

## 2020-09-08 LAB — LDL CHOLESTEROL, DIRECT: Direct LDL: 120 mg/dL

## 2020-09-08 LAB — LIPID PANEL
Cholesterol: 199 mg/dL (ref 0–200)
HDL: 54.1 mg/dL (ref >39.00)
Total CHOL/HDL Ratio: 4
Triglycerides: 433 mg/dL — ABNORMAL HIGH (ref 0.0–149.0)

## 2020-09-08 LAB — VITAMIN B12: Vitamin B-12: 413 pg/mL (ref 211–911)

## 2020-09-11 MED ORDER — FENOFIBRATE 145 MG PO TABS: 145.0000 mg | ORAL_TABLET | Freq: Every day | ORAL | 3 refills | Status: DC

## 2020-09-11 NOTE — Addendum Note (Signed)
Addended by: Billey Chang on: 09/11/2020 10:52 AM   Modules accepted: Orders

## 2020-10-26 ENCOUNTER — Other Ambulatory Visit: Payer: Self-pay | Admitting: Family Medicine

## 2020-10-26 DIAGNOSIS — K219 Gastro-esophageal reflux disease without esophagitis: Secondary | ICD-10-CM

## 2020-11-06 ENCOUNTER — Other Ambulatory Visit: Payer: Self-pay

## 2020-11-06 ENCOUNTER — Telehealth: Payer: Self-pay

## 2020-11-06 DIAGNOSIS — K219 Gastro-esophageal reflux disease without esophagitis: Secondary | ICD-10-CM

## 2020-11-06 MED ORDER — PANTOPRAZOLE SODIUM 20 MG PO TBEC: 20.0000 mg | DELAYED_RELEASE_TABLET | Freq: Every day | ORAL | 3 refills | Status: DC | PRN

## 2020-11-06 NOTE — Telephone Encounter (Signed)
.   LAST APPOINTMENT DATE: 09/07/2020   NEXT APPOINTMENT DATE:@Visit  date not found  MEDICATION:pantoprazole (PROTONIX) 20 MG tablet   PHARMACY:[id number] - [place], [place] - [address] AT [facility]

## 2020-11-06 NOTE — Telephone Encounter (Signed)
Refill sent to pharrmacy

## 2021-01-07 LAB — HM MAMMOGRAPHY

## 2021-02-04 ENCOUNTER — Encounter: Payer: Self-pay | Admitting: Family Medicine

## 2021-02-18 ENCOUNTER — Other Ambulatory Visit: Payer: Self-pay | Admitting: Family Medicine

## 2021-03-03 ENCOUNTER — Ambulatory Visit: Payer: 59 | Admitting: Physician Assistant

## 2021-03-19 ENCOUNTER — Ambulatory Visit (INDEPENDENT_AMBULATORY_CARE_PROVIDER_SITE_OTHER): Payer: 59 | Admitting: Physician Assistant

## 2021-03-19 ENCOUNTER — Other Ambulatory Visit: Payer: Self-pay

## 2021-03-19 ENCOUNTER — Encounter: Payer: Self-pay | Admitting: Physician Assistant

## 2021-03-19 VITALS — BP 148/90 | HR 60 | Temp 97.0°F | Ht 66.0 in | Wt 230.0 lb

## 2021-03-19 DIAGNOSIS — M25512 Pain in left shoulder: Secondary | ICD-10-CM | POA: Diagnosis not present

## 2021-03-19 DIAGNOSIS — Z23 Encounter for immunization: Secondary | ICD-10-CM

## 2021-03-19 MED ORDER — METHYLPREDNISOLONE 4 MG PO TBPK: ORAL_TABLET | ORAL | 0 refills | Status: DC

## 2021-03-19 NOTE — Patient Instructions (Addendum)
Good to meet you today. Please get an XRAY of your Left shoulder at [facility] - see attached instructions. Start on the Medrol dose pak and follow directions on package. Use Biofreeze / Thermacare patches. Consider PT  Flu shot given today

## 2021-03-19 NOTE — Progress Notes (Signed)
Acute Office Visit  Subjective:    Patient ID: Sarah Palmer, female    DOB: 1955-12-13, 65 y.o.   MRN: [id number]  Chief Complaint  Patient presents with   Shoulder Pain    Left shoulder x 4 weeks   Back Pain    X 4 weeks    HPI Patient is in today for left shoulder pain x 4 weeks. NKI. States it hurts to lift it up too long and has been hearing a lot of popping / cracking. Pain came on suddenly. States she sleeps on the left side. No prior issues with this shoulder. No new hobbies or exercises. She has tried some Advil intermittently with some relief.  Past Medical History:  Diagnosis Date   Acid reflux    Anxiety    Chest pain 2009   associated with palpitations; negative chest CT in 2009   History of alcohol abuse 2009   History of seizure - electrolyte abnormality 09/26/2013   Hyperlipidemia    Hypertension    Insomnia    Leukocytosis 07/06/2018   Negative eval by hematology 2018   Melanoma ([place]) 1999   resected from lower extremity   Tobacco abuse     Past Surgical History:  Procedure Laterality Date   MELANOMA EXCISION  1999   lower extremity   MIDDLE EAR SURGERY  2017   osteoma    Family History  Problem Relation Age of Onset   Healthy Mother    Colon polyps Mother    COPD Father    Hypertension Other    Healthy Sister    Obesity Sister    Healthy Sister    Other Neg Hx        pheochromocytoma   Heart disease Neg Hx    Cancer Neg Hx    Colon cancer Neg Hx    Esophageal cancer Neg Hx    Stomach cancer Neg Hx    Rectal cancer Neg Hx     Social History   Socioeconomic History   Marital status: Widowed    Spouse name: Not on file   Number of children: 0   Years of education: Not on file   Highest education level: Not on file  Occupational History   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   Occupation: Pharmacologist, [employer]  Tobacco Use   Smoking status: Former    Packs/day: 0.25     Years: 40.00    Pack years: 10.00    Types: Cigarettes    Quit date: 09/04/2013    Years since quitting: 7.5   Smokeless tobacco: Never   Tobacco comments:    first quit attempt in 06/2012  Vaping Use   Vaping Use: Never used  Substance and Sexual Activity   Alcohol use: No    Alcohol/week: 2.0 - 3.0 standard drinks    Types: 2 - 3 Standard drinks or equivalent per week    Comment: excessive alcohol use in the past noted in 2009   Drug use: No   Sexual activity: Yes    Birth control/protection: Post-menopausal  Other Topics Concern   Not on file  Social History Narrative   Widowed 2019; works full time, smoker. Former drinker   Social Determinants of Radio broadcast assistant Strain: Not on file  Food Insecurity: Not on file  Transportation Needs: Not on file  Physical Activity: Not on file  Stress: Not on file  Social Connections:  Not on file  Intimate Partner Violence: Not on file    Outpatient Medications Prior to Visit  Medication Sig Dispense Refill   amLODipine (NORVASC) 5 MG tablet Take 1 tablet (5 mg total) by mouth daily. 90 tablet 3   fenofibrate (TRICOR) 145 MG tablet Take 1 tablet (145 mg total) by mouth daily. 90 tablet 3   Flaxseed Oil OIL by Does not apply route.     metoprolol succinate (TOPROL-XL) 100 MG 24 hr tablet Take 1 tablet (100 mg total) by mouth daily. Take with or immediately following a meal. 90 tablet 3   olmesartan (BENICAR) 40 MG tablet Take 1 tablet (40 mg total) by mouth daily. 90 tablet 3   pantoprazole (PROTONIX) 20 MG tablet Take 1 tablet (20 mg total) by mouth daily as needed. 90 tablet 3   traZODone (DESYREL) 150 MG tablet TAKE 1 TABLET BY MOUTH AT BEDTIME. 90 tablet 1   No facility-administered medications prior to visit.    Allergies  Allergen Reactions   Penicillins    Thiazide-Type Diuretics Other (See Comments)    SEVERE electrolyte disturbance (potassium 2.2 and sodium 114).      Review of Systems REFER TO HPI FOR  PERTINENT POSITIVES AND NEGATIVES     Objective:    Physical Exam Musculoskeletal:     Left shoulder: Tenderness (some ttp along delotoid and biceps) and crepitus present. No swelling, deformity or bony tenderness. Normal strength. Normal pulse.     Comments: Full ROM left shoulder but she does note some pain with abduction    BP (!) 148/90   Pulse 60   Temp (!) 97 F (36.1 C)   Ht _0  (1.676 m)   Wt 230 lb (104.3 kg)   LMP 07/06/2008 (Approximate)   SpO2 97%   BMI 37.12 kg/m  Wt Readings from Last 3 Encounters:  03/19/21 230 lb (104.3 kg)  09/07/20 232 lb 12.8 oz (105.6 kg)  08/16/19 223 lb (101.2 kg)    Health Maintenance Due  Topic Date Due   Pneumococcal Vaccine 64-56 Years old (1 - PCV) Never done   INFLUENZA VACCINE  02/08/2021    There are no preventive care reminders to display for this patient.   Lab Results  Component Value Date   TSH 0.98 09/07/2020   Lab Results  Component Value Date   WBC 10.4 09/07/2020   HGB 14.0 09/07/2020   HCT 41.7 09/07/2020   MCV 82.2 09/07/2020   PLT 294.0 09/07/2020   Lab Results  Component Value Date   NA 136 09/07/2020   K 3.8 09/07/2020   CHLORIDE 103 08/16/2016   CO2 28 09/07/2020   GLUCOSE 94 09/07/2020   BUN 18 09/07/2020   CREATININE 0.78 09/07/2020   BILITOT 0.3 09/07/2020   ALKPHOS 71 09/07/2020   AST 12 09/07/2020   ALT 10 09/07/2020   PROT 6.6 09/07/2020   ALBUMIN 4.1 09/07/2020   CALCIUM 8.7 09/07/2020   ANIONGAP 10 08/16/2016   EGFR 88 (L) 08/16/2016   GFR 80.66 09/07/2020   Lab Results  Component Value Date   CHOL 199 09/07/2020   Lab Results  Component Value Date   HDL 54.10 09/07/2020   Lab Results  Component Value Date   LDLCALC 128 (H) 01/19/2018   Lab Results  Component Value Date   TRIG (H) 09/07/2020    433.0 Triglyceride is over 400; calculations on Lipids are invalid.   Lab Results  Component Value Date   CHOLHDL 4  09/07/2020   No results found for: HGBA1C      Assessment & Plan:   Problem List Items Addressed This Visit   None Visit Diagnoses     Acute pain of left shoulder    -  Primary   Relevant Orders   DG Shoulder Left   Influenza vaccination administered at current visit            Meds ordered this encounter  Medications   methylPREDNISolone (MEDROL DOSEPAK) 4 MG TBPK tablet    Sig: Please take per packaging instructions.    Dispense:  21 tablet    Refill:  0   1. Acute pain of left shoulder Likely strain/ some rotator involvement. Will treat conservatively at this time with Medrol Dosepak, Biofreeze, ice and gentle stretches.  Will obtain x-ray for further investigation.  She may benefit from physical therapy as well.    2. Influenza vaccination administered at current visit Flu shot administered at this visit.   Abbigael Detlefsen M Gurney Balthazor, PA-C

## 2021-04-06 ENCOUNTER — Ambulatory Visit (INDEPENDENT_AMBULATORY_CARE_PROVIDER_SITE_OTHER)
Admission: RE | Admit: 2021-04-06 | Discharge: 2021-04-06 | Disposition: A | Discharge status: Home / Self Care | Payer: 59 | Source: Ambulatory Visit | Attending: Physician Assistant | Admitting: Physician Assistant

## 2021-04-06 ENCOUNTER — Other Ambulatory Visit: Payer: Self-pay

## 2021-04-06 DIAGNOSIS — M25512 Pain in left shoulder: Secondary | ICD-10-CM

## 2021-04-15 ENCOUNTER — Ambulatory Visit (INDEPENDENT_AMBULATORY_CARE_PROVIDER_SITE_OTHER): Payer: 59 | Admitting: Family Medicine

## 2021-04-15 ENCOUNTER — Other Ambulatory Visit: Payer: Self-pay

## 2021-04-15 ENCOUNTER — Encounter: Payer: Self-pay | Admitting: Family Medicine

## 2021-04-15 VITALS — BP 128/80 | HR 62 | Temp 97.6°F | Ht 66.0 in | Wt 230.0 lb

## 2021-04-15 DIAGNOSIS — E781 Pure hyperglyceridemia: Secondary | ICD-10-CM

## 2021-04-15 DIAGNOSIS — I10 Essential (primary) hypertension: Secondary | ICD-10-CM | POA: Diagnosis not present

## 2021-04-15 DIAGNOSIS — F5101 Primary insomnia: Secondary | ICD-10-CM | POA: Diagnosis not present

## 2021-04-15 DIAGNOSIS — Z6837 Body mass index (BMI) 37.0-37.9, adult: Secondary | ICD-10-CM

## 2021-04-15 LAB — COMPREHENSIVE METABOLIC PANEL
ALT: 13 U/L (ref 0–35)
AST: 19 U/L (ref 0–37)
Albumin: 4.3 g/dL (ref 3.5–5.2)
Alkaline Phosphatase: 53 U/L (ref 39–117)
BUN: 13 mg/dL (ref 6–23)
CO2: 29 mEq/L (ref 19–32)
Calcium: 9.2 mg/dL (ref 8.4–10.5)
Chloride: 103 mEq/L (ref 96–112)
Creatinine, Ser: 0.81 mg/dL (ref 0.40–1.20)
GFR: 76.76 mL/min (ref >60.00)
Glucose, Bld: 85 mg/dL (ref 70–99)
Potassium: 3.9 mEq/L (ref 3.5–5.1)
Sodium: 140 mEq/L (ref 135–145)
Total Bilirubin: 0.4 mg/dL (ref 0.2–1.2)
Total Protein: 6.7 g/dL (ref 6.0–8.3)

## 2021-04-15 LAB — LIPID PANEL
Cholesterol: 191 mg/dL (ref 0–200)
HDL: 71.2 mg/dL (ref >39.00)
LDL Cholesterol: 102 mg/dL — ABNORMAL HIGH (ref 0–99)
NonHDL: 119.67
Total CHOL/HDL Ratio: 3
Triglycerides: 87 mg/dL (ref 0.0–149.0)
VLDL: 17.4 mg/dL (ref 0.0–40.0)

## 2021-04-15 MED ORDER — TRAZODONE HCL 100 MG PO TABS: 150.0000 mg | ORAL_TABLET | Freq: Every day | ORAL | 3 refills | Status: DC

## 2021-04-15 NOTE — Progress Notes (Signed)
Subjective  CC:  Chief Complaint  Patient presents with   Hypertension    HPI: Sarah Palmer is a 64 y.o. female who presents to the office today to address the problems listed above in the chief complaint. Hypertension and obesity f/u: Control is good . Pt reports she is doing well. taking medications as instructed, no medication side effects noted, no TIAs, no chest pain on exertion, no dyspnea on exertion, no swelling of ankles. Walking more for exercise and working on weight loss.has decreased sweets; eats pimento cheese sandwiches a lot for lunch... needs to be quick. Still likes ice cream. She denies adverse effects from his BP medications. Compliance with medication is good.  Hypertrigs: started fenofibrate in feb and tolerating well. Eating less fats overall.  Insomnia: slightly worsening. Using extra 1/2 of trazadone twice weekly because will awaken (with worry or thoughts about work etc) and can't get back to sleep. Has had a difficult year: father passed in march, had to help mother transition and move, lost a dog and it will be 3 years in November that she lost her husband. 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 coping well overall. Just sleep seems to be affected.   Assessment  1. Essential hypertension   2. Hypertriglyceridemia   3. Primary insomnia   4. Class 2 severe obesity due to excess calories with serious comorbidity and body mass index (BMI) of 37.0 to 37.9 in adult [facility])      Plan   Hypertension f/u: BP control is well controlled. Continue amlodipine, omesartan and Toprol Xl at doses listed below.  Hyperlipidemia f/u: trigs elevated. Discussed better dietary choices and eating on the run. Limit cheeses and ice cream. Continue walking. Recheck fasting lipids on fenofibrate.  Obesity: as above. Working on diet changes Insomnia: increase trazadone 200mg  nightly shortterm. Educated on improving sleep hygiene and stress mgt.   Education regarding  management of these chronic disease states was given. Management strategies discussed on successive visits include dietary and exercise recommendations, goals of achieving and maintaining IBW, and lifestyle modifications aiming for adequate sleep and minimizing stressors.   Follow up: feb or march 2023 for cpe  Orders Placed This Encounter  Procedures   Comprehensive metabolic panel   Lipid panel    Meds ordered this encounter  Medications   traZODone (DESYREL) 100 MG tablet    Sig: Take 1.5-2 tablets (150-200 mg total) by mouth at bedtime.    Dispense:  180 tablet    Refill:  3    Increasing dose so new RX.       BP Readings from Last 3 Encounters:  04/15/21 128/80  03/19/21 (!) 148/90  09/07/20 136/78   Wt Readings from Last 3 Encounters:  04/15/21 230 lb (104.3 kg)  03/19/21 230 lb (104.3 kg)  09/07/20 232 lb 12.8 oz (105.6 kg)    Lab Results  Component Value Date   CHOL 199 09/07/2020   CHOL 203 (H) 01/23/2019   CHOL 212 (H) 01/19/2018   Lab Results  Component Value Date   HDL 54.10 09/07/2020   HDL 56.40 01/23/2019   HDL 55.60 01/19/2018   Lab Results  Component Value Date   LDLCALC 128 (H) 01/19/2018   LDLCALC 111 (H) 01/10/2017   LDLCALC (H) 11/02/2007    144        Total Cholesterol/HDL:CHD Risk Coronary Heart Disease Risk Table  Men   Women  1/2 Average Risk   3.4   3.3   Lab Results  Component Value Date   TRIG (H) 09/07/2020    433.0 Triglyceride is over 400; calculations on Lipids are invalid.   TRIG 316.0 (H) 01/23/2019   TRIG 146.0 01/19/2018   Lab Results  Component Value Date   CHOLHDL 4 09/07/2020   CHOLHDL 4 01/23/2019   CHOLHDL 4 01/19/2018   Lab Results  Component Value Date   LDLDIRECT 120.0 09/07/2020   LDLDIRECT 127.0 01/23/2019   Lab Results  Component Value Date   CREATININE 0.78 09/07/2020   BUN 18 09/07/2020   NA 136 09/07/2020   K 3.8 09/07/2020   CL 101 09/07/2020   CO2 28 09/07/2020     The 10-year ASCVD risk score (Arnett DK, et al., 2019) is: 6.7%   Values used to calculate the score:     Age: 55 years     Sex: Female     Is Non-Hispanic African American: No     Diabetic: No     Tobacco smoker: No     Systolic Blood Pressure: 716 mmHg     Is BP treated: Yes     HDL Cholesterol: 54.1 mg/dL     Total Cholesterol: 199 mg/dL  I reviewed the patients updated PMH, FH, and SocHx.    Patient Active Problem List   Diagnosis Date Noted   Insomnia 08/12/2016    Priority: 1.   Essential hypertension     Priority: 1.   History of melanoma     Priority: 1.   Leukocytosis 07/06/2018    Priority: 2.   Class 2 obesity with body mass index (BMI) of 37.0 to 37.9 in adult 01/10/2017    Priority: 2.   Flushing 09/01/2016    Priority: 2.   Gastroesophageal reflux disease 08/13/2016    Priority: 2.   History of alcohol abuse     Priority: 2.   Atypical chest pain     Priority: 3.   Hypertriglyceridemia 04/15/2021   Screening for colorectal cancer 12/12/2018    Allergies: Penicillins and Thiazide-type diuretics  Social History: Patient  reports that she quit smoking about 7 years ago. Her smoking use included cigarettes. She has a 10.00 pack-year smoking history. She has never used smokeless tobacco. She reports that she does not drink alcohol and does not use drugs.  Current Meds  Medication Sig   amLODipine (NORVASC) 5 MG tablet Take 1 tablet (5 mg total) by mouth daily.   fenofibrate (TRICOR) 145 MG tablet Take 1 tablet (145 mg total) by mouth daily.   Flaxseed Oil OIL by Does not apply route.   metoprolol succinate (TOPROL-XL) 100 MG 24 hr tablet Take 1 tablet (100 mg total) by mouth daily. Take with or immediately following a meal.   olmesartan (BENICAR) 40 MG tablet Take 1 tablet (40 mg total) by mouth daily.   pantoprazole (PROTONIX) 20 MG tablet Take 1 tablet (20 mg total) by mouth daily as needed.   [DISCONTINUED] traZODone (DESYREL) 150 MG tablet TAKE 1  TABLET BY MOUTH AT BEDTIME.    Review of Systems: Cardiovascular: negative for chest pain, palpitations, leg swelling, orthopnea Respiratory: negative for SOB, wheezing or persistent cough Gastrointestinal: negative for abdominal pain Genitourinary: negative for dysuria or gross hematuria  Objective  Vitals: BP 128/80   Pulse 62   Temp 97.6 F (36.4 C) (Temporal)   Ht 5\' 6"  (1.676 m)   Wt 230  lb (104.3 kg)   LMP 07/06/2008 (Approximate)   SpO2 96%   BMI 37.12 kg/m  General: no acute distress  Psych:  Alert and oriented, normal mood and affect HEENT:  Normocephalic, atraumatic, supple neck  Cardiovascular:  RRR without murmur. no edema Respiratory:  Good breath sounds bilaterally, CTAB with normal respiratory effort Skin:  Warm, no rashes Neurologic:   Mental status is normal Commons side effects, risks, benefits, and alternatives for medications and treatment plan prescribed today were discussed, and the patient expressed understanding of the given instructions. Patient is instructed to call or message via MyChart if he/she has any questions or concerns regarding our treatment plan. No barriers to understanding were identified. We discussed Red Flag symptoms and signs in detail. Patient expressed understanding regarding what to do in case of urgent or emergency type symptoms.  Medication list was reconciled, printed and provided to the patient in AVS. Patient instructions and summary information was reviewed with the patient as documented in the AVS. This note was prepared with assistance of Dragon voice recognition software. Occasional wrong-word or sound-a-like substitutions may have occurred due to the inherent limitations of voice recognition software  This visit occurred during the SARS-CoV-2 public health emergency.  Safety protocols were in place, including screening questions prior to the visit, additional usage of staff PPE, and extensive cleaning of exam room while observing  appropriate contact time as indicated for disinfecting solutions.

## 2021-04-15 NOTE — Patient Instructions (Signed)
Please return in February or March 2023 for your annual complete physical; please come fasting.   I will release your lab results to you on your MyChart account with further instructions. Please reply with any questions.    If you have any questions or concerns, please don't hesitate to send me a message via MyChart or call the office at [phone]. Thank you for visiting with Sarah Palmer today! It's our pleasure caring for you.

## 2021-08-24 ENCOUNTER — Other Ambulatory Visit: Payer: Self-pay | Admitting: Family Medicine

## 2021-09-07 ENCOUNTER — Other Ambulatory Visit: Payer: Self-pay | Admitting: Family Medicine

## 2021-09-16 ENCOUNTER — Other Ambulatory Visit: Payer: Self-pay | Admitting: Family Medicine

## 2021-09-16 DIAGNOSIS — I10 Essential (primary) hypertension: Secondary | ICD-10-CM

## 2021-10-31 ENCOUNTER — Other Ambulatory Visit: Payer: Self-pay | Admitting: Family Medicine

## 2021-10-31 DIAGNOSIS — K219 Gastro-esophageal reflux disease without esophagitis: Secondary | ICD-10-CM

## 2021-11-11 ENCOUNTER — Encounter: Payer: Self-pay | Admitting: Family Medicine

## 2021-11-11 ENCOUNTER — Ambulatory Visit (INDEPENDENT_AMBULATORY_CARE_PROVIDER_SITE_OTHER): Payer: 59 | Admitting: Family Medicine

## 2021-11-11 ENCOUNTER — Other Ambulatory Visit (HOSPITAL_COMMUNITY)
Admission: RE | Admit: 2021-11-11 | Discharge: 2021-11-11 | Disposition: A | Discharge status: Home / Self Care | Payer: 59 | Source: Ambulatory Visit | Attending: Family Medicine | Admitting: Family Medicine

## 2021-11-11 VITALS — BP 120/70 | HR 63 | Temp 98.6°F | Ht 66.0 in | Wt 233.4 lb

## 2021-11-11 DIAGNOSIS — E781 Pure hyperglyceridemia: Secondary | ICD-10-CM | POA: Diagnosis not present

## 2021-11-11 DIAGNOSIS — K219 Gastro-esophageal reflux disease without esophagitis: Secondary | ICD-10-CM

## 2021-11-11 DIAGNOSIS — I10 Essential (primary) hypertension: Secondary | ICD-10-CM

## 2021-11-11 DIAGNOSIS — F5101 Primary insomnia: Secondary | ICD-10-CM

## 2021-11-11 DIAGNOSIS — D72829 Elevated white blood cell count, unspecified: Secondary | ICD-10-CM

## 2021-11-11 DIAGNOSIS — Z124 Encounter for screening for malignant neoplasm of cervix: Secondary | ICD-10-CM | POA: Insufficient documentation

## 2021-11-11 DIAGNOSIS — Z0000 Encounter for general adult medical examination without abnormal findings: Secondary | ICD-10-CM | POA: Diagnosis not present

## 2021-11-11 DIAGNOSIS — Z6837 Body mass index (BMI) 37.0-37.9, adult: Secondary | ICD-10-CM

## 2021-11-11 LAB — COMPREHENSIVE METABOLIC PANEL
ALT: 12 U/L (ref 0–35)
AST: 17 U/L (ref 0–37)
Albumin: 4.2 g/dL (ref 3.5–5.2)
Alkaline Phosphatase: 61 U/L (ref 39–117)
BUN: 13 mg/dL (ref 6–23)
CO2: 29 mEq/L (ref 19–32)
Calcium: 9 mg/dL (ref 8.4–10.5)
Chloride: 101 mEq/L (ref 96–112)
Creatinine, Ser: 0.92 mg/dL (ref 0.40–1.20)
GFR: 65.62 mL/min (ref >60.00)
Glucose, Bld: 96 mg/dL (ref 70–99)
Potassium: 4 mEq/L (ref 3.5–5.1)
Sodium: 140 mEq/L (ref 135–145)
Total Bilirubin: 0.3 mg/dL (ref 0.2–1.2)
Total Protein: 6.7 g/dL (ref 6.0–8.3)

## 2021-11-11 LAB — LIPID PANEL
Cholesterol: 192 mg/dL (ref 0–200)
HDL: 72.8 mg/dL (ref >39.00)
LDL Cholesterol: 100 mg/dL — ABNORMAL HIGH (ref 0–99)
NonHDL: 119.5
Total CHOL/HDL Ratio: 3
Triglycerides: 96 mg/dL (ref 0.0–149.0)
VLDL: 19.2 mg/dL (ref 0.0–40.0)

## 2021-11-11 LAB — CBC WITH DIFFERENTIAL/PLATELET
Basophils Absolute: 0.1 10*3/uL (ref 0.0–0.1)
Basophils Relative: 1 % (ref 0.0–3.0)
Eosinophils Absolute: 0.4 10*3/uL (ref 0.0–0.7)
Eosinophils Relative: 4.1 % (ref 0.0–5.0)
HCT: 41.5 % (ref 36.0–46.0)
Hemoglobin: 13.5 g/dL (ref 12.0–15.0)
Lymphocytes Relative: 29.2 % (ref 12.0–46.0)
Lymphs Abs: 2.9 10*3/uL (ref 0.7–4.0)
MCHC: 32.6 g/dL (ref 30.0–36.0)
MCV: 83.4 fl (ref 78.0–100.0)
Monocytes Absolute: 0.9 10*3/uL (ref 0.1–1.0)
Monocytes Relative: 9 % (ref 3.0–12.0)
Neutro Abs: 5.5 10*3/uL (ref 1.4–7.7)
Neutrophils Relative %: 56.7 % (ref 43.0–77.0)
Platelets: 372 10*3/uL (ref 150.0–400.0)
RBC: 4.97 Mil/uL (ref 3.87–5.11)
RDW: 15.1 % (ref 11.5–15.5)
WBC: 9.8 10*3/uL (ref 4.0–10.5)

## 2021-11-11 MED ORDER — PANTOPRAZOLE SODIUM 20 MG PO TBEC: 20.0000 mg | DELAYED_RELEASE_TABLET | Freq: Every day | ORAL | 3 refills | Status: DC | PRN

## 2021-11-11 NOTE — Progress Notes (Signed)
?Subjective  ?Chief Complaint  ?Patient presents with  ? Annual Exam  ?  Pt here for Annual Exam and is currently fasting.  ? ? ?HPI: Sarah Palmer is a 66 y.o. female who presents to [place] [facility] [place] today for a Female Wellness Visit. She also has the concerns and/or needs as listed above in the chief complaint. These will be addressed in addition to the Health Maintenance Visit.  ? ?Wellness Visit: annual visit with health maintenance review and exam with Pap ? ?Health maintenance: Pap smear due today.  Had normal Pap in 2020 but transformation zone was not visible.  Mammogram due in June.  Colorectal cancer screening up-to-date.  Immunizations are current.  Overall doing well.  Now working from home.  Working on weight loss.  Ordered [employer].  Trying to walk more ?Chronic disease f/u and/or acute problem visit: (deemed necessary to be done in addition to the wellness visit): ?Hypertension: Feeling well. Taking 3 medications w/o adverse effects. No symptoms of CHF, angina; no palpitations, sob, cp or lower extremity edema. Compliant with meds.  Gets blood pressure check at dentist office every 2 weeks recently because she is getting implants done.  Averages 120s over 70s there. ?Hypertriglyceridemia on fenofibrate tolerating well.  Compliant with medication.  Working on [employer]. ?Chronic leukocytosis, benign. ?Primary insomnia: Continues to do well on trazodone nightly.  No adverse effects. ? ?Assessment  ?1. Annual physical exam   ?2. Cervical cancer screening   ?3. Essential hypertension   ?4. Hypertriglyceridemia   ?5. Leukocytosis, unspecified type   ?6. Primary insomnia   ?7. Class 2 severe obesity due to excess calories with serious comorbidity and body mass index (BMI) of 37.0 to 37.9 in adult [facility]) Chronic  ?8. Gastroesophageal reflux disease without esophagitis   ? ?  ?Plan  ?Female Wellness Visit: ?Age appropriate Health Maintenance and Prevention measures were discussed  with patient. Included topics are cancer screening recommendations, ways to keep healthy (see AVS) including dietary and exercise recommendations, regular eye and dental care, use of seat belts, and avoidance of moderate alcohol use and tobacco use.  Pap smear with high-risk HPV testing today.  Screens are current ?BMI: discussed patient's BMI and encouraged positive lifestyle modifications to help get to or maintain a target BMI. ?HM needs and immunizations were addressed and ordered. See below for orders. See HM and immunization section for updates. ?Routine labs and screening tests ordered including cmp, cbc and lipids where appropriate. ?Discussed recommendations regarding Vit D and calcium supplementation (see AVS) ? ?Chronic disease management visit and/or acute problem visit: ?Hypertension is well controlled on amlodipine 5 mg daily, Benicar 40 mg daily, Toprol-XL 100 mg daily.  Check renal function electrolytes today. ?We will recheck fasting lipids today on fenofibrate for hypertriglyceridemia. ?Obesity: Praised for trying to lose weight, eat better and exercise ?Continue trazodone nightly for insomnia 50 mg. ?GERD is very well controlled on Protonix 20 mg daily.  Refilled today. ? ?Follow up: Return in about 6 months (around 05/14/2022) for follow up Hypertension.  ?Orders Placed This Encounter  ?Procedures  ? CBC with Differential/Platelet  ? Comp Met (CMET)  ? Lipid Profile  ? ?Meds ordered this encounter  ?Medications  ? pantoprazole (PROTONIX) 20 MG tablet  ?  Sig: Take 1 tablet (20 mg total) by mouth daily as needed.  ?  Dispense:  90 tablet  ?  Refill:  3  ? ?  ? ?Body mass index is 37.67 kg/m?Marland Kitchen ?  Wt Readings from Last 3 Encounters:  ?11/11/21 233 lb 6.4 oz (105.9 kg)  ?04/15/21 230 lb (104.3 kg)  ?03/19/21 230 lb (104.3 kg)  ? ? ? ?Patient Active Problem List  ? Diagnosis Date Noted  ? Insomnia 08/12/2016  ?  Priority: High  ? Essential hypertension   ?  Priority: High  ? History of melanoma   ?   Priority: High  ?  resected from lower extremity ? ?  ? Leukocytosis 07/06/2018  ?  Priority: Medium   ?  Negative eval by hematology 2018 ? ?  ? Class 2 severe obesity due to excess calories with serious comorbidity and body mass index (BMI) of 37.0 to 37.9 in adult [facility])   ?  Priority: Medium   ? [place] 09/01/2016  ?  Priority: Medium   ?  Evaluated by Dr. Loanne Drilling 2018 for pheochromocytoma; urine and initial lab tests were normal. To repeat testing if symptoms recur.  ? ?  ? Gastroesophageal reflux disease 08/13/2016  ?  Priority: Medium   ? History of alcohol abuse   ?  Priority: Medium   ?  Patient reports being in recovery for years and associated with a formal treatment program. ? ?  ? Atypical chest pain   ?  Priority: Low  ?  +Palpitations; negative chest CT in 2009; associated with URI in 06/2012; CT reviewed with radiologist-minimal, if any, coronary/aortic calcification. ? ?  ? Hypertriglyceridemia 04/15/2021  ? Screening for colorectal cancer 12/12/2018  ?  + cologuard with normal f/u colonoscopy 07/2018. Repeat in 10 years for routine screening. No more cologuards since 1st was abnormal ? ?  ? ?Health Maintenance  ?Topic Date Due  ? MAMMOGRAM  01/07/2022  ? PAP SMEAR-Modifier  01/22/2022  ? INFLUENZA VACCINE  02/08/2022  ? TETANUS/TDAP  07/11/2025  ? COLONOSCOPY (Pts 45-66yr Insurance coverage will need to be confirmed)  07/25/2028  ? COVID-19 Vaccine  Completed  ? Hepatitis C Screening  Completed  ? Zoster Vaccines- Shingrix  Completed  ? HPV VACCINES  Aged Out  ? HIV Screening  Discontinued  ? ?Immunization History  ?Administered Date(s) Administered  ? Influenza,inj,Quad PF,6+ Mos 07/06/2018, 05/29/2019, 05/14/2020, 03/19/2021  ? Moderna Covid-19 Vaccine Bivalent Booster 153yr& up 06/06/2021  ? Moderna Sars-Covid-2 Vaccination 09/19/2019, 10/10/2019, 05/14/2020  ? Zoster Recombinat (Shingrix) 02/23/2019, 06/15/2019  ? ?We updated and reviewed the patient's past history in detail and it is  documented below. ?Allergies: ?Patient is allergic to penicillins and thiazide-type diuretics. ?Past Medical History ?Patient  has a past medical history of Acid reflux, Anxiety, Chest pain (2009), History of alcohol abuse (2009), History of seizure - electrolyte abnormality (09/26/2013), Hyperlipidemia, Hypertension, Insomnia, Leukocytosis (07/06/2018), Melanoma (HCHalaula(1999), and Tobacco abuse. ?Past Surgical History ?Patient  has a past surgical history that includes Melanoma excision (1999) and Middle ear surgery (2017). ?Family History: ?Patient family history includes COPD in her father; Colon polyps in her mother; Healthy in her mother, sister, and sister; Hypertension in an other family member; Obesity in her sister. ?Social History:  ?Patient  reports that she quit smoking about 8 years ago. Her smoking use included cigarettes. She has a 10.00 pack-year smoking history. She has never used smokeless tobacco. She reports that she does not drink alcohol and does not use drugs. ? ?Review of Systems: ?Constitutional: negative for fever or malaise ?Ophthalmic: negative for photophobia, double vision or loss of vision ?Cardiovascular: negative for chest pain, dyspnea on exertion, or new LE swelling ?Respiratory:  negative for SOB or persistent cough ?Gastrointestinal: negative for abdominal pain, change in bowel habits or melena ?Genitourinary: negative for dysuria or gross hematuria, no abnormal uterine bleeding or disharge ?Musculoskeletal: negative for new gait disturbance or muscular weakness ?Integumentary: negative for new or persistent rashes, no breast lumps ?Neurological: negative for TIA or stroke symptoms ?Psychiatric: negative for SI or delusions ?Allergic/Immunologic: negative for hives ? ?Patient Care Team  ?  Relationship Specialty Notifications Start End  ?Leamon Arnt, MD PCP - General Family Medicine  07/06/18   ?Yehuda Savannah, MD (Inactive) Attending Physician Cardiology  07/06/12    ?Mauri Pole, MD Consulting Physician Gastroenterology  01/23/19   ? ? ?Objective  ?Vitals: BP 120/70 Comment: At dentist office  Pulse 63   Temp 98.6 ?F (37 ?C)   Ht 5' 6" (1.676 m)   Wt 233 lb 6.4 oz (105.9 kg)

## 2021-11-11 NOTE — Patient Instructions (Signed)
Please return in 6 months for hypertension follow up.  ? ?I will release your lab results to you on your MyChart account with further instructions. You may see the results before I do, but when I review them I will send you a message with my report or have my assistant call you if things need to be discussed. Please reply to my message with any questions. Thank you!  ? ?If you have any questions or concerns, please don't hesitate to send me a message via MyChart or call the office at [phone]. Thank you for visiting with [place] today! It's our pleasure caring for you.  ? ?Preventive Care 64 Years and Older, Female ?Preventive care refers to lifestyle choices and visits with your health care provider that can promote health and wellness. Preventive care visits are also called wellness exams. ?What can I expect for my preventive care visit? ?Counseling ?Your health care provider may ask you questions about your: ?Medical history, including: ?Past medical problems. ?Family medical history. ?Pregnancy and menstrual history. ?History of falls. ?Current health, including: ?Memory and ability to understand (cognition). ?Emotional well-being. ?Home life and relationship well-being. ?Sexual activity and sexual health. ?Lifestyle, including: ?Alcohol, nicotine or tobacco, and drug use. ?Access to firearms. ?Diet, exercise, and sleep habits. ?Work and work Statistician. ?Sunscreen use. ?Safety issues such as seatbelt and bike helmet use. ?Physical exam ?Your health care provider will check your: ?Height and weight. These may be used to calculate your BMI (body mass index). BMI is a measurement that tells if you are at a healthy weight. ?Waist circumference. This measures the distance around your waistline. This measurement also tells if you are at a healthy weight and may help predict your risk of certain diseases, such as type 2 diabetes and high blood pressure. ?Heart rate and blood pressure. ?Body temperature. ?Skin for  abnormal spots. ?What immunizations do I need? ? ?Vaccines are usually given at various ages, according to a schedule. Your health care provider will recommend vaccines for you based on your age, medical history, and lifestyle or other factors, such as travel or where you work. ?What tests do I need? ?Screening ?Your health care provider may recommend screening tests for certain conditions. This may include: ?Lipid and cholesterol levels. ?Hepatitis C test. ?Hepatitis B test. ?HIV (human immunodeficiency virus) test. ?STI (sexually transmitted infection) testing, if you are at risk. ?Lung cancer screening. ?Colorectal cancer screening. ?Diabetes screening. This is done by checking your blood sugar (glucose) after you have not eaten for a while (fasting). ?Mammogram. Talk with your health care provider about how often you should have regular mammograms. ?BRCA-related cancer screening. This may be done if you have a family history of breast, ovarian, tubal, or peritoneal cancers. ?Bone density scan. This is done to screen for osteoporosis. ?Talk with your health care provider about your test results, treatment options, and if necessary, the need for more tests. ?Follow these instructions at home: ?Eating and drinking ? ?Eat a diet that includes fresh fruits and vegetables, whole grains, lean protein, and low-fat dairy products. Limit your intake of foods with high amounts of sugar, saturated fats, and salt. ?Take vitamin and mineral supplements as recommended by your health care provider. ?Do not drink alcohol if your health care provider tells you not to drink. ?If you drink alcohol: ?Limit how much you have to 0-1 drink a day. ?Know how much alcohol is in your drink. In the [place] one drink equals one 12 oz bottle of beer (355  mL), one 5 oz glass of wine (148 mL), or one 1? oz glass of hard liquor (44 mL). ?Lifestyle ?Brush your teeth every morning and night with fluoride toothpaste. Floss one time each  day. ?Exercise for at least 30 minutes 5 or more days each week. ?Do not use any products that contain nicotine or tobacco. These products include cigarettes, chewing tobacco, and vaping devices, such as e-cigarettes. If you need help quitting, ask your health care provider. ?Do not use drugs. ?If you are sexually active, practice safe sex. Use a condom or other form of protection in order to prevent STIs. ?Take aspirin only as told by your health care provider. Make sure that you understand how much to take and what form to take. Work with your health care provider to find out whether it is safe and beneficial for you to take aspirin daily. ?Ask your health care provider if you need to take a cholesterol-lowering medicine (statin). ?Find healthy ways to manage stress, such as: ?Meditation, yoga, or listening to music. ?Journaling. ?Talking to a trusted person. ?Spending time with friends and family. ?Minimize exposure to UV radiation to reduce your risk of skin cancer. ?Safety ?Always wear your seat belt while driving or riding in a vehicle. ?Do not drive: ?If you have been drinking alcohol. Do not ride with someone who has been drinking. ?When you are tired or distracted. ?While texting. ?If you have been using any mind-altering substances or drugs. ?Wear a helmet and other protective equipment during sports activities. ?If you have firearms in your house, make sure you follow all gun safety procedures. ?What's next? ?Visit your health care provider once a year for an annual wellness visit. ?Ask your health care provider how often you should have your eyes and teeth checked. ?Stay up to date on all vaccines. ?This information is not intended to replace advice given to you by your health care provider. Make sure you discuss any questions you have with your health care provider. ?Document Revised: 12/23/2020 Document Reviewed: 12/23/2020 ?Elsevier Patient Education ? [place]. ? ?

## 2021-11-12 LAB — CYTOLOGY - PAP
Adequacy: ABSENT
Comment: NEGATIVE
Diagnosis: NEGATIVE
High risk HPV: NEGATIVE

## 2022-02-03 IMAGING — DX DG SHOULDER 2+V*L*
3 series · 3 of 3 positions shown · non-contrast
Comparison: None.

CLINICAL DATA: Left shoulder pain for 4 weeks. No known injury.
Acute pain of left shoulder. Pain worse with abduction.

EXAM:
LEFT SHOULDER - 2+ VIEW

[grashey]
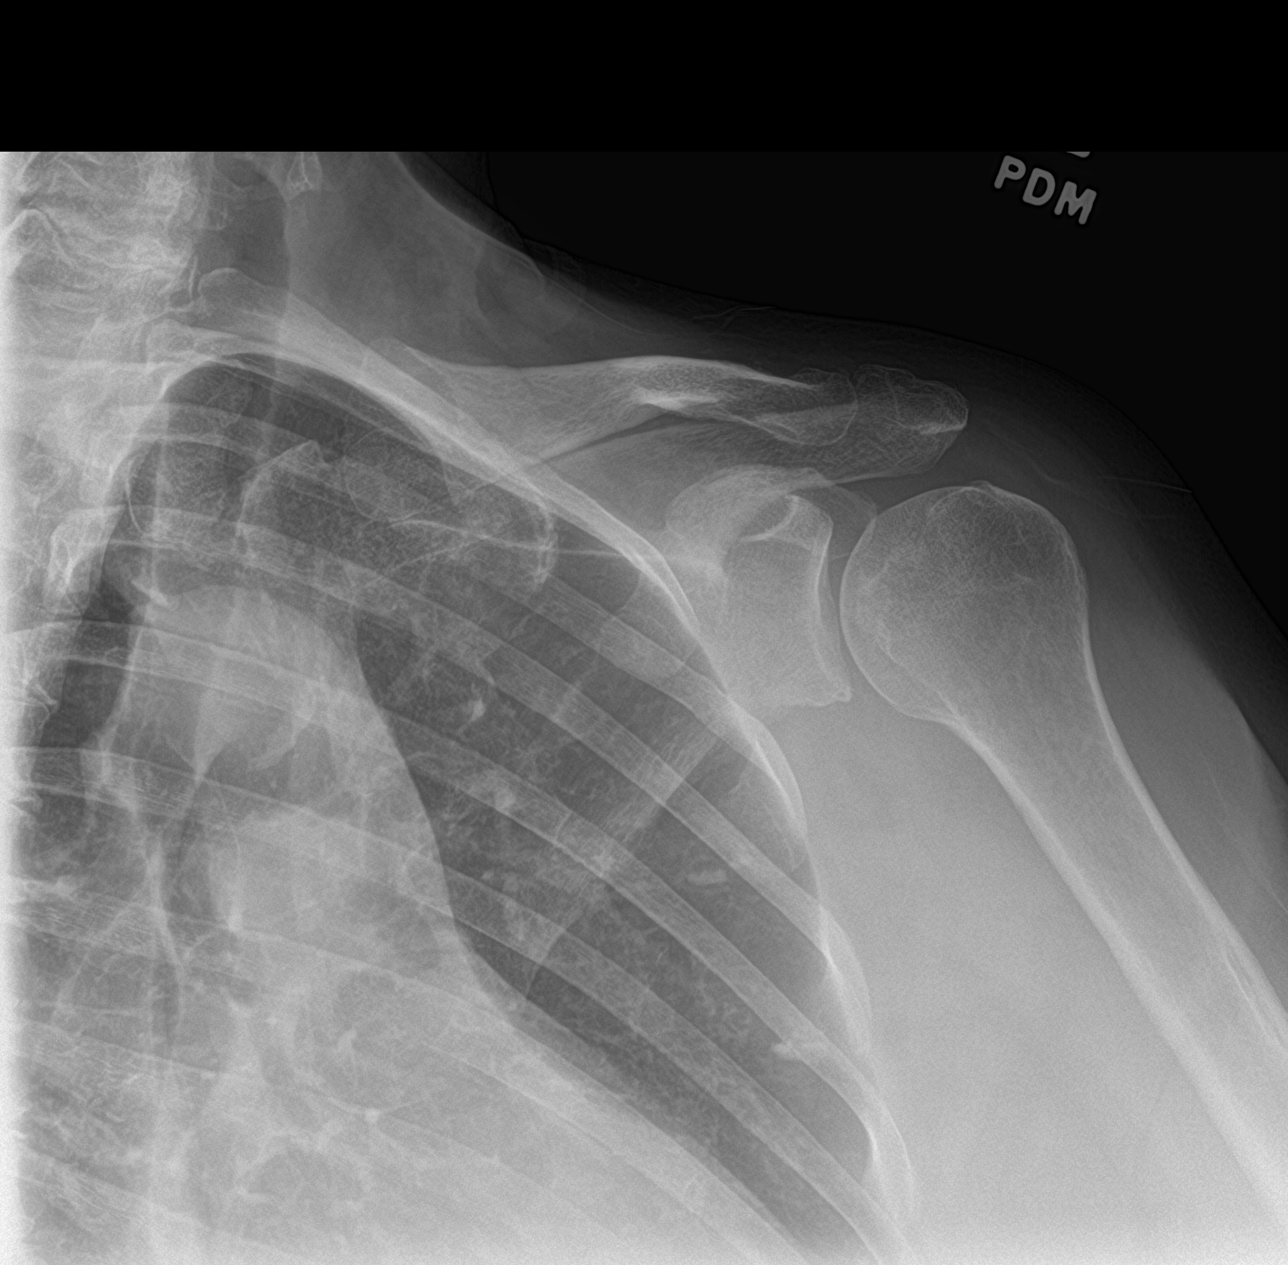

[y view]
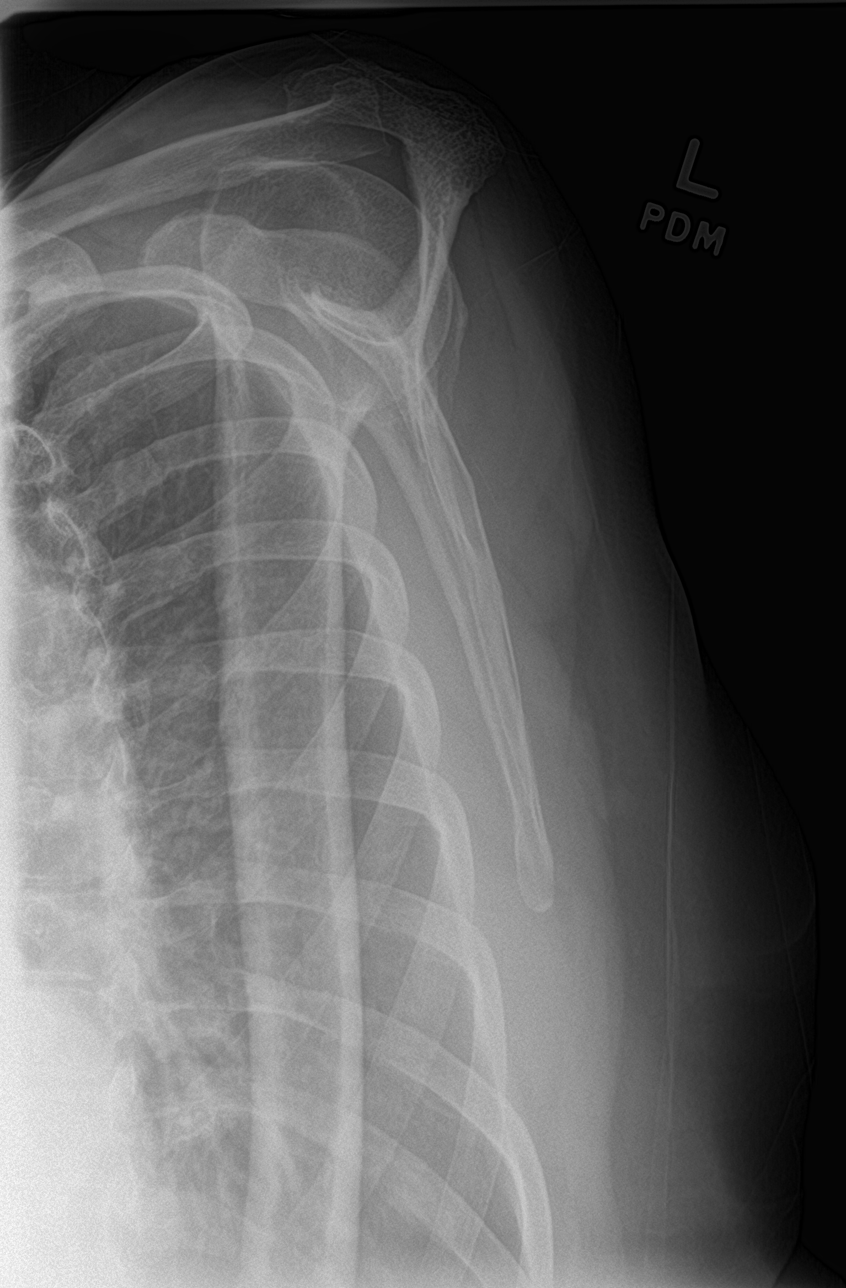

[shoulder axial]
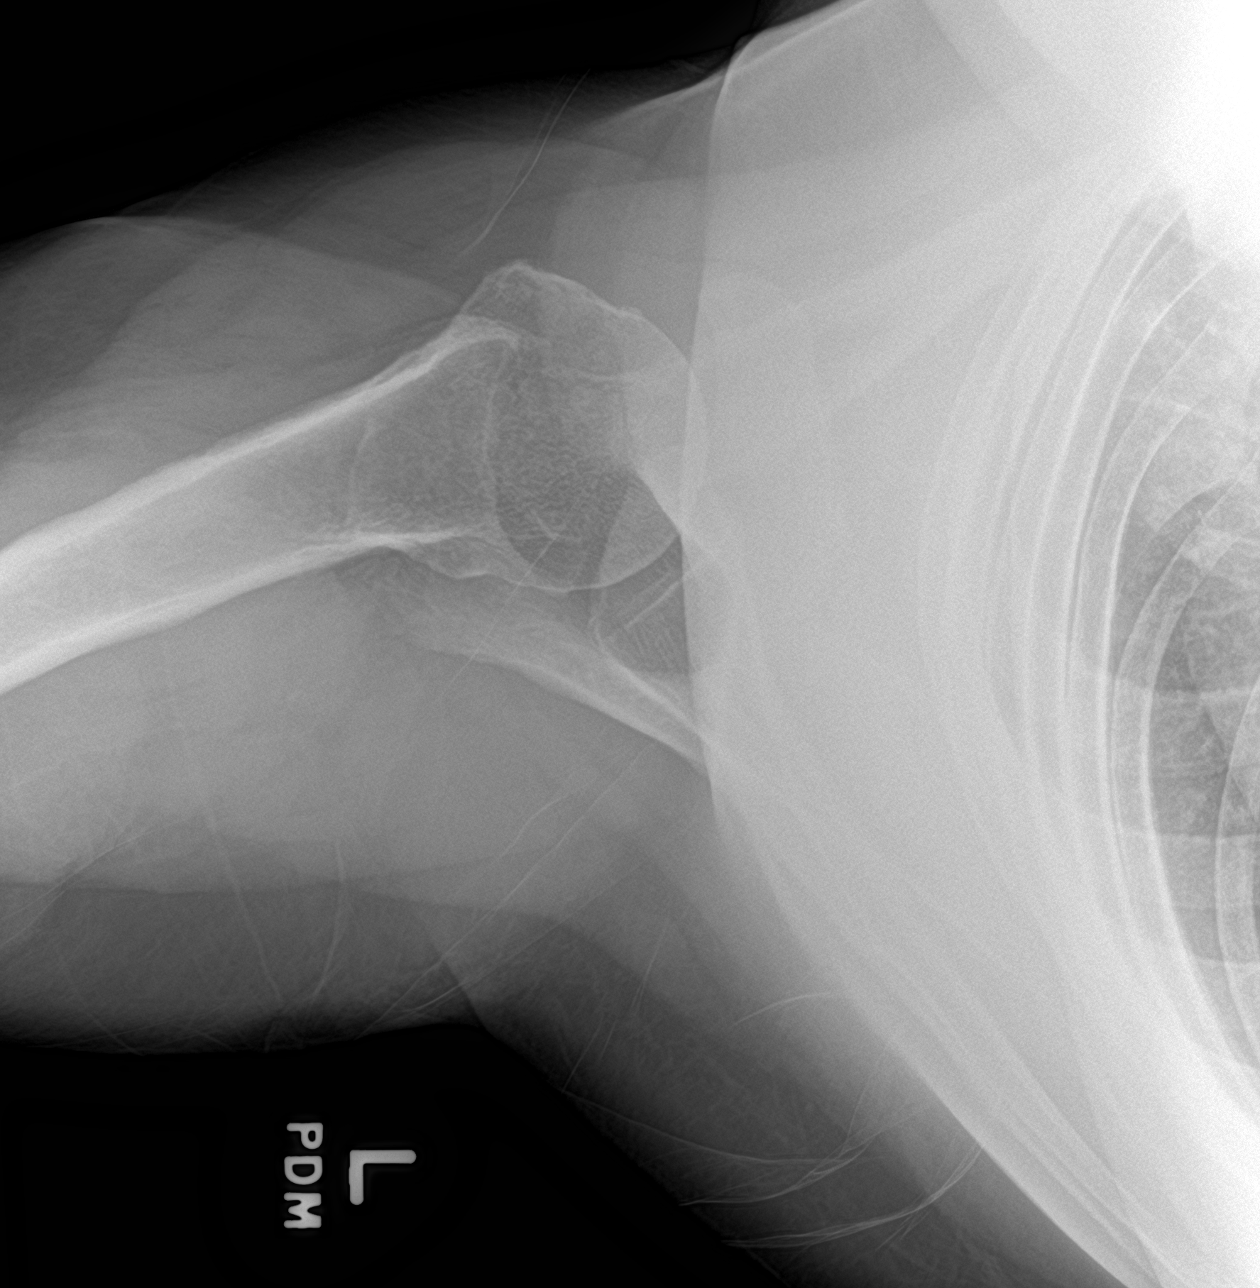

[3 of 3 positions shown; findings below may reference images not displayed]

FINDINGS: There is no evidence of fracture or dislocation. Mild
acromioclavicular and glenohumeral spurring. No erosion. No
avascular necrosis or focal bone lesion. Soft tissues are
unremarkable. No soft tissue calcifications.
IMPRESSION: Mild acromioclavicular and glenohumeral osteoarthritis.

## 2022-03-09 DIAGNOSIS — Z1231 Encounter for screening mammogram for malignant neoplasm of breast: Secondary | ICD-10-CM | POA: Diagnosis not present

## 2022-03-09 LAB — HM MAMMOGRAPHY

## 2022-03-18 ENCOUNTER — Encounter: Payer: Self-pay | Admitting: Family Medicine

## 2022-03-28 ENCOUNTER — Other Ambulatory Visit: Payer: Self-pay | Admitting: Family Medicine

## 2022-04-04 ENCOUNTER — Encounter: Payer: Self-pay | Admitting: *Deleted

## 2022-04-24 ENCOUNTER — Other Ambulatory Visit: Payer: Self-pay | Admitting: Family Medicine

## 2022-05-16 ENCOUNTER — Ambulatory Visit: Payer: 59 | Admitting: Family Medicine

## 2022-05-20 ENCOUNTER — Encounter: Payer: Self-pay | Admitting: Family Medicine

## 2022-05-20 ENCOUNTER — Ambulatory Visit (INDEPENDENT_AMBULATORY_CARE_PROVIDER_SITE_OTHER): Payer: BC Managed Care – PPO | Admitting: Family Medicine

## 2022-05-20 VITALS — BP 156/94 | HR 52 | Temp 97.3°F | Ht 66.0 in | Wt 230.0 lb

## 2022-05-20 DIAGNOSIS — F5101 Primary insomnia: Secondary | ICD-10-CM | POA: Diagnosis not present

## 2022-05-20 DIAGNOSIS — Z23 Encounter for immunization: Secondary | ICD-10-CM | POA: Diagnosis not present

## 2022-05-20 DIAGNOSIS — I10 Essential (primary) hypertension: Secondary | ICD-10-CM | POA: Diagnosis not present

## 2022-05-20 DIAGNOSIS — Z6837 Body mass index (BMI) 37.0-37.9, adult: Secondary | ICD-10-CM

## 2022-05-20 NOTE — Progress Notes (Signed)
Subjective  CC:  Chief Complaint  Patient presents with   Hypertension    HPI: Sarah Palmer is a 66 y.o. female who presents to the office today to address the problems listed above in the chief complaint. Hypertension f/u: Control has been good. Pt reports she is doing well. taking medications as instructed, no medication side effects noted, no TIAs, no chest pain on exertion, no dyspnea on exertion, no swelling of ankles. Home readings once or twice weekly remain normal with avg: 120s/70s-80. She denies adverse effects from his BP medications. Compliance with medication is good. We reviewed her h/o severe hyponatremia and hypokalemia causing seizures 2015: has not used thiazide diuretic since but was only on 12.5 hctz; electrolyte problems were more likely due to vomiting and nutritional.  Insomnia: on high dose trazodone and works well. C/o "strange dreames" recently. No stressors or panic sxs. Mood is good.  Weight is stable.  Eligible for prevnar 20 today. Had covid and flu vaccines.  Assessment  1. Essential hypertension   2. Primary insomnia   3. Class 2 severe obesity due to excess calories with serious comorbidity and body mass index (BMI) of 37.0 to 37.9 in adult [facility])      Plan   Hypertension f/u: BP control is well controlled. By home readings. Will continue home monitoring. If elevates, would consider cautious use of thiazide diuretic or spironolactone. Pt agrees. Continue trazodone for sleep. Monitor dreams.  Continue diet and exercise for weight loss Prevnar given.   Education regarding management of these chronic disease states was given. Management strategies discussed on successive visits include dietary and exercise recommendations, goals of achieving and maintaining IBW, and lifestyle modifications aiming for adequate sleep and minimizing stressors.   Follow up: 6 mo for cpe  No orders of the defined types were placed in this encounter.  No orders of the  defined types were placed in this encounter.     BP Readings from Last 3 Encounters:  05/20/22 (!) 159/78  11/11/21 120/70  04/15/21 128/80   Wt Readings from Last 3 Encounters:  05/20/22 230 lb (104.3 kg)  11/11/21 233 lb 6.4 oz (105.9 kg)  04/15/21 230 lb (104.3 kg)    Lab Results  Component Value Date   CHOL 192 11/11/2021   CHOL 191 04/15/2021   CHOL 199 09/07/2020   Lab Results  Component Value Date   HDL 72.80 11/11/2021   HDL 71.20 04/15/2021   HDL 54.10 09/07/2020   Lab Results  Component Value Date   LDLCALC 100 (H) 11/11/2021   LDLCALC 102 (H) 04/15/2021   LDLCALC 128 (H) 01/19/2018   Lab Results  Component Value Date   TRIG 96.0 11/11/2021   TRIG 87.0 04/15/2021   TRIG (H) 09/07/2020    433.0 Triglyceride is over 400; calculations on Lipids are invalid.   Lab Results  Component Value Date   CHOLHDL 3 11/11/2021   CHOLHDL 3 04/15/2021   CHOLHDL 4 09/07/2020   Lab Results  Component Value Date   LDLDIRECT 120.0 09/07/2020   LDLDIRECT 127.0 01/23/2019   Lab Results  Component Value Date   CREATININE 0.92 11/11/2021   BUN 13 11/11/2021   NA 140 11/11/2021   K 4.0 11/11/2021   CL 101 11/11/2021   CO2 29 11/11/2021    The 10-year ASCVD risk score (Arnett DK, et al., 2019) is: 9.9%   Values used to calculate the score:     Age: 4 years  Sex: Female     Is Non-Hispanic African American: No     Diabetic: No     Tobacco smoker: No     Systolic Blood Pressure: 161 mmHg     Is BP treated: Yes     HDL Cholesterol: 72.8 mg/dL     Total Cholesterol: 192 mg/dL  I reviewed the patients updated PMH, FH, and SocHx.    Patient Active Problem List   Diagnosis Date Noted   Insomnia 08/12/2016    Priority: High   Essential hypertension     Priority: High   History of melanoma     Priority: High   Hypertriglyceridemia 04/15/2021    Priority: Medium    Leukocytosis 07/06/2018    Priority: Medium    Class 2 severe obesity due to excess  calories with serious comorbidity and body mass index (BMI) of 37.0 to 37.9 in adult [facility])     Priority: Medium    Flushing 09/01/2016    Priority: Medium    Gastroesophageal reflux disease 08/13/2016    Priority: Medium    History of alcohol abuse     Priority: Medium    Atypical chest pain     Priority: Low   Screening for colorectal cancer 12/12/2018    Allergies: Penicillins and Thiazide-type diuretics  Social History: Patient  reports that she quit smoking about 8 years ago. Her smoking use included cigarettes. She has a 10.00 pack-year smoking history. She has never used smokeless tobacco. She reports that she does not drink alcohol and does not use drugs.  Current Meds  Medication Sig   amLODipine (NORVASC) 5 MG tablet TAKE 1 TABLET (5 MG TOTAL) BY MOUTH DAILY.   fenofibrate (TRICOR) 145 MG tablet TAKE 1 TABLET BY MOUTH EVERY DAY   Flaxseed Oil OIL by Does not apply route.   metoprolol succinate (TOPROL-XL) 100 MG 24 hr tablet TAKE 1 TABLET BY MOUTH DAILY. TAKE WITH OR IMMEDIATELY FOLLOWING A MEAL.   olmesartan (BENICAR) 40 MG tablet TAKE 1 TABLET BY MOUTH EVERY DAY   pantoprazole (PROTONIX) 20 MG tablet Take 1 tablet (20 mg total) by mouth daily as needed.   traZODone (DESYREL) 100 MG tablet Take 1.5-2 tablets (150-200 mg total) by mouth at bedtime.    Review of Systems: Cardiovascular: negative for chest pain, palpitations, leg swelling, orthopnea Respiratory: negative for SOB, wheezing or persistent cough Gastrointestinal: negative for abdominal pain Genitourinary: negative for dysuria or gross hematuria  Objective  Vitals: BP (!) 159/78   Pulse (!) 52   Temp (!) 97.3 F (36.3 C) (Temporal)   Ht '5\' 6"'$  (1.676 m)   Wt 230 lb (104.3 kg)   LMP 07/06/2008 (Approximate)   SpO2 96%   BMI 37.12 kg/m  General: no acute distress  Psych:  Alert and oriented, normal mood and affect HEENT:  Normocephalic, atraumatic, supple neck  Cardiovascular:  RRR without murmur. no  edema Respiratory:  Good breath sounds bilaterally, CTAB with normal respiratory effort Skin:  Warm, no rashes Neurologic:   Mental status is normal Commons side effects, risks, benefits, and alternatives for medications and treatment plan prescribed today were discussed, and the patient expressed understanding of the given instructions. Patient is instructed to call or message via MyChart if he/she has any questions or concerns regarding our treatment plan. No barriers to understanding were identified. We discussed Red Flag symptoms and signs in detail. Patient expressed understanding regarding what to do in case of urgent or emergency type symptoms.  Medication  list was reconciled, printed and provided to the patient in AVS. Patient instructions and summary information was reviewed with the patient as documented in the AVS. This note was prepared with assistance of Dragon voice recognition software. Occasional wrong-word or sound-a-like substitutions may have occurred due to the inherent limitation

## 2022-05-20 NOTE — Patient Instructions (Addendum)
Please return in 6 months for your annual complete physical; please come fasting.   Glad you are doing well. Please keep a log of your home blood pressures for me.   If you have any questions or concerns, please don't hesitate to send me a message via MyChart or call the office at [phone]. Thank you for visiting with [place] today! It's our pleasure caring for you.   Hypertension, Adult High blood pressure (hypertension) is when the force of blood pumping through the arteries is too strong. The arteries are the blood vessels that carry blood from the heart throughout the body. Hypertension forces the heart to work harder to pump blood and may cause arteries to become narrow or stiff. Untreated or uncontrolled hypertension can lead to a heart attack, heart failure, a stroke, kidney disease, and other problems. A blood pressure reading consists of a higher number over a lower number. Ideally, your blood pressure should be below 120/80. The first ("top") number is called the systolic pressure. It is a measure of the pressure in your arteries as your heart beats. The second ("bottom") number is called the diastolic pressure. It is a measure of the pressure in your arteries as the heart relaxes. What are the causes? The exact cause of this condition is not known. There are some conditions that result in high blood pressure. What increases the risk? Certain factors may make you more likely to develop high blood pressure. Some of these risk factors are under your control, including: Smoking. Not getting enough exercise or physical activity. Being overweight. Having too much fat, sugar, calories, or salt (sodium) in your diet. Drinking too much alcohol. Other risk factors include: Having a personal history of heart disease, diabetes, high cholesterol, or kidney disease. Stress. Having a family history of high blood pressure and high cholesterol. Having obstructive sleep apnea. Age. The risk increases  with age. What are the signs or symptoms? High blood pressure may not cause symptoms. Very high blood pressure (hypertensive crisis) may cause: Headache. Fast or irregular heartbeats (palpitations). Shortness of breath. Nosebleed. Nausea and vomiting. Vision changes. Severe chest pain, dizziness, and seizures. How is this diagnosed? This condition is diagnosed by measuring your blood pressure while you are seated, with your arm resting on a flat surface, your legs uncrossed, and your feet flat on the floor. The cuff of the blood pressure monitor will be placed directly against the skin of your upper arm at the level of your heart. Blood pressure should be measured at least twice using the same arm. Certain conditions can cause a difference in blood pressure between your right and left arms. If you have a high blood pressure reading during one visit or you have normal blood pressure with other risk factors, you may be asked to: Return on a different day to have your blood pressure checked again. Monitor your blood pressure at home for 1 week or longer. If you are diagnosed with hypertension, you may have other blood or imaging tests to help your health care provider understand your overall risk for other conditions. How is this treated? This condition is treated by making healthy lifestyle changes, such as eating healthy foods, exercising more, and reducing your alcohol intake. You may be referred for counseling on a healthy diet and physical activity. Your health care provider may prescribe medicine if lifestyle changes are not enough to get your blood pressure under control and if: Your systolic blood pressure is above 130. Your diastolic blood pressure  is above 80. Your personal target blood pressure may vary depending on your medical conditions, your age, and other factors. Follow these instructions at home: Eating and drinking  Eat a diet that is high in fiber and potassium, and low in  sodium, added sugar, and fat. An example of this eating plan is called the DASH diet. DASH stands for Dietary Approaches to Stop Hypertension. To eat this way: Eat plenty of fresh fruits and vegetables. Try to fill one half of your plate at each meal with fruits and vegetables. Eat whole grains, such as whole-wheat pasta, brown rice, or whole-grain bread. Fill about one fourth of your plate with whole grains. Eat or drink low-fat dairy products, such as skim milk or low-fat yogurt. Avoid fatty cuts of meat, processed or cured meats, and poultry with skin. Fill about one fourth of your plate with lean proteins, such as fish, chicken without skin, beans, eggs, or tofu. Avoid pre-made and processed foods. These tend to be higher in sodium, added sugar, and fat. Reduce your daily sodium intake. Many people with hypertension should eat less than 1,500 mg of sodium a day. Do not drink alcohol if: Your health care provider tells you not to drink. You are pregnant, may be pregnant, or are planning to become pregnant. If you drink alcohol: Limit how much you have to: 0-1 drink a day for women. 0-2 drinks a day for men. Know how much alcohol is in your drink. In the [place] one drink equals one 12 oz bottle of beer (355 mL), one 5 oz glass of wine (148 mL), or one 1 oz glass of hard liquor (44 mL). Lifestyle  Work with your health care provider to maintain a healthy body weight or to lose weight. Ask what an ideal weight is for you. Get at least 30 minutes of exercise that causes your heart to beat faster (aerobic exercise) most days of the week. Activities may include walking, swimming, or biking. Include exercise to strengthen your muscles (resistance exercise), such as Pilates or lifting weights, as part of your weekly exercise routine. Try to do these types of exercises for 30 minutes at least 3 days a week. Do not use any products that contain nicotine or tobacco. These products include cigarettes,  chewing tobacco, and vaping devices, such as e-cigarettes. If you need help quitting, ask your health care provider. Monitor your blood pressure at home as told by your health care provider. Keep all follow-up visits. This is important. Medicines Take over-the-counter and prescription medicines only as told by your health care provider. Follow directions carefully. Blood pressure medicines must be taken as prescribed. Do not skip doses of blood pressure medicine. Doing this puts you at risk for problems and can make the medicine less effective. Ask your health care provider about side effects or reactions to medicines that you should watch for. Contact a health care provider if you: Think you are having a reaction to a medicine you are taking. Have headaches that keep coming back (recurring). Feel dizzy. Have swelling in your ankles. Have trouble with your vision. Get help right away if you: Develop a severe headache or confusion. Have unusual weakness or numbness. Feel faint. Have severe pain in your chest or abdomen. Vomit repeatedly. Have trouble breathing. These symptoms may be an emergency. Get help right away. Call 911. Do not wait to see if the symptoms will go away. Do not drive yourself to the hospital. Summary Hypertension is when the force of  blood pumping through your arteries is too strong. If this condition is not controlled, it may put you at risk for serious complications. Your personal target blood pressure may vary depending on your medical conditions, your age, and other factors. For most people, a normal blood pressure is less than 120/80. Hypertension is treated with lifestyle changes, medicines, or a combination of both. Lifestyle changes include losing weight, eating a healthy, low-sodium diet, exercising more, and limiting alcohol. This information is not intended to replace advice given to you by your health care provider. Make sure you discuss any questions you  have with your health care provider. Document Revised: 05/04/2021 Document Reviewed: 05/04/2021 Elsevier Patient Education  [employer].

## 2022-08-17 ENCOUNTER — Other Ambulatory Visit: Payer: Self-pay | Admitting: Family Medicine

## 2022-09-04 ENCOUNTER — Other Ambulatory Visit: Payer: Self-pay | Admitting: Family Medicine

## 2022-09-04 DIAGNOSIS — I10 Essential (primary) hypertension: Secondary | ICD-10-CM

## 2022-12-06 ENCOUNTER — Encounter: Payer: BC Managed Care – PPO | Admitting: Family Medicine

## 2022-12-30 ENCOUNTER — Encounter: Payer: Self-pay | Admitting: Family Medicine

## 2022-12-30 ENCOUNTER — Ambulatory Visit (INDEPENDENT_AMBULATORY_CARE_PROVIDER_SITE_OTHER): Payer: BC Managed Care – PPO | Admitting: Family Medicine

## 2022-12-30 VITALS — BP 130/80 | HR 59 | Temp 97.6°F | Ht 66.0 in | Wt 246.6 lb

## 2022-12-30 DIAGNOSIS — Z0000 Encounter for general adult medical examination without abnormal findings: Secondary | ICD-10-CM

## 2022-12-30 DIAGNOSIS — F5101 Primary insomnia: Secondary | ICD-10-CM | POA: Diagnosis not present

## 2022-12-30 DIAGNOSIS — E781 Pure hyperglyceridemia: Secondary | ICD-10-CM

## 2022-12-30 DIAGNOSIS — K219 Gastro-esophageal reflux disease without esophagitis: Secondary | ICD-10-CM

## 2022-12-30 DIAGNOSIS — Z23 Encounter for immunization: Secondary | ICD-10-CM

## 2022-12-30 DIAGNOSIS — J301 Allergic rhinitis due to pollen: Secondary | ICD-10-CM

## 2022-12-30 DIAGNOSIS — Z780 Asymptomatic menopausal state: Secondary | ICD-10-CM

## 2022-12-30 DIAGNOSIS — I10 Essential (primary) hypertension: Secondary | ICD-10-CM | POA: Diagnosis not present

## 2022-12-30 DIAGNOSIS — Z0001 Encounter for general adult medical examination with abnormal findings: Secondary | ICD-10-CM | POA: Diagnosis not present

## 2022-12-30 DIAGNOSIS — D72829 Elevated white blood cell count, unspecified: Secondary | ICD-10-CM | POA: Diagnosis not present

## 2022-12-30 DIAGNOSIS — R1902 Left upper quadrant abdominal swelling, mass and lump: Secondary | ICD-10-CM | POA: Diagnosis not present

## 2022-12-30 DIAGNOSIS — Z1231 Encounter for screening mammogram for malignant neoplasm of breast: Secondary | ICD-10-CM

## 2022-12-30 LAB — COMPREHENSIVE METABOLIC PANEL
ALT: 7 U/L (ref 0–35)
AST: 12 U/L (ref 0–37)
Albumin: 4 g/dL (ref 3.5–5.2)
Alkaline Phosphatase: 57 U/L (ref 39–117)
BUN: 11 mg/dL (ref 6–23)
CO2: 27 mEq/L (ref 19–32)
Calcium: 8.9 mg/dL (ref 8.4–10.5)
Chloride: 104 mEq/L (ref 96–112)
Creatinine, Ser: 0.73 mg/dL (ref 0.40–1.20)
GFR: 85.93 mL/min (ref >60.00)
Glucose, Bld: 91 mg/dL (ref 70–99)
Potassium: 4 mEq/L (ref 3.5–5.1)
Sodium: 139 mEq/L (ref 135–145)
Total Bilirubin: 0.4 mg/dL (ref 0.2–1.2)
Total Protein: 6.7 g/dL (ref 6.0–8.3)

## 2022-12-30 LAB — TSH: TSH: 1.2 u[IU]/mL (ref 0.35–5.50)

## 2022-12-30 LAB — CBC WITH DIFFERENTIAL/PLATELET
Basophils Absolute: 0.1 10*3/uL (ref 0.0–0.1)
Basophils Relative: 0.9 % (ref 0.0–3.0)
Eosinophils Absolute: 0.2 10*3/uL (ref 0.0–0.7)
Eosinophils Relative: 2.3 % (ref 0.0–5.0)
HCT: 41.1 % (ref 36.0–46.0)
Hemoglobin: 13.2 g/dL (ref 12.0–15.0)
Lymphocytes Relative: 24 % (ref 12.0–46.0)
Lymphs Abs: 2.2 10*3/uL (ref 0.7–4.0)
MCHC: 32.2 g/dL (ref 30.0–36.0)
MCV: 80.3 fl (ref 78.0–100.0)
Monocytes Absolute: 0.7 10*3/uL (ref 0.1–1.0)
Monocytes Relative: 8 % (ref 3.0–12.0)
Neutro Abs: 5.8 10*3/uL (ref 1.4–7.7)
Neutrophils Relative %: 64.8 % (ref 43.0–77.0)
Platelets: 393 10*3/uL (ref 150.0–400.0)
RBC: 5.13 Mil/uL — ABNORMAL HIGH (ref 3.87–5.11)
RDW: 16.3 % — ABNORMAL HIGH (ref 11.5–15.5)
WBC: 9 10*3/uL (ref 4.0–10.5)

## 2022-12-30 LAB — LIPID PANEL
Cholesterol: 170 mg/dL (ref 0–200)
HDL: 55 mg/dL (ref >39.00)
LDL Cholesterol: 93 mg/dL (ref 0–99)
NonHDL: 114.87
Total CHOL/HDL Ratio: 3
Triglycerides: 107 mg/dL (ref 0.0–149.0)
VLDL: 21.4 mg/dL (ref 0.0–40.0)

## 2022-12-30 MED ORDER — PANTOPRAZOLE SODIUM 20 MG PO TBEC: 20.0000 mg | DELAYED_RELEASE_TABLET | Freq: Every day | ORAL | 3 refills | Status: DC | PRN

## 2022-12-30 MED ORDER — AMLODIPINE BESYLATE 10 MG PO TABS: 10.0000 mg | ORAL_TABLET | Freq: Every day | ORAL | 3 refills | Status: DC

## 2022-12-30 NOTE — Patient Instructions (Addendum)
Please return in 6 months for hypertension follow up.   I will release your lab results to you on your MyChart account with further instructions. You may see the results before I do, but when I review them I will send you a message with my report or have my assistant call you if things need to be discussed. Please reply to my message with any questions. Thank you!   If you have any questions or concerns, please don't hesitate to send me a message via MyChart or call the office at [phone]. Thank you for visiting with [place] today! It's our pleasure caring for you.   Continue monitoring your blood pressures at home.   We will call you to set up an abdominal/pelvic CT scan due to feeling a possible mass on your abdominal exam.   Please call the office checked below to schedule your appointment for your mammogram and/or bone density screen (the checked studies were ordered): [x]   Mammogram  [x]   Bone Density  []   [facility]     [address] [place], [place]        [phone]         [x]   [facility]  [address] [place], [place]  [phone]

## 2022-12-30 NOTE — Progress Notes (Addendum)
Subjective  Chief Complaint  Patient presents with   Annual Exam    Pt here for Annual Exam and is currently fasting. Pt has not had DEXA    HPI: Sarah Palmer is a 67 y.o. female who presents to [facility] [place] today for a Female Wellness Visit. She also has the concerns and/or needs as listed above in the chief complaint. These will be addressed in addition to the Health Maintenance Visit.   Wellness Visit: annual visit with health maintenance review and exam without Pap  Health maintenance: Mammogram due in August, gets at [place].  Eligible for first bone density scan.  Mother has osteoporosis on medications.  Pap smears are complete and normal.  Eligible for tetanus shot today.  Other immunizations up-to-date.  Had initial eye exam, concerns for macular degeneration.  Needs to see ophthalmologist. Chronic disease f/u and/or acute problem visit: (deemed necessary to be done in addition to the wellness visit): [place] hypertension: Continues to monitor blood pressures at home consistently, will 120s to 130s over 70s to 80s.  Compliant with medications.  No chest pain shortness of breath or lower extremity edema. Hypertriglyceridemia on fenofibrate.  Fasting for recheck today. GERD is well-controlled on Protonix.  Due refill.  No blood in the stool.  No abdominal pain Trazodone continues to work well for sleep. Of note, had dental procedures, occasionally will have right sinus swelling.  Admits to allergy symptoms.  Not taking anything   Assessment  1. Annual physical exam   2. Need for Tdap vaccination   3. Primary insomnia   4. Hypertriglyceridemia   5. Leukocytosis, unspecified type   6. Gastroesophageal reflux disease without esophagitis   7. White coat syndrome with diagnosis of hypertension   8. Asymptomatic menopausal state   9. Screening mammogram for breast cancer   10. Left upper quadrant abdominal mass   11. Seasonal allergic rhinitis due to  pollen      Plan  Female Wellness Visit: Age appropriate Health Maintenance and Prevention measures were discussed with patient. Included topics are cancer screening recommendations, ways to keep healthy (see AVS) including dietary and exercise recommendations, regular eye and dental care, use of seat belts, and avoidance of moderate alcohol use and tobacco use.  Bone density ordered.  Mammogram to be scheduled.  Colonoscopy screening up-to-date BMI: discussed patient's BMI and encouraged positive lifestyle modifications to help get to or maintain a target BMI. HM needs and immunizations were addressed and ordered. See below for orders. See HM and immunization section for updates.  Tdap given today Routine labs and screening tests ordered including cmp, cbc and lipids where appropriate. Discussed recommendations regarding Vit D and calcium supplementation (see AVS)  Chronic disease management visit and/or acute problem visit: Large minimally tender mass on the left abdominal exam.  Patient admits to close getting tighter but no bloating or abdominal pain, no melena.  Has noted some weight gain.  Recommend CT scan abdomen and pelvis for further evaluation.  Could also consider ultrasound.  Check lab work. Primary insomnia is well-controlled on trazodone.  25 to 50 mg nightly Continue Protonix 20 mg daily for well-controlled GERD Recheck fasting lipids with history of hypertriglyceridemia fenofibrate 145 mg nightly.  Monitor LFTs.  Has been controlled. Monitor blood counts.  Likely that leukocytosis was related to his former smoking, has been resolved. Hypertension: Home readings are pretty well-controlled.  Will increase amlodipine to 10 mg daily from 5 mg.  Continue olmesartan 40 mg  daily and metoprolol 100 mg daily.  She will monitor for lower extremity ankle swelling.  Recheck 6 months.  Could add spironolactone at that time if needed. Sinus symptoms: Start Allegra or Zyrtec nightly.  Education  given.  Follow up: 6 months for hypertension recheck Orders Placed This Encounter  Procedures   DG Bone Density   MM DIGITAL SCREENING BILATERAL   CT Abdomen Pelvis W Contrast   Tdap vaccine greater than or equal to 7yo IM   CBC with Differential/Platelet   Comprehensive metabolic panel   Lipid panel   TSH   Meds ordered this encounter  Medications   pantoprazole (PROTONIX) 20 MG tablet    Sig: Take 1 tablet (20 mg total) by mouth daily as needed.    Dispense:  90 tablet    Refill:  3   amLODipine (NORVASC) 10 MG tablet    Sig: Take 1 tablet (10 mg total) by mouth daily.    Dispense:  90 tablet    Refill:  3    Increasing dose from 5mg  daily      Body mass index is 39.8 kg/m. Wt Readings from Last 3 Encounters:  12/30/22 246 lb 9.6 oz (111.9 kg)  05/20/22 230 lb (104.3 kg)  11/11/21 233 lb 6.4 oz (105.9 kg)     Patient Active Problem List   Diagnosis Date Noted   Insomnia 08/12/2016    Priority: High   White coat syndrome with diagnosis of hypertension     Priority: High    Neg eval for pheo; electrolyte disturbances on thiazide diuretic: severe hyponatremia and hypokalemia: seizures. 2015    History of melanoma     Priority: High    resected from lower extremity    Hypertriglyceridemia 04/15/2021    Priority: Medium    Leukocytosis 07/06/2018    Priority: Medium     Negative eval by hematology 2018; former smoker    Class 2 severe obesity due to excess calories with serious comorbidity and body mass index (BMI) of 37.0 to 37.9 in adult [facility])     Priority: Medium    Flushing 09/01/2016    Priority: Medium     Evaluated by Dr. Everardo All 2018 for pheochromocytoma; urine and initial lab tests were normal. To repeat testing if symptoms recur.     Gastroesophageal reflux disease 08/13/2016    Priority: Medium    History of alcohol abuse     Priority: Medium     Patient reports being in recovery for years and associated with a formal treatment program.     Atypical chest pain     Priority: Low    +Palpitations; negative chest CT in 2009; associated with URI in 06/2012; CT reviewed with radiologist-minimal, if any, coronary/aortic calcification.    Screening for colorectal cancer 12/12/2018    + cologuard with normal f/u colonoscopy 07/2018. Repeat in 10 years for routine screening. No more cologuards since 1st was abnormal    Health Maintenance  Topic Date Due   DEXA SCAN  Never done   COVID-19 Vaccine (6 - 2023-24 season) 01/15/2023 (Originally 06/26/2022)   INFLUENZA VACCINE  02/09/2023   MAMMOGRAM  03/10/2023   Colonoscopy  07/25/2028   DTaP/Tdap/Td (2 - Td or Tdap) 12/29/2032   Pneumonia Vaccine 24+ Years old  Completed   Hepatitis C Screening  Completed   Zoster Vaccines- Shingrix  Completed   HPV VACCINES  Aged Out   Immunization History  Administered Date(s) Administered   Influenza, High Dose Seasonal  PF 04/30/2022   Influenza,inj,Quad PF,6+ Mos 07/06/2018, 05/29/2019, 05/14/2020, 03/19/2021   Moderna Covid-19 Vaccine Bivalent Booster 66yrs & up 06/06/2021, 05/01/2022   Moderna Sars-Covid-2 Vaccination 09/19/2019, 10/10/2019, 05/14/2020   PNEUMOCOCCAL CONJUGATE-20 05/20/2022   Tdap 12/30/2022   Zoster Recombinat (Shingrix) 02/23/2019, 06/15/2019   We updated and reviewed the patient's past history in detail and it is documented below. Allergies: Patient is allergic to penicillins and thiazide-type diuretics. Past Medical History Patient  has a past medical history of Acid reflux, Anxiety, Chest pain (2009), History of alcohol abuse (2009), History of seizure - electrolyte abnormality (09/26/2013), Hyperlipidemia, Hypertension, Insomnia, Leukocytosis (07/06/2018), Melanoma (HCC) (1999), and Tobacco abuse. Past Surgical History Patient  has a past surgical history that includes Melanoma excision (1999) and Middle ear surgery (2017). Family History: Patient family history includes COPD in her father; Colon polyps in her  mother; Healthy in her mother, sister, and sister; Hypertension in an other family member; Obesity in her sister. Social History:  Patient  reports that she quit smoking about 9 years ago. Her smoking use included cigarettes. She has a 10.00 pack-year smoking history. She has never used smokeless tobacco. She reports that she does not drink alcohol and does not use drugs.  Review of Systems: Constitutional: negative for fever or malaise Ophthalmic: negative for photophobia, double vision or loss of vision Cardiovascular: negative for chest pain, dyspnea on exertion, or new LE swelling Respiratory: negative for SOB or persistent cough Gastrointestinal: negative for abdominal pain, change in bowel habits or melena Genitourinary: negative for dysuria or gross hematuria, no abnormal uterine bleeding or disharge Musculoskeletal: negative for new gait disturbance or muscular weakness Integumentary: negative for new or persistent rashes, no breast lumps Neurological: negative for TIA or stroke symptoms Psychiatric: negative for SI or delusions Allergic/Immunologic: negative for hives  Patient Care Team    Relationship Specialty Notifications Start End  Willow Ora, MD PCP - General Family Medicine  07/06/18   Kathlen Brunswick, MD (Inactive) Attending Physician Cardiology  07/06/12   Napoleon Form, MD Consulting Physician Gastroenterology  01/23/19     Objective  Vitals: BP 130/80 Comment: by home readings  Pulse (!) 59   Temp 97.6 F (36.4 C)   Wt 246 lb 9.6 oz (111.9 kg)   LMP 07/06/2008 (Approximate)   SpO2 95%   BMI 39.80 kg/m  General:  Well developed, well nourished, no acute distress  Psych:  Alert and orientedx3,normal mood and affect HEENT:  Normocephalic, atraumatic, non-icteric sclera,  supple neck without adenopathy, mass or thyromegaly Cardiovascular:  Normal S1, S2, RRR without gallop, rub or murmur Respiratory:  Good breath sounds bilaterally, CTAB with normal  respiratory effort Gastrointestinal: normal bowel sounds, soft, non-tender, left abdomen with large mobile minimally tender mass.  No HSM MSK: extremities without edema, joints without erythema or swelling Neurologic:    Mental status is normal.  Gross motor and sensory exams are normal.  No tremor  Commons side effects, risks, benefits, and alternatives for medications and treatment plan prescribed today were discussed, and the patient expressed understanding of the given instructions. Patient is instructed to call or message via MyChart if he/she has any questions or concerns regarding our treatment plan. No barriers to understanding were identified. We discussed Red Flag symptoms and signs in detail. Patient expressed understanding regarding what to do in case of urgent or emergency type symptoms.  Medication list was reconciled, printed and provided to the patient in AVS. Patient instructions and summary information was  reviewed with the patient as documented in the AVS. This note was prepared with assistance of Dragon voice recognition software. Occasional wrong-word or sound-a-like substitutions may have occurred due to the inherent limitations of voice recognition software

## 2023-01-03 NOTE — Progress Notes (Signed)
Labs reviewed.  The 10-year ASCVD risk score (Arnett DK, et al., 2019) is: 7.9% on fenofibrate. Not yet on statin. Will reassess next year. No CAD or DM.   Values used to calculate the score:     Age: 67 years     Sex: Female     Is Non-Hispanic African American: No     Diabetic: No     Tobacco smoker: No     Systolic Blood Pressure: 130 mmHg     Is BP treated: Yes     HDL Cholesterol: 55 mg/dL     Total Cholesterol: 170 mg/dL

## 2023-01-27 ENCOUNTER — Ambulatory Visit
Admission: RE | Admit: 2023-01-27 | Discharge: 2023-01-27 | Disposition: A | Discharge status: Home / Self Care | Payer: BC Managed Care – PPO | Source: Ambulatory Visit | Attending: Family Medicine | Admitting: Family Medicine

## 2023-01-27 DIAGNOSIS — R1909 Other intra-abdominal and pelvic swelling, mass and lump: Secondary | ICD-10-CM | POA: Diagnosis not present

## 2023-01-27 DIAGNOSIS — R1902 Left upper quadrant abdominal swelling, mass and lump: Secondary | ICD-10-CM

## 2023-01-27 MED ORDER — IOPAMIDOL (ISOVUE-300) INJECTION 61%
100.0000 mL | Freq: Once | INTRAVENOUS | Status: AC | PRN
  Administered 2023-01-27: 100 mL via INTRAVENOUS

## 2023-02-07 NOTE — Progress Notes (Signed)
Please call patient: CT scan confirms a mass in the abdomen but it is unclear what is the cause. It doesn't have features consistent with a cancer; looks more like a large cyst. I'd like her to see a general surgeon for further evaluation and management. Please place referral: DX: abdominal mass

## 2023-02-08 ENCOUNTER — Other Ambulatory Visit: Payer: Self-pay

## 2023-02-08 DIAGNOSIS — R1900 Intra-abdominal and pelvic swelling, mass and lump, unspecified site: Secondary | ICD-10-CM

## 2023-02-26 ENCOUNTER — Other Ambulatory Visit: Payer: Self-pay | Admitting: Family Medicine

## 2023-03-02 DIAGNOSIS — K668 Other specified disorders of peritoneum: Secondary | ICD-10-CM | POA: Diagnosis not present

## 2023-03-16 ENCOUNTER — Telehealth: Payer: Self-pay | Admitting: Gastroenterology

## 2023-03-16 NOTE — Telephone Encounter (Signed)
Inbound call from patient stating her provider with [facility] advised patient that she would be receiving a call regarding scheduling a colonoscopy. Patient requesting a call back to discuss further. Please advise, thank you.

## 2023-03-17 NOTE — Telephone Encounter (Signed)
It is fine to schedule patient as direct colonoscopy to exclude any colonic neoplasm, can be scheduled in [facility] if patient meets criteria for [facility].  Thank you

## 2023-03-20 NOTE — Telephone Encounter (Signed)
Patient has been scheduled for 10/1 colonoscopy. Patient also states she is having complication with a hemorrhoid. Patient requesting to know if banding could be done at time of colonoscopy. Please advise, thank you.

## 2023-03-20 NOTE — Telephone Encounter (Signed)
Advised this must be done as an office visit procedure for insurance reason and because she needs to be awake for a banding.

## 2023-03-22 ENCOUNTER — Encounter: Payer: Self-pay | Admitting: Gastroenterology

## 2023-03-24 ENCOUNTER — Ambulatory Visit: Payer: BC Managed Care – PPO | Admitting: *Deleted

## 2023-03-24 VITALS — Ht 66.0 in | Wt 220.0 lb

## 2023-03-24 DIAGNOSIS — R1900 Intra-abdominal and pelvic swelling, mass and lump, unspecified site: Secondary | ICD-10-CM

## 2023-03-24 MED ORDER — NA SULFATE-K SULFATE-MG SULF 17.5-3.13-1.6 GM/177ML PO SOLN
1.0000 | Freq: Once | ORAL | 0 refills | Status: AC
Start: 2023-03-24 — End: 2023-03-24

## 2023-03-24 NOTE — Progress Notes (Signed)
No egg or soy allergy known to patient  No issues known to pt with past sedation with any surgeries or procedures Patient denies ever being told they had issues or difficulty with intubation  No FH of Malignant Hyperthermia Pt is not on diet pills Pt is not on  home 02  Pt is not on blood thinners  Pt denies issues with constipation  No A fib or A flutter Have any cardiac testing pending--no Pt instructed to use Singlecare.[email or web address] or GoodRx for a price reduction on prep   

## 2023-03-31 ENCOUNTER — Encounter: Payer: Self-pay | Admitting: Gastroenterology

## 2023-04-11 ENCOUNTER — Ambulatory Visit: Payer: BC Managed Care – PPO | Admitting: Gastroenterology

## 2023-04-11 ENCOUNTER — Encounter: Payer: Self-pay | Admitting: Gastroenterology

## 2023-04-11 VITALS — BP 121/88 | HR 51 | Temp 98.9°F | Resp 16 | Ht 66.0 in | Wt 220.0 lb

## 2023-04-11 DIAGNOSIS — D123 Benign neoplasm of transverse colon: Secondary | ICD-10-CM | POA: Diagnosis not present

## 2023-04-11 DIAGNOSIS — R935 Abnormal findings on diagnostic imaging of other abdominal regions, including retroperitoneum: Secondary | ICD-10-CM | POA: Diagnosis not present

## 2023-04-11 DIAGNOSIS — R1900 Intra-abdominal and pelvic swelling, mass and lump, unspecified site: Secondary | ICD-10-CM

## 2023-04-11 MED ORDER — SODIUM CHLORIDE 0.9 % IV SOLN: 500.0000 mL | Freq: Once | INTRAVENOUS | Status: DC

## 2023-04-11 NOTE — Progress Notes (Signed)
Pt's states no medical or surgical changes since previsit or office visit. 

## 2023-04-11 NOTE — Progress Notes (Signed)
To pacu, VSS. Report to [email or web address] 

## 2023-04-11 NOTE — Patient Instructions (Addendum)
Continue present medications. Await pathology results. Repeat colonoscopy in 5-10 years for surveillance based on pathology results.                            YOU HAD AN ENDOSCOPIC PROCEDURE TODAY AT THE [facility]:   Refer to the procedure report that was given to you for any specific questions about what was found during the examination.  If the procedure report does not answer your questions, please call your gastroenterologist to clarify.  If you requested that your care partner not be given the details of your procedure findings, then the procedure report has been included in a sealed envelope for you to review at your convenience later.  YOU SHOULD EXPECT: Some feelings of bloating in the abdomen. Passage of more gas than usual.  Walking can help get rid of the air that was put into your GI tract during the procedure and reduce the bloating. If you had a lower endoscopy (such as a colonoscopy or flexible sigmoidoscopy) you may notice spotting of blood in your stool or on the toilet paper. If you underwent a bowel prep for your procedure, you may not have a normal bowel movement for a few days.  Please Note:  You might notice some irritation and congestion in your nose or some drainage.  This is from the oxygen used during your procedure.  There is no need for concern and it should clear up in a day or so.  SYMPTOMS TO REPORT IMMEDIATELY:  Following lower endoscopy (colonoscopy or flexible sigmoidoscopy):  Excessive amounts of blood in the stool  Significant tenderness or worsening of abdominal pains  Swelling of the abdomen that is new, acute  Fever of 100F or higher  For urgent or emergent issues, a gastroenterologist can be reached at any hour by calling [phone]. Do not use MyChart messaging for urgent concerns.    DIET:  We do recommend a small meal at first, but then you may proceed to your regular diet.  Drink plenty of fluids but you should avoid alcoholic  beverages for 24 hours.  ACTIVITY:  You should plan to take it easy for the rest of today and you should NOT DRIVE or use heavy machinery until tomorrow (because of the sedation medicines used during the test).    FOLLOW UP: Our staff will call the number listed on your records the next business day following your procedure.  We will call around 7:15- 8:00 am to check on you and address any questions or concerns that you may have regarding the information given to you following your procedure. If we do not reach you, we will leave a message.     If any biopsies were taken you will be contacted by phone or by letter within the next 1-3 weeks.  Please call us at [phone] if you have not heard about the biopsies in 3 weeks.    SIGNATURES/CONFIDENTIALITY: You and/or your care partner have signed paperwork which will be entered into your electronic medical record.  These signatures attest to the fact that that the information above on your After Visit Summary has been reviewed and is understood.  Full responsibility of the confidentiality of this discharge information lies with you and/or your care-partner.

## 2023-04-11 NOTE — Op Note (Signed)
[facility] Patient Name: Sarah Palmer Procedure Date: 04/11/2023 7:25 AM MRN: [id number] Endoscopist: Napoleon Form , MD, [id number] Age: 67 Referring MD:  Date of Birth: 02-Aug-1955 Gender: Female Account #: [id number] Procedure:                Colonoscopy Indications:              Abnormal CT of the GI tract Medicines:                Monitored Anesthesia Care Procedure:                Pre-Anesthesia Assessment:                           - Prior to the procedure, a History and Physical                            was performed, and patient medications and                            allergies were reviewed. The patient's tolerance of                            previous anesthesia was also reviewed. The risks                            and benefits of the procedure and the sedation                            options and risks were discussed with the patient.                            All questions were answered, and informed consent                            was obtained. Prior Anticoagulants: The patient has                            taken no anticoagulant or antiplatelet agents. ASA                            Grade Assessment: II - A patient with mild systemic                            disease. After reviewing the risks and benefits,                            the patient was deemed in satisfactory condition to                            undergo the procedure.                           After obtaining informed consent, the colonoscope  was passed under direct vision. Throughout the                            procedure, the patient's blood pressure, pulse, and                            oxygen saturations were monitored continuously. The                            Olympus Scope [id number] was introduced through the                            anus and advanced to the the cecum, identified by                            appendiceal orifice and  ileocecal valve. The                            colonoscopy was performed without difficulty. The                            patient tolerated the procedure well. The quality                            of the bowel preparation was good. The ileocecal                            valve, appendiceal orifice, and rectum were                            photographed. Scope In: 8:15:40 AM Scope Out: 8:32:09 AM Scope Withdrawal Time: 0 hours 13 minutes 41 seconds  Total Procedure Duration: 0 hours 16 minutes 29 seconds  Findings:                 The perianal and digital rectal examinations were                            normal.                           Two sessile polyps were found in the transverse                            colon. The polyps were 4 to 6 mm in size. These                            polyps were removed with a cold snare. Resection                            and retrieval were complete.                           Scattered small-mouthed diverticula were found in  the sigmoid colon and descending colon.                           Non-bleeding external and internal hemorrhoids were                            found during retroflexion. The hemorrhoids were                            medium-sized. Complications:            No immediate complications. Estimated Blood Loss:     Estimated blood loss was minimal. Impression:               - Two 4 to 6 mm polyps in the transverse colon,                            removed with a cold snare. Resected and retrieved.                           - Diverticulosis in the sigmoid colon and in the                            descending colon.                           - Non-bleeding external and internal hemorrhoids. Recommendation:           - Patient has a contact number available for                            emergencies. The signs and symptoms of potential                            delayed complications were discussed  with the                            patient. Return to normal activities tomorrow.                            Written discharge instructions were provided to the                            patient.                           - Resume previous diet.                           - Continue present medications.                           - Await pathology results.                           - Repeat colonoscopy in 5-10 years for surveillance  based on pathology results. Napoleon Form, MD 04/11/2023 8:41:41 AM This report has been signed electronically.

## 2023-04-11 NOTE — Progress Notes (Signed)
Called to room to assist during endoscopic procedure.  Patient ID and intended procedure confirmed with present staff. Received instructions for my participation in the procedure from the performing physician.  

## 2023-04-11 NOTE — Progress Notes (Signed)
[facility] History and Physical   Primary Care Physician:  Willow Ora, MD   Reason for Procedure:  Abnormal imaging CT  Plan:   colonoscopy with possible interventions as needed     HPI: Sarah Palmer is a very pleasant 67 y.o. female here for colonoscopy for evaluation for abnormal imaging.   The risks and benefits as well as alternatives of endoscopic procedure(s) have been discussed and reviewed. All questions answered. The patient agrees to proceed.    Past Medical History:  Diagnosis Date   Acid reflux    Allergy    seasonal   Anxiety    Chest pain 2009   associated with palpitations; negative chest CT in 2009   History of alcohol abuse 2009   History of seizure - electrolyte abnormality 09/26/2013   Hyperlipidemia    Hypertension    Insomnia    Leukocytosis 07/06/2018   Negative eval by hematology 2018   Melanoma (HCC) 1999   resected from lower extremity   Substance abuse (HCC)    etoh   Tobacco abuse     Past Surgical History:  Procedure Laterality Date   COLONOSCOPY     MELANOMA EXCISION  1999   lower extremity   MIDDLE EAR SURGERY  2017   osteoma   TONSILLECTOMY     as a child    Prior to Admission medications   Medication Sig Start Date End Date Taking? Authorizing Provider  amLODipine (NORVASC) 10 MG tablet Take 1 tablet (10 mg total) by mouth daily. 12/30/22  Yes Willow Ora, MD  fenofibrate (TRICOR) 145 MG tablet TAKE 1 TABLET BY MOUTH EVERY DAY 09/05/22  Yes Willow Ora, MD  metoprolol succinate (TOPROL-XL) 100 MG 24 hr tablet TAKE 1 TABLET BY MOUTH EVERY DAY WITH OR IMMEDIATELY FOLLOWING A MEAL 08/17/22  Yes Willow Ora, MD  olmesartan (BENICAR) 40 MG tablet TAKE 1 TABLET BY MOUTH EVERY DAY 09/05/22  Yes Willow Ora, MD  pantoprazole (PROTONIX) 20 MG tablet Take 1 tablet (20 mg total) by mouth daily as needed. 12/30/22  Yes Willow Ora, MD  traZODone (DESYREL) 100 MG tablet TAKE 1.5-2 TABLETS (150-200 MG  TOTAL) BY MOUTH AT BEDTIME. 02/27/23  Yes Worthy Rancher B, FNP  Flaxseed Oil OIL by Does not apply route.    [provider]    Current Outpatient Medications  Medication Sig Dispense Refill   amLODipine (NORVASC) 10 MG tablet Take 1 tablet (10 mg total) by mouth daily. 90 tablet 3   fenofibrate (TRICOR) 145 MG tablet TAKE 1 TABLET BY MOUTH EVERY DAY 90 tablet 3   metoprolol succinate (TOPROL-XL) 100 MG 24 hr tablet TAKE 1 TABLET BY MOUTH EVERY DAY WITH OR IMMEDIATELY FOLLOWING A MEAL 90 tablet 3   olmesartan (BENICAR) 40 MG tablet TAKE 1 TABLET BY MOUTH EVERY DAY 90 tablet 3   pantoprazole (PROTONIX) 20 MG tablet Take 1 tablet (20 mg total) by mouth daily as needed. 90 tablet 3   traZODone (DESYREL) 100 MG tablet TAKE 1.5-2 TABLETS (150-200 MG TOTAL) BY MOUTH AT BEDTIME. 180 tablet 3   Flaxseed Oil OIL by Does not apply route.     Current Facility-Administered Medications  Medication Dose Route Frequency Provider Last Rate Last Admin   0.9 %  sodium chloride infusion  500 mL Intravenous Once Napoleon Form, MD        Allergies as of 04/11/2023 - Review Complete 04/11/2023  Allergen Reaction Noted   Penicillins  10/21/2011   Thiazide-type diuretics Other (See Comments) 10/01/2013    Family History  Problem Relation Age of Onset   Healthy Mother    Colon polyps Mother    COPD Father    Healthy Sister    Obesity Sister    Healthy Sister    Hypertension Other    Other Neg Hx        pheochromocytoma   Heart disease Neg Hx    Cancer Neg Hx    Colon cancer Neg Hx    Esophageal cancer Neg Hx    Stomach cancer Neg Hx    Rectal cancer Neg Hx    Crohn's disease Neg Hx    Ulcerative colitis Neg Hx     Social History   Socioeconomic History   Marital status: Widowed    Spouse name: Not on file   Number of children: 0   Years of education: Not on file   Highest education level: Not on file  Occupational History   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 [employer]     Comment: ended 06/2019   Occupation: Surveyor, minerals, [employer]  Tobacco Use   Smoking status: Former    Current packs/day: 0.00    Average packs/day: 0.3 packs/day for 40.0 years (10.0 ttl pk-yrs)    Types: Cigarettes    Start date: 09/04/1973    Quit date: 09/04/2013    Years since quitting: 9.6   Smokeless tobacco: Never   Tobacco comments:    first quit attempt in 06/2012  Vaping Use   Vaping status: Never Used  Substance and Sexual Activity   Alcohol use: No    Alcohol/week: 2.0 - 3.0 standard drinks of alcohol    Types: 2 - 3 Standard drinks or equivalent per week    Comment: excessive alcohol use in the past noted in 2009   Drug use: No   Sexual activity: Yes    Birth control/protection: Post-menopausal  Other Topics Concern   Not on file  Social History Narrative   Widowed 2019; works full time, smoker. Former drinker   International aid/development worker of [employer] Strain: Not on file  Food Insecurity: Not on file  Transportation Needs: Not on file  Physical Activity: Not on file  Stress: Not on file  Social Connections: Not on file  Intimate Partner Violence: Not on file    Review of Systems:  All other review of systems negative except as mentioned in the HPI.  Physical Exam: Vital signs in last 24 hours: BP 115/64   Pulse (!) 53   Temp 98.9 F (37.2 C)   Resp 20   Ht 5\' 6"  (1.676 m)   Wt 220 lb (99.8 kg)   LMP 07/06/2008 (Approximate)   SpO2 94%   BMI 35.51 kg/m  General:   Alert, NAD Lungs:  Clear .   Heart:  Regular rate and rhythm Abdomen:  Soft, nontender and nondistended. Neuro/Psych:  Alert and cooperative. Normal mood and affect. A and O x 3  Reviewed labs, radiology imaging, old records and pertinent past GI work up  Patient is appropriate for planned procedure(s) and anesthesia in an ambulatory setting   K. Scherry Ran , MD [phone]

## 2023-04-12 ENCOUNTER — Telehealth: Payer: Self-pay

## 2023-04-12 NOTE — Telephone Encounter (Signed)
  Follow up Call-     04/11/2023    7:38 AM  Call back number  Post procedure Call Back phone  # [phone]  Permission to leave phone message Yes     Patient questions:  Do you have a fever, pain , or abdominal swelling? No. Pain Score  0 *  Have you tolerated food without any problems? Yes.    Have you been able to return to your normal activities? Yes.    Do you have any questions about your discharge instructions: Diet   No. Medications  No. Follow up visit  No.  Do you have questions or concerns about your Care? No.  Actions: * If pain score is 4 or above: No action needed, pain <4.

## 2023-04-13 LAB — SURGICAL PATHOLOGY

## 2023-04-14 ENCOUNTER — Encounter: Payer: Self-pay | Admitting: Gastroenterology

## 2023-05-20 ENCOUNTER — Other Ambulatory Visit: Payer: Self-pay

## 2023-05-20 ENCOUNTER — Emergency Department (HOSPITAL_COMMUNITY): Payer: BC Managed Care – PPO

## 2023-05-20 ENCOUNTER — Emergency Department (HOSPITAL_COMMUNITY)
Admission: EM | Admit: 2023-05-20 | Discharge: 2023-05-20 | Disposition: A | Discharge status: Home / Self Care | Payer: BC Managed Care – PPO | Attending: Emergency Medicine | Admitting: Emergency Medicine

## 2023-05-20 DIAGNOSIS — R0789 Other chest pain: Secondary | ICD-10-CM | POA: Diagnosis not present

## 2023-05-20 DIAGNOSIS — R079 Chest pain, unspecified: Secondary | ICD-10-CM | POA: Diagnosis not present

## 2023-05-20 DIAGNOSIS — Z79899 Other long term (current) drug therapy: Secondary | ICD-10-CM | POA: Diagnosis not present

## 2023-05-20 DIAGNOSIS — I10 Essential (primary) hypertension: Secondary | ICD-10-CM | POA: Diagnosis not present

## 2023-05-20 DIAGNOSIS — I700 Atherosclerosis of aorta: Secondary | ICD-10-CM | POA: Diagnosis not present

## 2023-05-20 LAB — CBC WITH DIFFERENTIAL/PLATELET
Abs Immature Granulocytes: 0.03 10*3/uL (ref 0.00–0.07)
Basophils Absolute: 0.1 10*3/uL (ref 0.0–0.1)
Basophils Relative: 1 %
Eosinophils Absolute: 0.2 10*3/uL (ref 0.0–0.5)
Eosinophils Relative: 2 %
HCT: 44 % (ref 36.0–46.0)
Hemoglobin: 14.1 g/dL (ref 12.0–15.0)
Immature Granulocytes: 0 %
Lymphocytes Relative: 17 %
Lymphs Abs: 1.3 10*3/uL (ref 0.7–4.0)
MCH: 27.4 pg (ref 26.0–34.0)
MCHC: 32 g/dL (ref 30.0–36.0)
MCV: 85.6 fL (ref 80.0–100.0)
Monocytes Absolute: 0.6 10*3/uL (ref 0.1–1.0)
Monocytes Relative: 8 %
Neutro Abs: 5.4 10*3/uL (ref 1.7–7.7)
Neutrophils Relative %: 72 %
Platelets: 287 10*3/uL (ref 150–400)
RBC: 5.14 MIL/uL — ABNORMAL HIGH (ref 3.87–5.11)
RDW: 15.7 % — ABNORMAL HIGH (ref 11.5–15.5)
WBC: 7.5 10*3/uL (ref 4.0–10.5)
nRBC: 0 % (ref 0.0–0.2)

## 2023-05-20 LAB — BASIC METABOLIC PANEL
Anion gap: 9 (ref 5–15)
BUN: 10 mg/dL (ref 8–23)
CO2: 27 mmol/L (ref 22–32)
Calcium: 8.5 mg/dL — ABNORMAL LOW (ref 8.9–10.3)
Chloride: 103 mmol/L (ref 98–111)
Creatinine, Ser: 0.65 mg/dL (ref 0.44–1.00)
GFR, Estimated: >60 mL/min (ref >60)
Glucose, Bld: 113 mg/dL — ABNORMAL HIGH (ref 70–99)
Potassium: 3.5 mmol/L (ref 3.5–5.1)
Sodium: 139 mmol/L (ref 135–145)

## 2023-05-20 LAB — D-DIMER, QUANTITATIVE: D-Dimer, Quant: 0.47 ug{FEU}/mL (ref 0.00–0.50)

## 2023-05-20 LAB — TROPONIN I (HIGH SENSITIVITY)
Troponin I (High Sensitivity): 5 ng/L (ref <18)
Troponin I (High Sensitivity): 6 ng/L (ref <18)

## 2023-05-20 NOTE — Discharge Instructions (Signed)
Workup here today without any acute findings.  No evidence of any blood clot in the lungs.  Chest x-ray clear no evidence of pneumonia.  Cardiac workup without any acute cardiac event.  Would recommend that you follow-up with your primary care doctor as well as cardiology for additional workup.

## 2023-05-20 NOTE — ED Triage Notes (Signed)
Pt c/o R sided  CP x 2 days intermittently that is stabbing in nature, dneies N/V, denies shobpt took 81 mg of aspirin before arrival to ED

## 2023-05-20 NOTE — ED Provider Notes (Addendum)
[name] [facility] AT [facility] Provider Note   CSN: [id number] Arrival date & time: 05/20/23  0703     History  Chief Complaint  Patient presents with   Chest Pain    Sarah Palmer is a 67 y.o. female.  Patient with the onset of intermittent chest pain that is on the right upper sternal area started yesterday morning.  1 present only there for seconds.  Not associated with shortness of breath no nausea vomiting.  No history of any unusual leg swelling.  No history of any injury.  Does seem to hurt a little bit more with some movement.  Patient has never had pain like this before.  Patient has a history of a mesenteric cyst that they plan to remove this December.  That cyst is benign.  Past medical history significant for hypertension had a history of palpitations in 2009.  Patient is on beta-blocker.  And Norvasc.  Patient has a history of alcohol abuse.  But has not used any alcohol since 2009.  Patient quit smoking also in 2015.  Used to be a heavy smoker.       Home Medications Prior to Admission medications   Medication Sig Start Date End Date Taking? Authorizing Provider  amLODipine (NORVASC) 10 MG tablet Take 1 tablet (10 mg total) by mouth daily. 12/30/22   Willow Ora, MD  fenofibrate (TRICOR) 145 MG tablet TAKE 1 TABLET BY MOUTH EVERY DAY 09/05/22   Willow Ora, MD  Flaxseed Oil OIL by Does not apply route.    [provider]  metoprolol succinate (TOPROL-XL) 100 MG 24 hr tablet TAKE 1 TABLET BY MOUTH EVERY DAY WITH OR IMMEDIATELY FOLLOWING A MEAL 08/17/22   Willow Ora, MD  olmesartan (BENICAR) 40 MG tablet TAKE 1 TABLET BY MOUTH EVERY DAY 09/05/22   Willow Ora, MD  pantoprazole (PROTONIX) 20 MG tablet Take 1 tablet (20 mg total) by mouth daily as needed. 12/30/22   Willow Ora, MD  traZODone (DESYREL) 100 MG tablet TAKE 1.5-2 TABLETS (150-200 MG TOTAL) BY MOUTH AT BEDTIME. 02/27/23   Worthy Rancher B, FNP      Allergies     Penicillins and Thiazide-type diuretics    Review of Systems   Review of Systems  Constitutional:  Negative for chills and fever.  HENT:  Negative for ear pain and sore throat.   Eyes:  Negative for pain and visual disturbance.  Respiratory:  Negative for cough and shortness of breath.   Cardiovascular:  Positive for chest pain. Negative for palpitations.  Gastrointestinal:  Negative for abdominal pain and vomiting.  Genitourinary:  Negative for dysuria and hematuria.  Musculoskeletal:  Negative for arthralgias and back pain.  Skin:  Negative for color change and rash.  Neurological:  Negative for seizures and syncope.  All other systems reviewed and are negative.   Physical Exam Updated Vital Signs BP 134/61   Pulse 65   Temp 97.9 F (36.6 C) (Oral)   Resp 14   LMP 07/06/2008 (Approximate)   SpO2 95%  Physical Exam Vitals and nursing note reviewed.  Constitutional:      General: She is not in acute distress.    Appearance: Normal appearance. She is well-developed.  HENT:     Head: Normocephalic and atraumatic.  Eyes:     Extraocular Movements: Extraocular movements intact.     Conjunctiva/sclera: Conjunctivae normal.     Pupils: Pupils are equal, round, and reactive to light.  Cardiovascular:     Rate and Rhythm: Normal rate and regular rhythm.     Heart sounds: No murmur heard. Pulmonary:     Effort: Pulmonary effort is normal. No respiratory distress.     Breath sounds: Normal breath sounds. No wheezing, rhonchi or rales.  Chest:     Chest wall: No tenderness.  Abdominal:     Palpations: Abdomen is soft.     Tenderness: There is no abdominal tenderness.  Musculoskeletal:        General: No swelling.     Cervical back: Normal range of motion and neck supple.  Skin:    General: Skin is warm and dry.     Capillary Refill: Capillary refill takes less than 2 seconds.  Neurological:     General: No focal deficit present.     Mental Status: She is alert and  oriented to person, place, and time.  Psychiatric:        Mood and Affect: Mood normal.     ED Results / Procedures / Treatments   Labs (all labs ordered are listed, but only abnormal results are displayed) Labs Reviewed  CBC WITH DIFFERENTIAL/PLATELET  BASIC METABOLIC PANEL  D-DIMER, QUANTITATIVE  TROPONIN I (HIGH SENSITIVITY)    EKG None  Radiology No results found.  Procedures Procedures    Medications Ordered in ED Medications - No data to display  ED Course/ Medical Decision Making/ A&P                                 Medical Decision Making Amount and/or Complexity of Data Reviewed Labs: ordered. Radiology: ordered.   Patient's chest pain has been intermittent.  When present only there for seconds making an acute cardiac event unlikely.  However we will go ahead and do chest pain workup.  Other concern could be pulmonary embolus.  Will get basic labs troponins chest x-ray and D-dimer.  D-dimer and normal.  Delta troponins without significant change.  CBC white count 7.5 hemoglobin 14.1 platelets 287.  Basic metabolic panel renal function normal electrolytes normal.  Chest x-ray with no active disease.  Patient stable for discharge home based on her age we will give her referral for cardiology for follow-up of atypical chest pain.   Final Clinical Impression(s) / ED Diagnoses Final diagnoses:  Atypical chest pain    Rx / DC Orders ED Discharge Orders     None         Vanetta Mulders, MD 05/20/23 [id number]    Vanetta Mulders, MD 05/20/23 1040

## 2023-05-27 ENCOUNTER — Other Ambulatory Visit: Payer: Self-pay | Admitting: Family Medicine

## 2023-06-06 ENCOUNTER — Telehealth: Payer: Self-pay | Admitting: *Deleted

## 2023-06-06 NOTE — Telephone Encounter (Signed)
Transition Care Management Unsuccessful Follow-up Telephone Call  Date of discharge and from where:  [facility]  05/20/2023  Attempts:  1st Attempt  Reason for unsuccessful TCM follow-up call:  Left voice message

## 2023-06-07 ENCOUNTER — Telehealth: Payer: Self-pay | Admitting: *Deleted

## 2023-06-07 NOTE — Telephone Encounter (Signed)
Transition Care Management Unsuccessful Follow-up Telephone Call  Date of discharge and from where:  [facility]  05/20/2023  Attempts: 2nd attempt   Reason for unsuccessful TCM follow-up call:  No answer/busy

## 2023-06-10 ENCOUNTER — Other Ambulatory Visit: Payer: Self-pay | Admitting: Family Medicine

## 2023-06-27 NOTE — Pre-Procedure Instructions (Signed)
Surgical Instructions   Your procedure is scheduled on June 30, 2023. Report to [facility] Main Entrance "A" at 5:30 A.M., then check in with the Admitting office. Any questions or running late day of surgery: call [phone]  Questions prior to your surgery date: call [phone], Monday-Friday, 8am-4pm. If you experience any cold or flu symptoms such as cough, fever, chills, shortness of breath, etc. between now and your scheduled surgery, please notify us at the above number.     Remember:  Do not eat after midnight the night before your surgery   You may drink clear liquids until 4:30 AM the morning of your surgery.   Clear liquids allowed are: Water, Non-Citrus Juices (without pulp), Carbonated Beverages, Clear Tea (no milk, honey, etc.), Black Coffee Only (NO MILK, CREAM OR POWDERED CREAMER of any kind), and Gatorade.    Take these medicines the morning of surgery with A SIP OF WATER: amLODipine (NORVASC)  fenofibrate (TRICOR)  metoprolol succinate (TOPROL-XL)  pantoprazole (PROTONIX)    May take these medicines IF NEEDED: acetaminophen (TYLENOL)    Follow your surgeon's instructions on when to stop Aspirin.  If no instructions were given by your surgeon then you will need to call the office to get those instructions.     One week prior to surgery, STOP taking any Aleve, Naproxen, Ibuprofen, Motrin, Advil, Goody's, BC's, all herbal medications, fish oil, and non-prescription vitamins.                     Do NOT Smoke (Tobacco/Vaping) for 24 hours prior to your procedure.  If you use a CPAP at night, you may bring your mask/headgear for your overnight stay.   You will be asked to remove any contacts, glasses, piercing's, hearing aid's, dentures/partials prior to surgery. Please bring cases for these items if needed.    Patients discharged the day of surgery will not be allowed to drive home, and someone needs to stay with them for 24 hours.  SURGICAL WAITING  ROOM VISITATION Patients may have no more than 2 support people in the waiting area - these visitors may rotate.   Pre-op nurse will coordinate an appropriate time for 1 ADULT support person, who may not rotate, to accompany patient in pre-op.  Children under the age of 40 must have an adult with them who is not the patient and must remain in the main waiting area with an adult.  If the patient needs to stay at the hospital during part of their recovery, the visitor guidelines for inpatient rooms apply.  Please refer to the [facility] website for the visitor guidelines for any additional information.   If you received a COVID test during your pre-op visit  it is requested that you wear a mask when out in public, stay away from anyone that may not be feeling well and notify your surgeon if you develop symptoms. If you have been in contact with anyone that has tested positive in the last 10 days please notify you surgeon.      Pre-operative CHG Bathing Instructions   You can play a key role in reducing the risk of infection after surgery. Your skin needs to be as free of germs as possible. You can reduce the number of germs on your skin by washing with CHG (chlorhexidine gluconate) soap before surgery. CHG is an antiseptic soap that kills germs and continues to kill germs even after washing.   DO NOT use if you have an  allergy to chlorhexidine/CHG or antibacterial soaps. If your skin becomes reddened or irritated, stop using the CHG and notify one of our RNs at [phone].              TAKE A SHOWER THE NIGHT BEFORE SURGERY AND THE DAY OF SURGERY    Please keep in mind the following:  DO NOT shave, including legs and underarms, 48 hours prior to surgery.   You may shave your face before/day of surgery.  Place clean sheets on your bed the night before surgery Use a clean washcloth (not used since being washed) for each shower. DO NOT sleep with pet's night before surgery.  CHG Shower  Instructions:  Wash your face and private area with normal soap. If you choose to wash your hair, wash first with your normal shampoo.  After you use shampoo/soap, rinse your hair and body thoroughly to remove shampoo/soap residue.  Turn the water OFF and apply half the bottle of CHG soap to a CLEAN washcloth.  Apply CHG soap ONLY FROM YOUR NECK DOWN TO YOUR TOES (washing for 3-5 minutes)  DO NOT use CHG soap on face, private areas, open wounds, or sores.  Pay special attention to the area where your surgery is being performed.  If you are having back surgery, having someone wash your back for you may be helpful. Wait 2 minutes after CHG soap is applied, then you may rinse off the CHG soap.  Pat dry with a clean towel  Put on clean pajamas    Additional instructions for the day of surgery: DO NOT APPLY any lotions, deodorants, cologne, or perfumes.   Do not wear jewelry or makeup Do not wear nail polish, gel polish, artificial nails, or any other type of covering on natural nails (fingers and toes) Do not bring valuables to the hospital. [facility] is not responsible for valuables/personal belongings. Put on clean/comfortable clothes.  Please brush your teeth.  Ask your nurse before applying any prescription medications to the skin.

## 2023-06-28 ENCOUNTER — Encounter: Payer: Self-pay | Admitting: Family Medicine

## 2023-06-28 ENCOUNTER — Encounter (HOSPITAL_COMMUNITY): Payer: Self-pay

## 2023-06-28 ENCOUNTER — Ambulatory Visit: Payer: Self-pay | Admitting: Surgery

## 2023-06-28 ENCOUNTER — Ambulatory Visit: Payer: BC Managed Care – PPO | Admitting: Family Medicine

## 2023-06-28 ENCOUNTER — Other Ambulatory Visit: Payer: Self-pay

## 2023-06-28 ENCOUNTER — Encounter (HOSPITAL_COMMUNITY)
Admission: RE | Admit: 2023-06-28 | Discharge: 2023-06-28 | Disposition: A | Discharge status: Home / Self Care | Payer: BC Managed Care – PPO | Source: Ambulatory Visit | Attending: Surgery | Admitting: Surgery

## 2023-06-28 VITALS — BP 150/80 | HR 52 | Temp 98.1°F | Resp 17 | Ht 66.0 in | Wt 231.0 lb

## 2023-06-28 VITALS — BP 120/68 | HR 54 | Temp 98.4°F | Ht 66.0 in | Wt 234.6 lb

## 2023-06-28 DIAGNOSIS — Z1231 Encounter for screening mammogram for malignant neoplasm of breast: Secondary | ICD-10-CM

## 2023-06-28 DIAGNOSIS — D235 Other benign neoplasm of skin of trunk: Secondary | ICD-10-CM | POA: Diagnosis not present

## 2023-06-28 DIAGNOSIS — I2510 Atherosclerotic heart disease of native coronary artery without angina pectoris: Secondary | ICD-10-CM | POA: Insufficient documentation

## 2023-06-28 DIAGNOSIS — R1907 Generalized intra-abdominal and pelvic swelling, mass and lump: Secondary | ICD-10-CM | POA: Diagnosis not present

## 2023-06-28 DIAGNOSIS — Z85820 Personal history of malignant melanoma of skin: Secondary | ICD-10-CM | POA: Diagnosis not present

## 2023-06-28 DIAGNOSIS — Z888 Allergy status to other drugs, medicaments and biological substances status: Secondary | ICD-10-CM | POA: Diagnosis not present

## 2023-06-28 DIAGNOSIS — D126 Benign neoplasm of colon, unspecified: Secondary | ICD-10-CM

## 2023-06-28 DIAGNOSIS — Z87891 Personal history of nicotine dependence: Secondary | ICD-10-CM | POA: Diagnosis not present

## 2023-06-28 DIAGNOSIS — Z01812 Encounter for preprocedural laboratory examination: Secondary | ICD-10-CM | POA: Insufficient documentation

## 2023-06-28 DIAGNOSIS — E785 Hyperlipidemia, unspecified: Secondary | ICD-10-CM | POA: Diagnosis not present

## 2023-06-28 DIAGNOSIS — M7989 Other specified soft tissue disorders: Secondary | ICD-10-CM | POA: Diagnosis not present

## 2023-06-28 DIAGNOSIS — M1990 Unspecified osteoarthritis, unspecified site: Secondary | ICD-10-CM | POA: Diagnosis not present

## 2023-06-28 DIAGNOSIS — Z6837 Body mass index (BMI) 37.0-37.9, adult: Secondary | ICD-10-CM

## 2023-06-28 DIAGNOSIS — Z880 Allergy status to penicillin: Secondary | ICD-10-CM | POA: Diagnosis not present

## 2023-06-28 DIAGNOSIS — K219 Gastro-esophageal reflux disease without esophagitis: Secondary | ICD-10-CM | POA: Diagnosis not present

## 2023-06-28 DIAGNOSIS — F419 Anxiety disorder, unspecified: Secondary | ICD-10-CM | POA: Diagnosis not present

## 2023-06-28 DIAGNOSIS — Z634 Disappearance and death of family member: Secondary | ICD-10-CM | POA: Diagnosis not present

## 2023-06-28 DIAGNOSIS — M7062 Trochanteric bursitis, left hip: Secondary | ICD-10-CM | POA: Diagnosis not present

## 2023-06-28 DIAGNOSIS — E66812 Obesity, class 2: Secondary | ICD-10-CM

## 2023-06-28 DIAGNOSIS — R8569 Abnormal cytological findings in specimens from other digestive organs and abdominal cavity: Secondary | ICD-10-CM | POA: Diagnosis not present

## 2023-06-28 DIAGNOSIS — Z8249 Family history of ischemic heart disease and other diseases of the circulatory system: Secondary | ICD-10-CM | POA: Diagnosis not present

## 2023-06-28 DIAGNOSIS — Z780 Asymptomatic menopausal state: Secondary | ICD-10-CM

## 2023-06-28 DIAGNOSIS — R1900 Intra-abdominal and pelvic swelling, mass and lump, unspecified site: Secondary | ICD-10-CM | POA: Diagnosis not present

## 2023-06-28 DIAGNOSIS — K668 Other specified disorders of peritoneum: Secondary | ICD-10-CM | POA: Diagnosis not present

## 2023-06-28 DIAGNOSIS — I10 Essential (primary) hypertension: Secondary | ICD-10-CM

## 2023-06-28 HISTORY — DX: Unspecified osteoarthritis, unspecified site: M19.90

## 2023-06-28 LAB — CBC
HCT: 43.5 % (ref 36.0–46.0)
Hemoglobin: 13.7 g/dL (ref 12.0–15.0)
MCH: 27 pg (ref 26.0–34.0)
MCHC: 31.5 g/dL (ref 30.0–36.0)
MCV: 85.8 fL (ref 80.0–100.0)
Platelets: 342 10*3/uL (ref 150–400)
RBC: 5.07 MIL/uL (ref 3.87–5.11)
RDW: 14.8 % (ref 11.5–15.5)
WBC: 9.4 10*3/uL (ref 4.0–10.5)
nRBC: 0 % (ref 0.0–0.2)

## 2023-06-28 LAB — BASIC METABOLIC PANEL
Anion gap: 6 (ref 5–15)
BUN: 10 mg/dL (ref 8–23)
CO2: 29 mmol/L (ref 22–32)
Calcium: 8.8 mg/dL — ABNORMAL LOW (ref 8.9–10.3)
Chloride: 106 mmol/L (ref 98–111)
Creatinine, Ser: 0.71 mg/dL (ref 0.44–1.00)
GFR, Estimated: >60 mL/min (ref >60)
Glucose, Bld: 94 mg/dL (ref 70–99)
Potassium: 4 mmol/L (ref 3.5–5.1)
Sodium: 141 mmol/L (ref 135–145)

## 2023-06-28 MED ORDER — TRIAMCINOLONE ACETONIDE 40 MG/ML IJ SUSP
40.0000 mg | Freq: Once | INTRAMUSCULAR | Status: AC
  Administered 2023-06-28: 40 mg via INTRAMUSCULAR

## 2023-06-28 NOTE — Progress Notes (Addendum)
PCP - Dr. Asencion Partridge Cardiologist - Dr. Conrad Bing in 2013 for CP - all work-up normal. No follow-up needed  PPM/ICD - Denies Device Orders - n/a Rep Notified - n/a  Chest x-ray - 05/20/2023 EKG - 05/20/2023 Stress Test - 12/14/2007 ECHO - 07/26/2012 Cardiac Cath - Denies  Sleep Study - Denies CPAP - n/a  No DM  Last dose of GLP1 agonist- n/a GLP1 instructions: n/a  Blood Thinner Instructions: n/a Aspirin Instructions: Pt stopped taking her ASA a few weeks ago. Instructed to keep holding medication until after surgery  ERAS Protcol - Clear liquids until 0430 morning of surgery PRE-SURGERY Ensure or G2- n/a  COVID TEST- n/a   Anesthesia review: No. CMP not completed for hx of ETOH abuse. Last drink was in 2009 and previous CMP results WNL   Patient denies shortness of breath, fever, cough and chest pain at PAT appointment. Pt endorses seasonal allergy symptoms, but no respiratory illness/infection in the last two months.    All instructions explained to the patient, with a verbal understanding of the material. Patient agrees to go over the instructions while at home for a better understanding. Patient also instructed to self quarantine after being tested for COVID-19. The opportunity to ask questions was provided.

## 2023-06-28 NOTE — Progress Notes (Signed)
Subjective  CC:  Chief Complaint  Patient presents with   Hypertension    Mammo/DEXA has not been done yet.   Leg Pain    HPI: Sarah Palmer is a 67 y.o. female who presents to the office today to address the problems listed above in the chief complaint.  6 mo f/u: Abdominal mass was found 6 months ago and her physical exam, I reviewed follow-up appointments with GI and general surgery.  GI did colonoscopy which showed tubular adenoma but no other complications.  Follow-up recommended in 7 years.  General surgery will perform removal of the mass which is likely a benign mesenteric cyst on December 20.  Discussed the use of AI scribe software for clinical note transcription with the patient, who gave verbal consent to proceed.  History of Present Illness   The patient, with a history of hypertension and acid reflux, is scheduled for surgery to remove a mesenteric cyst. The patient reports increased frequency of urination, but it is unclear if this is related to the cyst or another issue. The patient's blood pressure is well-controlled on three medications. The patient had a recent emergency room visit due to chest pain, which was attributed to stress and acid reflux. The patient is also on Protonix for acid reflux and reports improvement in symptoms with this medication.  The patient has been experiencing left leg pain for about three weeks, which is worse with laying on the side and walking up stairs. The patient has not noticed any back pain or buttock pain associated with this. The patient has been managing the pain with Tylenol. The patient reports that the pain can shoot down the leg and past the knee. The patient has not noticed any changes in the pain with different activities or positions, except for an increase in pain with walking up stairs.     Assessment  1. White coat syndrome with diagnosis of hypertension   2. Tubular adenoma of colon   3. Mesenteric cyst   4. Screening  mammogram for breast cancer   5. Asymptomatic menopausal state   6. Greater trochanteric bursitis of left hip   7. Class 2 severe obesity due to excess calories with serious comorbidity and body mass index (BMI) of 37.0 to 37.9 in adult [facility]) Chronic     Plan   Assessment and Plan    Hip Bursitis Left hip bursitis likely due to prolonged leaning and twisting while reading. Pain radiates down the leg, exacerbated by lying on the side and walking up stairs. No back pain or knee involvement. Physical exam revealed tenderness over the bursa. Steroid injection should provide relief within two to three days, with numbing effect felt within minutes. Icing and stretching may be needed if pain persists. - Administer steroid injection to the left hip bursa - Advise use of a pillow or laptop stand to avoid leaning and twisting - Provide handout on bursitis management - Recommend icing and stretching if pain persists - Follow up if symptoms do not improve  Mesenteric Cyst Scheduled for surgical removal of a 14 cm mesenteric cyst. Preoperative blood work completed. No current symptoms. Post-surgery recovery may include staples or a drain. Advised against travel shortly after surgery. - Proceed with scheduled surgery - Monitor post-operative recovery and follow up as needed  Chest Pain Recent episode of chest pain likely due to stress and acid reflux. No current symptoms. Recent emergency room visit and advised to follow up. No further cardiac evaluation needed based  on current assessment. - No further cardiac evaluation needed at this time - Monitor for any recurrence of symptoms  Hypertension Well-controlled hypertension with current blood pressure reading of 120/70 mmHg. On three antihypertensive medications with no issues reported. - Continue current antihypertensive medications - Monitor blood pressure regularly  Gastroesophageal Reflux Disease (GERD) Intermittent acid reflux symptoms,  well-controlled with Protonix 10 mg. Symptoms recur if medication is missed for a day or two. Long-term use of Protonix is common and beneficial in this case. - Continue Protonix 10 mg daily - Monitor for any changes in symptoms  General Health Maintenance Routine health maintenance discussed. Scheduled for a physical in six months. - Schedule physical exam in six months  Follow-up - Follow up in six months for a physical exam - Monitor post-surgery recovery and call if any issues arise.     No orders of the defined types were placed in this encounter.  Meds ordered this encounter  Medications   triamcinolone acetonide (KENALOG-40) injection 40 mg      BP Readings from Last 3 Encounters:  06/28/23 120/68  06/28/23 (!) 150/80  05/20/23 (!) 151/81   Wt Readings from Last 3 Encounters:  06/28/23 234 lb 9.6 oz (106.4 kg)  06/28/23 231 lb (104.8 kg)  04/11/23 220 lb (99.8 kg)    Lab Results  Component Value Date   CHOL 170 12/30/2022   CHOL 192 11/11/2021   CHOL 191 04/15/2021   Lab Results  Component Value Date   HDL 55.00 12/30/2022   HDL 72.80 11/11/2021   HDL 71.20 04/15/2021   Lab Results  Component Value Date   LDLCALC 93 12/30/2022   LDLCALC 100 (H) 11/11/2021   LDLCALC 102 (H) 04/15/2021   Lab Results  Component Value Date   TRIG 107.0 12/30/2022   TRIG 96.0 11/11/2021   TRIG 87.0 04/15/2021   Lab Results  Component Value Date   CHOLHDL 3 12/30/2022   CHOLHDL 3 11/11/2021   CHOLHDL 3 04/15/2021   Lab Results  Component Value Date   LDLDIRECT 120.0 09/07/2020   LDLDIRECT 127.0 01/23/2019   Lab Results  Component Value Date   CREATININE 0.71 06/28/2023   BUN 10 06/28/2023   NA 141 06/28/2023   K 4.0 06/28/2023   CL 106 06/28/2023   CO2 29 06/28/2023    The 10-year ASCVD risk score (Arnett DK, et al., 2019) is: 6.8%   Values used to calculate the score:     Age: 67 years     Sex: Female     Is Non-Hispanic African American: No      Diabetic: No     Tobacco smoker: No     Systolic Blood Pressure: 120 mmHg     Is BP treated: Yes     HDL Cholesterol: 55 mg/dL     Total Cholesterol: 170 mg/dL  I reviewed the patients updated PMH, FH, and SocHx.    Patient Active Problem List   Diagnosis Date Noted   Tubular adenoma of colon 06/28/2023    Priority: High   Insomnia 08/12/2016    Priority: High   White coat syndrome with diagnosis of hypertension     Priority: High   History of melanoma     Priority: High   Hypertriglyceridemia 04/15/2021    Priority: Medium    Leukocytosis 07/06/2018    Priority: Medium    Class 2 severe obesity due to excess calories with serious comorbidity and body mass index (BMI) of 37.0 to 37.9  in adult [facility])     Priority: Medium    Flushing 09/01/2016    Priority: Medium    Gastroesophageal reflux disease 08/13/2016    Priority: Medium    History of alcohol abuse     Priority: Medium    Atypical chest pain     Priority: Low   Screening for colorectal cancer 12/12/2018    Allergies: Penicillins and Thiazide-type diuretics  Social History: Patient  reports that she quit smoking about 9 years ago. Her smoking use included cigarettes. She started smoking about 49 years ago. She has a 10 pack-year smoking history. She has never used smokeless tobacco. She reports that she does not drink alcohol and does not use drugs.  Current Meds  Medication Sig   acetaminophen (TYLENOL) 500 MG tablet Take 500-1,000 mg by mouth every 6 (six) hours as needed (pain.).   amLODipine (NORVASC) 5 MG tablet Take 5 mg by mouth in the morning.   aspirin EC 81 MG tablet Take 81 mg by mouth daily. Swallow whole.   fenofibrate (TRICOR) 145 MG tablet TAKE 1 TABLET BY MOUTH EVERY DAY   metoprolol succinate (TOPROL-XL) 100 MG 24 hr tablet TAKE 1 TABLET BY MOUTH EVERY DAY WITH OR IMMEDIATELY FOLLOWING A MEAL   olmesartan (BENICAR) 40 MG tablet TAKE 1 TABLET BY MOUTH EVERY DAY   pantoprazole (PROTONIX) 20 MG  tablet Take 1 tablet (20 mg total) by mouth daily as needed. (Patient taking differently: Take 20 mg by mouth in the morning.)   traZODone (DESYREL) 100 MG tablet TAKE 1.5-2 TABLETS (150-200 MG TOTAL) BY MOUTH AT BEDTIME.   VITAMIN E PO Take 1 capsule by mouth in the morning.    Review of Systems: Cardiovascular: negative for chest pain, palpitations, leg swelling, orthopnea Respiratory: negative for SOB, wheezing or persistent cough Gastrointestinal: negative for abdominal pain Genitourinary: negative for dysuria or gross hematuria  Objective  Vitals: BP 120/68   Pulse (!) 54   Temp 98.4 F (36.9 C)   Ht 5\' 6"  (1.676 m)   Wt 234 lb 9.6 oz (106.4 kg)   LMP 07/06/2008 (Approximate)   SpO2 94%   BMI 37.87 kg/m  General: no acute distress  Psych:  Alert and oriented, normal mood and affect HEENT:  Normocephalic, atraumatic, supple neck  Cardiovascular:  RRR without murmur. no edema Respiratory:  Good breath sounds bilaterally, CTAB with normal respiratory effort Abd: left abdominal mass w/o ttp Back w/o ttp Left gr trochanteric bursa very ttp, FROM at hip neg slr, nl gait Skin:  Warm, no rashes Neurologic:   Mental status is normal  GR Trochanteric Bursa steroid injection  Procedure Note   Pre-operative Diagnosis: left hip bursitis   Post-operative Diagnosis: same   Indications: pain   Anesthesia: cold spray   Procedure Details    Verbal consent was obtained for the procedure. Universal time out done. The point of maximum tenderness was identified and marked over the hip bursa. The skin prepped with alcohol and cold spray used for anesthesia. A needle was advanced into the bursa and the steroid/lido (20mg  kenalog: 1.46ml lidocaine w/o epi) was administered easily.    Complications:  None; patient tolerated the procedure well.  Commons side effects, risks, benefits, and alternatives for medications and treatment plan prescribed today were discussed, and the patient  expressed understanding of the given instructions. Patient is instructed to call or message via MyChart if he/she has any questions or concerns regarding our treatment plan. No barriers to understanding were  identified. We discussed Red Flag symptoms and signs in detail. Patient expressed understanding regarding what to do in case of urgent or emergency type symptoms.  Medication list was reconciled, printed and provided to the patient in AVS. Patient instructions and summary information was reviewed with the patient as documented in the AVS. This note was prepared with assistance of Dragon voice recognition software. Occasional wrong-word or sound-a-like substitutions may have occurred due to the inherent limitation

## 2023-06-28 NOTE — Patient Instructions (Signed)
Please return in 6 months for your annual complete physical; please come fasting.    If you have any questions or concerns, please don't hesitate to send me a message via MyChart or call the office at [phone]. Thank you for visiting with [place] today! It's our pleasure caring for you.   VISIT SUMMARY:  During today's visit, we discussed several health concerns and reviewed your upcoming surgery. Your blood pressure is well-controlled, and your acid reflux symptoms are improving with medication. We also addressed your recent chest pain, left leg pain, and general health maintenance.  YOUR PLAN:  -HIP BURSITIS: Hip bursitis is inflammation of the bursa in the hip, often caused by repetitive motion or prolonged pressure. We administered a steroid injection to your left hip bursa to relieve the pain. You should feel some numbing within minutes and relief within two to three days. Use a pillow or laptop stand to avoid leaning and twisting, and refer to the provided handout on bursitis management. If the pain persists, icing and stretching may help. Please follow up if your symptoms do not improve.  -MESENTERIC CYST: A mesenteric cyst is a fluid-filled sac in the mesentery, the tissue that attaches the intestines to the abdominal wall. You are scheduled for surgery to remove the 14 cm cyst. Preoperative blood work has been completed. After surgery, you may have staples or a drain, and you should avoid travel shortly after the procedure. We will monitor your recovery and follow up as needed.  -CHEST PAIN: Your recent chest pain was likely due to stress and acid reflux, not a heart issue. Since you have no current symptoms, no further cardiac evaluation is needed at this time. Please monitor for any recurrence of symptoms.  -HYPERTENSION: Hypertension, or high blood pressure, is well-controlled with your current medications. Your blood pressure reading today was 120/70 mmHg. Continue taking your medications  as prescribed and monitor your blood pressure regularly.  -GASTROESOPHAGEAL REFLUX DISEASE (GERD): GERD is a condition where stomach acid frequently flows back into the esophagus, causing irritation. Your symptoms are well-controlled with Protonix 10 mg daily. Continue taking this medication and monitor for any changes in symptoms.  -GENERAL HEALTH MAINTENANCE: We discussed routine health maintenance and you are scheduled for a physical exam in six months. Continue with your current health practices and monitor for any new symptoms.  INSTRUCTIONS:  Follow up in six months for a physical exam. Monitor your post-surgery recovery and call if any issues arise.

## 2023-06-29 NOTE — Anesthesia Preprocedure Evaluation (Addendum)
Anesthesia Evaluation  Patient identified by MRN, date of birth, ID band Patient awake    Reviewed: Allergy & Precautions, NPO status , Patient's Chart, lab work & pertinent test results  History of Anesthesia Complications Negative for: history of anesthetic complications  Airway Mallampati: II  TM Distance: >3 FB Neck ROM: Full    Dental  (+) Caps, Dental Advisory Given   Pulmonary former smoker   breath sounds clear to auscultation       Cardiovascular hypertension, Pt. on medications and Pt. on home beta blockers (-) angina  Rhythm:Regular Rate:Normal     Neuro/Psych   Anxiety     negative neurological ROS     GI/Hepatic ,GERD  Medicated and Controlled,,(+)     substance abuse  alcohol use  Endo/Other    Class 3 obesity (BMI 37.9)  Renal/GU negative Renal ROS     Musculoskeletal  (+) Arthritis ,    Abdominal   Peds  Hematology negative hematology ROS (+)   Anesthesia Other Findings   Reproductive/Obstetrics                             Anesthesia Physical Anesthesia Plan  ASA: 2  Anesthesia Plan: General   Post-op Pain Management: Tylenol PO (pre-op)*   Induction: Intravenous  PONV Risk Score and Plan: 3 and Ondansetron, Dexamethasone and Scopolamine patch - Pre-op  Airway Management Planned: Oral ETT  Additional Equipment: None  Intra-op Plan:   Post-operative Plan: Extubation in OR  Informed Consent: I have reviewed the patients History and Physical, chart, labs and discussed the procedure including the risks, benefits and alternatives for the proposed anesthesia with the patient or authorized representative who has indicated his/her understanding and acceptance.     Dental advisory given  Plan Discussed with: CRNA and Surgeon  Anesthesia Plan Comments:         Anesthesia Quick Evaluation

## 2023-06-30 ENCOUNTER — Inpatient Hospital Stay (HOSPITAL_COMMUNITY)
Admission: RE | Admit: 2023-06-30 | Discharge: 2023-07-02 | DRG: 358 | Disposition: A | Discharge status: Home / Self Care | Payer: BC Managed Care – PPO | Attending: Surgery | Admitting: Surgery

## 2023-06-30 ENCOUNTER — Inpatient Hospital Stay (HOSPITAL_COMMUNITY): Payer: BC Managed Care – PPO | Admitting: Anesthesiology

## 2023-06-30 ENCOUNTER — Other Ambulatory Visit: Payer: Self-pay

## 2023-06-30 ENCOUNTER — Encounter (HOSPITAL_COMMUNITY)
Admission: RE | Disposition: A | Discharge status: Home / Self Care | Payer: Self-pay | Source: Home / Self Care | Attending: Surgery

## 2023-06-30 ENCOUNTER — Encounter (HOSPITAL_COMMUNITY): Payer: Self-pay | Admitting: Surgery

## 2023-06-30 DIAGNOSIS — Z8249 Family history of ischemic heart disease and other diseases of the circulatory system: Secondary | ICD-10-CM

## 2023-06-30 DIAGNOSIS — Z85820 Personal history of malignant melanoma of skin: Secondary | ICD-10-CM

## 2023-06-30 DIAGNOSIS — M7989 Other specified soft tissue disorders: Secondary | ICD-10-CM | POA: Diagnosis not present

## 2023-06-30 DIAGNOSIS — Z634 Disappearance and death of family member: Secondary | ICD-10-CM | POA: Diagnosis not present

## 2023-06-30 DIAGNOSIS — M1990 Unspecified osteoarthritis, unspecified site: Secondary | ICD-10-CM | POA: Diagnosis present

## 2023-06-30 DIAGNOSIS — K668 Other specified disorders of peritoneum: Principal | ICD-10-CM | POA: Diagnosis present

## 2023-06-30 DIAGNOSIS — I10 Essential (primary) hypertension: Secondary | ICD-10-CM | POA: Diagnosis present

## 2023-06-30 DIAGNOSIS — K219 Gastro-esophageal reflux disease without esophagitis: Secondary | ICD-10-CM | POA: Diagnosis present

## 2023-06-30 DIAGNOSIS — Z87891 Personal history of nicotine dependence: Secondary | ICD-10-CM | POA: Diagnosis not present

## 2023-06-30 DIAGNOSIS — Z880 Allergy status to penicillin: Secondary | ICD-10-CM | POA: Diagnosis not present

## 2023-06-30 DIAGNOSIS — Z888 Allergy status to other drugs, medicaments and biological substances status: Secondary | ICD-10-CM

## 2023-06-30 DIAGNOSIS — R1900 Intra-abdominal and pelvic swelling, mass and lump, unspecified site: Secondary | ICD-10-CM | POA: Diagnosis present

## 2023-06-30 DIAGNOSIS — D235 Other benign neoplasm of skin of trunk: Secondary | ICD-10-CM | POA: Diagnosis not present

## 2023-06-30 DIAGNOSIS — F419 Anxiety disorder, unspecified: Secondary | ICD-10-CM | POA: Diagnosis present

## 2023-06-30 DIAGNOSIS — E785 Hyperlipidemia, unspecified: Secondary | ICD-10-CM | POA: Diagnosis present

## 2023-06-30 DIAGNOSIS — R8569 Abnormal cytological findings in specimens from other digestive organs and abdominal cavity: Secondary | ICD-10-CM | POA: Diagnosis not present

## 2023-06-30 DIAGNOSIS — R1907 Generalized intra-abdominal and pelvic swelling, mass and lump: Secondary | ICD-10-CM | POA: Diagnosis not present

## 2023-06-30 HISTORY — PX: LAPAROTOMY: SHX154

## 2023-06-30 HISTORY — PX: RESECTION OF ABDOMINAL MASS: SHX6450

## 2023-06-30 LAB — HIV ANTIBODY (ROUTINE TESTING W REFLEX): HIV Screen 4th Generation wRfx: NONREACTIVE

## 2023-06-30 LAB — ABO/RH: ABO/RH(D): A POS

## 2023-06-30 LAB — PREPARE RBC (CROSSMATCH)

## 2023-06-30 SURGERY — LAPAROTOMY, EXPLORATORY
Anesthesia: General

## 2023-06-30 MED ORDER — ENOXAPARIN SODIUM 40 MG/0.4ML IJ SOSY
40.0000 mg | PREFILLED_SYRINGE | Freq: Once | INTRAMUSCULAR | Status: AC
  Administered 2023-06-30: 40 mg via SUBCUTANEOUS
  Filled 2023-06-30: qty 0.4

## 2023-06-30 MED ORDER — PROPOFOL 10 MG/ML IV BOLUS
INTRAVENOUS | Status: DC | PRN
  Administered 2023-06-30: 50 mg via INTRAVENOUS
  Administered 2023-06-30: 150 mg via INTRAVENOUS

## 2023-06-30 MED ORDER — EPHEDRINE 5 MG/ML INJ
INTRAVENOUS | Status: AC
  Filled 2023-06-30: qty 5

## 2023-06-30 MED ORDER — ONDANSETRON HCL 4 MG/2ML IJ SOLN: 4.0000 mg | Freq: Four times a day (QID) | INTRAMUSCULAR | Status: DC | PRN

## 2023-06-30 MED ORDER — FENTANYL CITRATE (PF) 250 MCG/5ML IJ SOLN
INTRAMUSCULAR | Status: DC | PRN
  Administered 2023-06-30 (×2): 50 ug via INTRAVENOUS
  Administered 2023-06-30: 150 ug via INTRAVENOUS

## 2023-06-30 MED ORDER — ENOXAPARIN SODIUM 40 MG/0.4ML IJ SOSY
40.0000 mg | PREFILLED_SYRINGE | Freq: Every day | INTRAMUSCULAR | Status: DC
  Administered 2023-07-01 – 2023-07-02 (×2): 40 mg via SUBCUTANEOUS
  Filled 2023-06-30 (×2): qty 0.4

## 2023-06-30 MED ORDER — ACETAMINOPHEN 500 MG PO TABS
1000.0000 mg | ORAL_TABLET | Freq: Four times a day (QID) | ORAL | Status: DC
  Administered 2023-07-01 – 2023-07-02 (×5): 1000 mg via ORAL
  Filled 2023-06-30 (×5): qty 2

## 2023-06-30 MED ORDER — METHOCARBAMOL 500 MG PO TABS
1000.0000 mg | ORAL_TABLET | Freq: Three times a day (TID) | ORAL | Status: DC
  Administered 2023-07-01 – 2023-07-02 (×4): 1000 mg via ORAL
  Filled 2023-06-30 (×4): qty 2

## 2023-06-30 MED ORDER — EPHEDRINE SULFATE-NACL 50-0.9 MG/10ML-% IV SOSY
PREFILLED_SYRINGE | INTRAVENOUS | Status: DC | PRN
  Administered 2023-06-30: 5 mg via INTRAVENOUS
  Administered 2023-06-30 (×2): 10 mg via INTRAVENOUS

## 2023-06-30 MED ORDER — MIDAZOLAM HCL 2 MG/2ML IJ SOLN
0.5000 mg | Freq: Once | INTRAMUSCULAR | Status: DC | PRN
Start: 2023-06-30 — End: 2023-06-30

## 2023-06-30 MED ORDER — KETOROLAC TROMETHAMINE 15 MG/ML IJ SOLN
15.0000 mg | Freq: Four times a day (QID) | INTRAMUSCULAR | Status: DC
  Administered 2023-06-30 – 2023-07-02 (×7): 15 mg via INTRAVENOUS
  Filled 2023-06-30 (×8): qty 1

## 2023-06-30 MED ORDER — MIDAZOLAM HCL 2 MG/2ML IJ SOLN
INTRAMUSCULAR | Status: AC
Start: 2023-06-30 — End: ?
  Filled 2023-06-30: qty 2

## 2023-06-30 MED ORDER — LIDOCAINE 2% (20 MG/ML) 5 ML SYRINGE
INTRAMUSCULAR | Status: AC
  Filled 2023-06-30: qty 5

## 2023-06-30 MED ORDER — CHLORHEXIDINE GLUCONATE 0.12 % MT SOLN
15.0000 mL | Freq: Once | OROMUCOSAL | Status: AC
  Administered 2023-06-30: 15 mL via OROMUCOSAL
  Filled 2023-06-30: qty 15

## 2023-06-30 MED ORDER — ORAL CARE MOUTH RINSE: 15.0000 mL | Freq: Once | OROMUCOSAL | Status: AC

## 2023-06-30 MED ORDER — SUGAMMADEX SODIUM 200 MG/2ML IV SOLN
INTRAVENOUS | Status: DC | PRN
  Administered 2023-06-30: 200 mg via INTRAVENOUS

## 2023-06-30 MED ORDER — ACETAMINOPHEN 500 MG PO TABS: 1000.0000 mg | ORAL_TABLET | Freq: Four times a day (QID) | ORAL | 3 refills | Status: AC

## 2023-06-30 MED ORDER — ACETAMINOPHEN 500 MG PO TABS: 1000.0000 mg | ORAL_TABLET | Freq: Once | ORAL | Status: DC

## 2023-06-30 MED ORDER — CHLORHEXIDINE GLUCONATE CLOTH 2 % EX PADS: 6.0000 | MEDICATED_PAD | Freq: Once | CUTANEOUS | Status: DC

## 2023-06-30 MED ORDER — DOCUSATE SODIUM 100 MG PO CAPS: 100.0000 mg | ORAL_CAPSULE | Freq: Two times a day (BID) | ORAL | 2 refills | Status: AC

## 2023-06-30 MED ORDER — ONDANSETRON HCL 4 MG/2ML IJ SOLN
INTRAMUSCULAR | Status: AC
  Filled 2023-06-30: qty 2

## 2023-06-30 MED ORDER — HYDROMORPHONE HCL 1 MG/ML IJ SOLN
INTRAMUSCULAR | Status: DC | PRN
  Administered 2023-06-30: .5 mg via INTRAVENOUS

## 2023-06-30 MED ORDER — LACTATED RINGERS IV SOLN: INTRAVENOUS | Status: DC

## 2023-06-30 MED ORDER — PROPOFOL 10 MG/ML IV BOLUS
INTRAVENOUS | Status: AC
  Filled 2023-06-30: qty 20

## 2023-06-30 MED ORDER — HYDROMORPHONE HCL 1 MG/ML IJ SOLN
INTRAMUSCULAR | Status: AC
  Filled 2023-06-30: qty 1

## 2023-06-30 MED ORDER — MEPERIDINE HCL 25 MG/ML IJ SOLN: 6.2500 mg | INTRAMUSCULAR | Status: DC | PRN

## 2023-06-30 MED ORDER — 0.9 % SODIUM CHLORIDE (POUR BTL) OPTIME
TOPICAL | Status: DC | PRN
  Administered 2023-06-30 (×2): 1000 mL

## 2023-06-30 MED ORDER — METHOCARBAMOL 1000 MG/10ML IJ SOLN
1000.0000 mg | Freq: Three times a day (TID) | INTRAMUSCULAR | Status: AC
  Administered 2023-06-30 – 2023-07-01 (×3): 1000 mg via INTRAVENOUS
  Filled 2023-06-30 (×3): qty 10

## 2023-06-30 MED ORDER — MIDAZOLAM HCL 2 MG/2ML IJ SOLN
INTRAMUSCULAR | Status: DC | PRN
  Administered 2023-06-30: 2 mg via INTRAVENOUS

## 2023-06-30 MED ORDER — OXYCODONE HCL 5 MG PO TABS
5.0000 mg | ORAL_TABLET | Freq: Once | ORAL | Status: DC | PRN
Start: 2023-06-30 — End: 2023-06-30

## 2023-06-30 MED ORDER — ONDANSETRON 4 MG PO TBDP
4.0000 mg | ORAL_TABLET | Freq: Four times a day (QID) | ORAL | Status: DC | PRN
  Administered 2023-07-02: 4 mg via ORAL
  Filled 2023-06-30: qty 1

## 2023-06-30 MED ORDER — DEXAMETHASONE SODIUM PHOSPHATE 10 MG/ML IJ SOLN
INTRAMUSCULAR | Status: DC | PRN
  Administered 2023-06-30: 10 mg via INTRAVENOUS

## 2023-06-30 MED ORDER — METHOCARBAMOL 750 MG PO TABS
750.0000 mg | ORAL_TABLET | Freq: Four times a day (QID) | ORAL | 1 refills | Status: AC
Start: 2023-06-30 — End: ?

## 2023-06-30 MED ORDER — PHENYLEPHRINE 80 MCG/ML (10ML) SYRINGE FOR IV PUSH (FOR BLOOD PRESSURE SUPPORT)
PREFILLED_SYRINGE | INTRAVENOUS | Status: DC | PRN
  Administered 2023-06-30: 160 ug via INTRAVENOUS
  Administered 2023-06-30: 80 ug via INTRAVENOUS

## 2023-06-30 MED ORDER — OXYCODONE HCL 5 MG PO TABS
5.0000 mg | ORAL_TABLET | ORAL | Status: DC | PRN
  Administered 2023-06-30 – 2023-07-02 (×6): 10 mg via ORAL
  Filled 2023-06-30 (×6): qty 2

## 2023-06-30 MED ORDER — PHENYLEPHRINE 80 MCG/ML (10ML) SYRINGE FOR IV PUSH (FOR BLOOD PRESSURE SUPPORT)
PREFILLED_SYRINGE | INTRAVENOUS | Status: AC
  Filled 2023-06-30: qty 10

## 2023-06-30 MED ORDER — CEFAZOLIN SODIUM-DEXTROSE 2-4 GM/100ML-% IV SOLN
2.0000 g | INTRAVENOUS | Status: AC
  Administered 2023-06-30: 2 g via INTRAVENOUS
  Filled 2023-06-30: qty 100

## 2023-06-30 MED ORDER — ONDANSETRON HCL 4 MG/2ML IJ SOLN
INTRAMUSCULAR | Status: DC | PRN
  Administered 2023-06-30: 4 mg via INTRAVENOUS

## 2023-06-30 MED ORDER — FENTANYL CITRATE (PF) 250 MCG/5ML IJ SOLN
INTRAMUSCULAR | Status: AC
  Filled 2023-06-30: qty 5

## 2023-06-30 MED ORDER — SCOPOLAMINE 1 MG/3DAYS TD PT72
1.0000 | MEDICATED_PATCH | TRANSDERMAL | Status: DC
  Administered 2023-06-30: 1.5 mg via TRANSDERMAL
  Filled 2023-06-30: qty 1

## 2023-06-30 MED ORDER — IBUPROFEN 600 MG PO TABS
600.0000 mg | ORAL_TABLET | Freq: Four times a day (QID) | ORAL | 1 refills | Status: AC
Start: 2023-06-30 — End: ?

## 2023-06-30 MED ORDER — LACTATED RINGERS IV SOLN: INTRAVENOUS | Status: AC

## 2023-06-30 MED ORDER — OXYCODONE HCL 5 MG PO TABS: 5.0000 mg | ORAL_TABLET | ORAL | 0 refills | Status: DC | PRN

## 2023-06-30 MED ORDER — ALBUMIN HUMAN 5 % IV SOLN: INTRAVENOUS | Status: DC | PRN

## 2023-06-30 MED ORDER — HYDROMORPHONE HCL 1 MG/ML IJ SOLN
0.2500 mg | INTRAMUSCULAR | Status: DC | PRN
  Administered 2023-06-30: 0.5 mg via INTRAVENOUS
  Administered 2023-06-30: 0.25 mg via INTRAVENOUS

## 2023-06-30 MED ORDER — LIDOCAINE 2% (20 MG/ML) 5 ML SYRINGE
INTRAMUSCULAR | Status: DC | PRN
  Administered 2023-06-30: 40 mg via INTRAVENOUS

## 2023-06-30 MED ORDER — OXYCODONE HCL 5 MG/5ML PO SOLN
5.0000 mg | Freq: Once | ORAL | Status: DC | PRN
Start: 2023-06-30 — End: 2023-06-30

## 2023-06-30 MED ORDER — ACETAMINOPHEN 10 MG/ML IV SOLN
1000.0000 mg | Freq: Four times a day (QID) | INTRAVENOUS | Status: AC
  Administered 2023-06-30 – 2023-07-01 (×3): 1000 mg via INTRAVENOUS
  Filled 2023-06-30 (×3): qty 100

## 2023-06-30 MED ORDER — MORPHINE SULFATE (PF) 2 MG/ML IV SOLN
2.0000 mg | INTRAVENOUS | Status: DC | PRN
Start: 2023-06-30 — End: 2023-07-02

## 2023-06-30 MED ORDER — ROCURONIUM BROMIDE 10 MG/ML (PF) SYRINGE
PREFILLED_SYRINGE | INTRAVENOUS | Status: AC
  Filled 2023-06-30: qty 10

## 2023-06-30 MED ORDER — ACETAMINOPHEN 500 MG PO TABS
1000.0000 mg | ORAL_TABLET | ORAL | Status: AC
  Administered 2023-06-30: 1000 mg via ORAL
  Filled 2023-06-30: qty 2

## 2023-06-30 MED ORDER — ROCURONIUM BROMIDE 10 MG/ML (PF) SYRINGE
PREFILLED_SYRINGE | INTRAVENOUS | Status: DC | PRN
  Administered 2023-06-30: 30 mg via INTRAVENOUS
  Administered 2023-06-30: 70 mg via INTRAVENOUS

## 2023-06-30 SURGICAL SUPPLY — 37 items
BLADE CLIPPER SURG (BLADE) IMPLANT
CANISTER SUCT 3000ML PPV (MISCELLANEOUS) ×1 IMPLANT
CHLORAPREP W/TINT 26 (MISCELLANEOUS) ×1 IMPLANT
COVER SURGICAL LIGHT HANDLE (MISCELLANEOUS) ×1 IMPLANT
DRAPE LAPAROSCOPIC ABDOMINAL (DRAPES) ×1 IMPLANT
DRAPE UNIVERSAL (DRAPES) ×1 IMPLANT
DRAPE WARM FLUID 44X44 (DRAPES) ×1 IMPLANT
DRSG OPSITE POSTOP 4X10 (GAUZE/BANDAGES/DRESSINGS) IMPLANT
DRSG OPSITE POSTOP 4X8 (GAUZE/BANDAGES/DRESSINGS) IMPLANT
ELECT BLADE 6.5 EXT (BLADE) IMPLANT
ELECT CAUTERY BLADE 6.4 (BLADE) ×1 IMPLANT
ELECT REM PT RETURN 9FT ADLT (ELECTROSURGICAL) ×1 IMPLANT
ELECTRODE REM PT RTRN 9FT ADLT (ELECTROSURGICAL) ×1 IMPLANT
GLOVE BIO SURGEON STRL SZ 6.5 (GLOVE) ×1 IMPLANT
GLOVE BIOGEL PI IND STRL 6 (GLOVE) ×1 IMPLANT
GOWN STRL REUS W/ TWL LRG LVL3 (GOWN DISPOSABLE) ×2 IMPLANT
HANDLE SUCTION POOLE (INSTRUMENTS) ×1 IMPLANT
KIT BASIN OR (CUSTOM PROCEDURE TRAY) ×1 IMPLANT
KIT TURNOVER KIT B (KITS) ×1 IMPLANT
LIGASURE IMPACT 36 18CM CVD LR (INSTRUMENTS) IMPLANT
NS IRRIG 1000ML POUR BTL (IV SOLUTION) ×2 IMPLANT
PACK GENERAL/GYN (CUSTOM PROCEDURE TRAY) ×1 IMPLANT
PAD ARMBOARD 7.5X6 YLW CONV (MISCELLANEOUS) ×1 IMPLANT
SHEARS FOC LG CVD HARMONIC 17C (MISCELLANEOUS) IMPLANT
SPONGE T-LAP 18X18 ~~LOC~~+RFID (SPONGE) IMPLANT
STAPLER VISISTAT 35W (STAPLE) ×1 IMPLANT
SUCTION POOLE HANDLE (INSTRUMENTS) ×1 IMPLANT
SUT PDS AB 1 TP1 54 (SUTURE) IMPLANT
SUT PDS AB 1 TP1 96 (SUTURE) IMPLANT
SUT SILK 2 0 SH CR/8 (SUTURE) ×1 IMPLANT
SUT SILK 2 0 TIES 10X30 (SUTURE) ×1 IMPLANT
SUT SILK 3 0 SH CR/8 (SUTURE) ×1 IMPLANT
SUT SILK 3 0 TIES 10X30 (SUTURE) ×1 IMPLANT
SUT VIC AB 3-0 SH 18 (SUTURE) IMPLANT
TOWEL GREEN STERILE (TOWEL DISPOSABLE) ×1 IMPLANT
TRAY FOLEY MTR SLVR 16FR STAT (SET/KITS/TRAYS/PACK) IMPLANT
YANKAUER SUCT BULB TIP NO VENT (SUCTIONS) IMPLANT

## 2023-06-30 NOTE — Anesthesia Procedure Notes (Signed)
Procedure Name: Intubation Date/Time: 06/30/2023 7:52 AM  Performed by: Thomasene Ripple, CRNAPre-anesthesia Checklist: Patient identified, Emergency Drugs available, Suction available and Patient being monitored Patient Re-evaluated:Patient Re-evaluated prior to induction Oxygen Delivery Method: Circle System Utilized Preoxygenation: Pre-oxygenation with 100% oxygen Induction Type: IV induction Ventilation: Mask ventilation without difficulty Laryngoscope Size: Miller and 3 Grade View: Grade II Tube type: Oral Tube size: 7.0 mm Number of attempts: 1 Airway Equipment and Method: Stylet and Oral airway Placement Confirmation: ETT inserted through vocal cords under direct vision, positive ETCO2 and breath sounds checked- equal and bilateral Secured at: 22 cm Tube secured with: Tape Dental Injury: Teeth and Oropharynx as per pre-operative assessment

## 2023-06-30 NOTE — Evaluation (Signed)
Physical Therapy Evaluation Patient Details Name: Sarah Palmer MRN: [id number] DOB: [birth year] Today's Date: 06/30/2023  History of Present Illness  67 y.o. female presents to [facility] hospital on 06/30/2023 for resection of cystic abdominal mass. PMH includes GERD, anxiety, alcohol abuse, HLD, HTN, melanoma.  Clinical Impression  Pt presents to PT with deficits in activity tolerance, gait, balance. Pt is able to ambulate for short household distances at this time, limited by reports of mild nausea and lightheadedness. Pt will benefit from frequent mobilization in an effort to restore her prior level of function. PT anticipates no post-acute PT or DME needs.        If plan is discharge home, recommend the following: A little help with bathing/dressing/bathroom;Assistance with cooking/housework;Help with stairs or ramp for entrance   Can travel by private vehicle        Equipment Recommendations None recommended by PT  Recommendations for Other Services       Functional Status Assessment Patient has had a recent decline in their functional status and demonstrates the ability to make significant improvements in function in a reasonable and predictable amount of time.     Precautions / Restrictions Precautions Precautions: Fall Restrictions Weight Bearing Restrictions Per Provider Order: No      Mobility  Bed Mobility Overal bed mobility: Needs Assistance Bed Mobility: Supine to Sit     Supine to sit: Supervision          Transfers Overall transfer level: Needs assistance Equipment used: None Transfers: Sit to/from Stand Sit to Stand: Supervision                Ambulation/Gait Ambulation/Gait assistance: Supervision Gait Distance (Feet): 60 Feet Assistive device: IV Pole (IV pole for final 25') Gait Pattern/deviations: Step-through pattern, Drifts right/left Gait velocity: reduced Gait velocity interpretation: <1.8 ft/sec, indicate of risk for recurrent falls    General Gait Details: pt with increased lateral drift, reports mild nausea and lightheadedness during ambulation  Stairs            Wheelchair Mobility     Tilt Bed    Modified Rankin (Stroke Patients Only)       Balance Overall balance assessment: Needs assistance Sitting-balance support: No upper extremity supported, Feet supported Sitting balance-Leahy Scale: Good     Standing balance support: Single extremity supported, Reliant on assistive device for balance Standing balance-Leahy Scale: Poor                               Pertinent Vitals/Pain Pain Assessment Pain Assessment: 0-10 Pain Score: 9  Pain Location: abdomen Pain Descriptors / Indicators: Sharp Pain Intervention(s): Monitored during session, Patient requesting pain meds-RN notified    Home Living Family/patient expects to be discharged to:: Private residence Living Arrangements: Alone Available Help at Discharge: Family;Available 24 hours/day (sister is staying for initial few weeks) Type of Home: House Home Access: Stairs to enter Entrance Stairs-Rails: Right Entrance Stairs-Number of Steps: 2 Alternate Level Stairs-Number of Steps: 6 Home Layout: Multi-level Home Equipment: None      Prior Function Prior Level of Function : Independent/Modified Independent;Working/employed;Driving                     Extremity/Trunk Assessment   Upper Extremity Assessment Upper Extremity Assessment: Overall WFL for tasks assessed    Lower Extremity Assessment Lower Extremity Assessment: Overall WFL for tasks assessed    Cervical / Trunk Assessment Cervical / Trunk  Assessment: Other exceptions (abdominal cyst resection)  Communication   Communication Communication: No apparent difficulties Cueing Techniques: Verbal cues  Cognition Arousal: Alert Behavior During Therapy: WFL for tasks assessed/performed Overall Cognitive Status: Within Functional Limits for tasks assessed                                           General Comments General comments (skin integrity, edema, etc.): pt on 2L [place] upon PT arrival, weaned to room air for mobility without reports of SOB or DOE, returned to 2L [place] at end of session due to recent anesthesia    Exercises     Assessment/Plan    PT Assessment Patient needs continued PT services  PT Problem List Decreased activity tolerance;Decreased balance;Decreased mobility;Decreased knowledge of use of DME       PT Treatment Interventions DME instruction;Gait training;Stair training;Functional mobility training;Therapeutic activities;Therapeutic exercise;Balance training;Patient/family education    PT Goals (Current goals can be found in the Care Plan section)  Acute Rehab PT Goals Patient Stated Goal: to return to independence, reduce pain PT Goal Formulation: With patient Time For Goal Achievement: 07/14/23 Potential to Achieve Goals: Good Additional Goals Additional Goal #1: Pt will score >19/24 on the DGI to indicate a reduced risk for falls    Frequency Min 1X/week     Co-evaluation               AM-PAC PT "6 Clicks" Mobility  Outcome Measure Help needed turning from your back to your side while in a flat bed without using bedrails?: A Little Help needed moving from lying on your back to sitting on the side of a flat bed without using bedrails?: A Little Help needed moving to and from a bed to a chair (including a wheelchair)?: A Little Help needed standing up from a chair using your arms (e.g., wheelchair or bedside chair)?: A Little Help needed to walk in hospital room?: A Little Help needed climbing 3-5 steps with a railing? : A Lot 6 Click Score: 17    End of Session Equipment Utilized During Treatment: Gait belt Activity Tolerance: Patient tolerated treatment well Patient left: in chair;with call bell/phone within reach Nurse Communication: Mobility status PT Visit Diagnosis: Other  abnormalities of gait and mobility (R26.89)    Time: 1610-9604 PT Time Calculation (min) (ACUTE ONLY): 21 min   Charges:   PT Evaluation $PT Eval Low Complexity: 1 Low   PT General Charges $$ ACUTE PT VISIT: 1 Visit         Arlyss Gandy, PT, DPT Acute Rehabilitation Office [phone]   Arlyss Gandy 06/30/2023, 3:31 PM

## 2023-06-30 NOTE — H&P (Signed)
Sarah Palmer is an 67 y.o. female.   HPI: 6F with cystic abdominal mass presents today for resection. The patient has had no hospitalizations, doctors visits, surgeries, or newly diagnosed allergies since being seen in the office. She did have an ED visit for chest pain two days before the funeral of her mother who died unexpectedly. I have reviewed her EKG and labs from that visit. I suspect these symptoms were more anxiety related and she has not had any chest pain since then. She has completed her bowel prep.    Past Medical History:  Diagnosis Date   Acid reflux    Allergy    seasonal   Anxiety    Arthritis    Chest pain 2009   associated with palpitations; negative chest CT in 2009   History of alcohol abuse 2009   History of seizure - electrolyte abnormality 09/26/2013   Hyperlipidemia    Hypertension    Insomnia    Leukocytosis 07/06/2018   Negative eval by hematology 2018   Melanoma (HCC) 1999   resected from lower extremity   Substance abuse (HCC)    etoh   Tobacco abuse     Past Surgical History:  Procedure Laterality Date   COLONOSCOPY     DENTAL SURGERY     Implants   MELANOMA EXCISION  1999   lower extremity   MIDDLE EAR SURGERY  2017   osteoma   TONSILLECTOMY     as a child    Family History  Problem Relation Age of Onset   Healthy Mother    Colon polyps Mother    COPD Father    Healthy Sister    Obesity Sister    Healthy Sister    Hypertension Other    Other Neg Hx        pheochromocytoma   Heart disease Neg Hx    Cancer Neg Hx    Colon cancer Neg Hx    Esophageal cancer Neg Hx    Stomach cancer Neg Hx    Rectal cancer Neg Hx    Crohn's disease Neg Hx    Ulcerative colitis Neg Hx     Social History:  reports that she quit smoking about 9 years ago. Her smoking use included cigarettes. She started smoking about 49 years ago. She has a 10 pack-year smoking history. She has never used smokeless tobacco. She reports that she does not  drink alcohol and does not use drugs.  Allergies:  Allergies  Allergen Reactions   Penicillins    Thiazide-Type Diuretics Other (See Comments)    SEVERE electrolyte disturbance (potassium 2.2 and sodium 114).      Medications: I have reviewed the patient's current medications.  Results for orders placed or performed during the hospital encounter of 06/30/23 (from the past 48 hours)  ABO/Rh     Status: None   Collection Time: 06/28/23  9:00 AM  Result Value Ref Range   ABO/RH(D)      A POS Performed at [facility], [address], [place], [place] [address]     No results found.  ROS 10 point review of systems is negative except as listed above in HPI.   Physical Exam Blood pressure (!) 151/77, pulse (!) 56, temperature 97.6 F (36.4 C), temperature source Oral, resp. rate 18, height 5\' 6"  (1.676 m), weight 104.8 kg, last menstrual period 07/06/2008, SpO2 95%. Constitutional: well-developed, well-nourished HEENT: pupils equal, round, reactive to light, 2mm b/l, moist  conjunctiva, external inspection of ears and nose normal, hearing intact Oropharynx: normal oropharyngeal mucosa, normal dentition Neck: no thyromegaly, trachea midline, no midline cervical tenderness to palpation Chest: breath sounds equal bilaterally, normal respiratory effort, no midline or lateral chest wall tenderness to palpation/deformity Abdomen: soft, NT, no bruising, no hepatosplenomegaly GU: normal female genitalia  Back: no wounds, no thoracic/lumbar spine tenderness to palpation, no thoracic/lumbar spine stepoffs Skin: warm, dry, no rashes Psych: normal memory, normal mood/affect     Assessment/Plan: Cystic abdominal mass - plan for resection today. Discussed the procedure in detail and potential findings/interventions such as bowel resection and inpatient hospitalization. Detailed discussion of risks and benefits, all questions answered to her satisfaction. Patient has completed her bowel  prep.   FEN - strict NPO DVT - SCDs, LMWH Dispo -  pending operative findings     Diamantina Monks, MD General and Trauma Surgery [facility]

## 2023-06-30 NOTE — Op Note (Addendum)
   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 year old female with abdominal cystic mass, 14cm. Pre-op underwent colonoscopy, which was normal. Clinical suspicion for mesenteric duplication cyst.   Surgeon: Diamantina Monks, MD Assistant: Gerrit Friends, MD  Anesthesiologist: Jean Rosenthal, MD Anesthesia: General  Findings:  Specimen: abdominal cyst wall x2 (one sent frozen, one sent permanent), abdominal cyst fluid (sent for cytology) EBL: 15cc Drains/Implants: none  Disposition: PACU - hemodynamically stable.  Description of procedure: The patient was positioned supine on the operating room table. General anesthetic induction and intubation were uneventful. Foley catheter insertion was performed and was atraumatic. Time-out was performed verifying correct patient, procedure, signature of informed consent, and administration of pre-operative antibiotics, VTE prophylaxis with low molecular weight heparin. The abdomen was prepped and draped in the usual sterile fashion.  A midline incision was made and deepened through the fascia until the peritoneal cavity was entered.  The cystic mass was immediately identified in the left mid abdomen.  It was intimately associated with the posterior sigmoid colon wall as well as the peritoneum.  It appeared to be a large mesenteric duplication cyst that had completely disrupted any visible mesentery.  Discussion was held with my assistant, Dr. Gerrit Friends, as well as one of our other colleagues Dr. Dwain Sarna regarding safest management for what was suspected to be benign mass.  Rather than perform a segmental colectomy with risk of ureteral injury given the densely adherent nature of the posterior wall of the mass to retroperitoneal  structures, the decision was made to aspirate the cyst contents and send to pathology and resect a segment of the wall to send for frozen.  The cyst contents were murky brown and 60 cc were sent to pathology for cytologic evaluation.  A segment of the cyst wall was sent to pathology as a frozen specimen.  Pathologist report revealed no malignant features and no evidence of ovarian stroma or ovarian parenchymal tissue.  The decision was made to make a larger fenestrating opening of the cyst with additional resection of the cyst wall.  The second segment of cyst wall that was resected was sent to pathology as a permanent specimen.  The abdomen was copiously irrigated and the fluid returned clear.  The fascia was closed with #1 PDS suture.  The skin was closed with staples.  Sterile dressings were applied. All sponge and instrument counts were correct at the conclusion of the procedure. The patient was awakened from anesthesia, extubated uneventfully, and transported to the PACU in good condition. There were no complications.    Diamantina Monks, MD General and Trauma Surgery [facility]

## 2023-06-30 NOTE — Transfer of Care (Signed)
Immediate Anesthesia Transfer of Care Note  Patient: Sarah Palmer  Procedure(s) Performed: EXPLORATORY LAPAROTOMY RESECTION OF ABDOMINAL MASS CYSTIC LESION OF ABDOMINAL VISCERA  Patient Location: PACU  Anesthesia Type:General  Level of Consciousness: drowsy  Airway & Oxygen Therapy: Patient Spontanous Breathing and Patient connected to face mask oxygen  Post-op Assessment: Report given to RN and Post -op Vital signs reviewed and stable  Post vital signs: Reviewed and stable  Last Vitals:  Vitals Value Taken Time  BP 112/66 06/30/23 1006  Temp 36.6 C 06/30/23 1006  Pulse 53 06/30/23 1011  Resp 18 06/30/23 1011  SpO2 96 % 06/30/23 1011  Vitals shown include unfiled device data.  Last Pain:  Vitals:   06/30/23 0602  TempSrc:   PainSc: 0-No pain         Complications: No notable events documented.

## 2023-06-30 NOTE — Anesthesia Postprocedure Evaluation (Signed)
Anesthesia Post Note  Patient: [name]  Procedure(s) Performed: EXPLORATORY LAPAROTOMY FENESTRATION OF ABDOMINAL CYST     Patient location during evaluation: PACU Anesthesia Type: General Level of consciousness: awake and alert, patient cooperative, oriented and sedated Pain management: pain level controlled Vital Signs Assessment: post-procedure vital signs reviewed and stable Respiratory status: spontaneous breathing, nonlabored ventilation and respiratory function stable Cardiovascular status: blood pressure returned to baseline and stable Postop Assessment: no apparent nausea or vomiting Anesthetic complications: no   No notable events documented.  Last Vitals:  Vitals:   06/30/23 1315 06/30/23 1347  BP: 122/64 (!) 119/58  Pulse: (!) 53 (!) 54  Resp: 18 20  Temp: 36.6 C (!) 36.4 C  SpO2: 91% 93%    Last Pain:  Vitals:   06/30/23 1347  TempSrc: Oral  PainSc: Asleep                 Rabab Currington,E. Jizelle Conkey

## 2023-06-30 NOTE — Discharge Instructions (Signed)
[facility]  LAPAROSCOPIC SURGERY: POST OP INSTRUCTIONS Always review your discharge instruction sheet given to you by the facility where your surgery was performed. IF YOU HAVE DISABILITY OR FAMILY LEAVE FORMS, YOU MUST BRING THEM TO THE OFFICE FOR PROCESSING.   DO NOT GIVE THEM TO YOUR DOCTOR.  PAIN CONTROL  Pain regimen: take over-the-counter tylenol (acetaminophen) 1000mg  every six hours, the prescription ibuprofen (600mg ) every six hours and the robaxin (methocarbamol) 750mg  every six hours. With all three of these, you should be taking something every two hours. Example: tylenol (acetaminophen) at 8am, ibuprofen at 10am, robaxin (methocarbamol) at 12pm, tylenol (acetaminophen) again at 2pm, ibuprofen again at 4pm, robaxin (methocarbamol) at 6pm. You also have a prescription for oxycodone, which should be taken if the tylenol (acetaminophen), ibuprofen, and robaxin (methocarbamol) are not enough to control your pain. You may take the oxycodone as frequently as every four hours as needed, but if you are taking the other medications as above, you should not need the oxycodone this frequently. You have also been given a prescription for colace (docusate) which is a stool softener. Please take this as prescribed because the oxycodone can cause constipation and the colace (docusate) will minimize or prevent constipation. Do not drive while taking or under the influence of the oxycodone as it is a narcotic medication. Use ice packs to help control pain. If you need a refill on your pain medication, please contact your pharmacy.  They will contact our office to request authorization. Prescriptions will not be filled after 5pm or on week-ends.  HOME MEDICATIONS Take your usually prescribed medications unless otherwise directed.  DIET You should follow a light diet the first few days after arrival home.  Be sure to include lots of fluids daily.   CONSTIPATION It is common to  experience some constipation after surgery and if you are taking pain medication.  Increasing fluid intake and taking a stool softener (such as Colace) will usually help or prevent this problem from occurring.  A mild laxative (Miralax, over-the counter) should be taken according to package instructions if there are no bowel movements after 48 hours. If still no bowel movement 24 hours after taking Miralax, you may try magnesium citrate, available over the counter at a local pharmacy.   WOUND/INCISION CARE Most patients will experience some swelling and bruising in the area of the incisions.  Ice packs will help.  Swelling and bruising can take several days to resolve.  May shower beginning 07/01/2023.  Do not peel off or scrub skin glue. May allow warm soapy water to run over incision, then rinse and pat dry.  Do not soak in any water (tubs, hot tubs, pools, lakes, oceans) for one week.   ACTIVITIES You may resume regular (light) daily activities beginning the next day--such as daily self-care, walking, climbing stairs--gradually increasing activities as tolerated.  You may have sexual intercourse when it is comfortable.   No lifting greater than 5 pounds for six weeks.  You may drive when you are no longer taking narcotic pain medication, you can comfortably wear a seatbelt, and you can safely maneuver your car and apply brakes.  FOLLOW-UP You should see your doctor in the office for a follow-up appointment approximately 2-3 weeks after your surgery.  You should have been given your post-op/follow-up appointment when your surgery was scheduled.  If you did not receive a post-op/follow-up appointment, make sure that you call for this appointment within a day or two after  you arrive home to ensure a convenient appointment time.  WHEN TO CALL YOUR DOCTOR: Fever over 101.5 Inability to urinate Continued bleeding from incision. Increased pain, redness, or drainage from the incision. Increasing  abdominal pain  The clinic staff is available to answer your questions during regular business hours.  Please don't hesitate to call and ask to speak to one of the nurses for clinical concerns.  If you have a medical emergency, go to the nearest emergency room or call 911.  A surgeon from [facility] is always on call at the hospital. [address], [place], [place]  [address] ? [address] [address], [place], [place]   [address] [phone] ? [phone] ? FAX [phone] Web site: [email or web address]

## 2023-07-01 ENCOUNTER — Encounter (HOSPITAL_COMMUNITY): Payer: Self-pay | Admitting: Surgery

## 2023-07-01 LAB — BASIC METABOLIC PANEL
Anion gap: 9 (ref 5–15)
BUN: 16 mg/dL (ref 8–23)
CO2: 25 mmol/L (ref 22–32)
Calcium: 8.8 mg/dL — ABNORMAL LOW (ref 8.9–10.3)
Chloride: 102 mmol/L (ref 98–111)
Creatinine, Ser: 0.97 mg/dL (ref 0.44–1.00)
GFR, Estimated: >60 mL/min (ref >60)
Glucose, Bld: 116 mg/dL — ABNORMAL HIGH (ref 70–99)
Potassium: 3.3 mmol/L — ABNORMAL LOW (ref 3.5–5.1)
Sodium: 136 mmol/L (ref 135–145)

## 2023-07-01 LAB — CBC
HCT: 38.3 % (ref 36.0–46.0)
Hemoglobin: 12.5 g/dL (ref 12.0–15.0)
MCH: 27.3 pg (ref 26.0–34.0)
MCHC: 32.6 g/dL (ref 30.0–36.0)
MCV: 83.6 fL (ref 80.0–100.0)
Platelets: 328 10*3/uL (ref 150–400)
RBC: 4.58 MIL/uL (ref 3.87–5.11)
RDW: 14.8 % (ref 11.5–15.5)
WBC: 17.6 10*3/uL — ABNORMAL HIGH (ref 4.0–10.5)
nRBC: 0 % (ref 0.0–0.2)

## 2023-07-01 MED ORDER — SIMETHICONE 80 MG PO CHEW
80.0000 mg | CHEWABLE_TABLET | Freq: Four times a day (QID) | ORAL | Status: DC | PRN
  Administered 2023-07-01 – 2023-07-02 (×2): 80 mg via ORAL
  Filled 2023-07-01 (×2): qty 1

## 2023-07-01 MED ORDER — POLYETHYLENE GLYCOL 3350 17 G PO PACK
17.0000 g | PACK | Freq: Every day | ORAL | Status: DC
  Administered 2023-07-01 – 2023-07-02 (×2): 17 g via ORAL
  Filled 2023-07-01 (×2): qty 1

## 2023-07-01 MED ORDER — POTASSIUM CHLORIDE 20 MEQ PO PACK
40.0000 meq | PACK | Freq: Once | ORAL | Status: AC
  Administered 2023-07-01: 40 meq via ORAL
  Filled 2023-07-01: qty 2

## 2023-07-01 NOTE — Progress Notes (Signed)
1 Day Post-Op   Subjective/Chief Complaint: Abdominal pain still an issue.  No bowel function yet.  No nausea vomiting.   Objective: Vital signs in last 24 hours: Temp:  [97.5 F (36.4 C)-98.6 F (37 C)] 98.5 F (36.9 C) (12/21 0829) Pulse Rate:  [48-69] 69 (12/21 0829) Resp:  [16-22] 18 (12/21 0829) BP: (92-165)/(48-91) 132/91 (12/21 0829) SpO2:  [91 %-97 %] 95 % (12/21 0829) Last BM Date : 06/29/23  Intake/Output from previous day: 12/20 0701 - 12/21 0700 In: 2212.1 [P.O.:240; I.V.:1524; IV Piggyback:448.1] Out: 30 [Blood:30] Intake/Output this shift: No intake/output data recorded.  General appearance: alert and cooperative Resp: clear to auscultation bilaterally Cardio: NSR Incision/Wound: Honeycomb dressing in place.  Obese appropriately tender without peritonitis minimal drainage  Lab Results:  Recent Labs    07/01/23 0601  WBC 17.6*  HGB 12.5  HCT 38.3  PLT 328   BMET Recent Labs    07/01/23 0601  NA 136  K 3.3*  CL 102  CO2 25  GLUCOSE 116*  BUN 16  CREATININE 0.97  CALCIUM 8.8*   PT/INR No results for input(s): "LABPROT", "INR" in the last 72 hours. ABG No results for input(s): "PHART", "HCO3" in the last 72 hours.  Invalid input(s): "PCO2", "PO2"  Studies/Results: No results found.  Anti-infectives: Anti-infectives (From admission, onward)    Start     Dose/Rate Route Frequency Ordered Stop   06/30/23 0600  ceFAZolin (ANCEF) IVPB 2g/100 mL premix        2 g 200 mL/hr over 30 Minutes Intravenous On call to O.R. 06/30/23 0544 06/30/23 0756       Assessment/Plan: s/p Procedure(s): EXPLORATORY LAPAROTOMY (N/A) FENESTRATION OF ABDOMINAL CYST (N/A) Encourage ambulation  Await the return of bowel function  Abdominal binder  Hopefully discharge in next 24 to 48 hours depending on bowel function.  LOS: 1 day    Dortha Schwalbe MD 07/01/2023

## 2023-07-01 NOTE — Plan of Care (Signed)
  Problem: Pain Management: Goal: General experience of comfort will improve Outcome: Progressing   Problem: Safety: Goal: Ability to remain free from injury will improve Outcome: Progressing

## 2023-07-02 LAB — BPAM RBC
Blood Product Expiration Date: 202501132359
Blood Product Expiration Date: 202501132359
Unit Type and Rh: 6200
Unit Type and Rh: 6200

## 2023-07-02 LAB — TYPE AND SCREEN
ABO/RH(D): A POS
Antibody Screen: NEGATIVE
Unit division: 0
Unit division: 0

## 2023-07-02 NOTE — Discharge Summary (Signed)
Physician Discharge Summary  Patient ID: Sarah Palmer MRN: [id number] DOB/AGE: 66-Aug-1957 66 y.o.  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  Dr. Bedelia Palmer performed the procedure.  [name]  [name]  He was ambulating and moving her bowels.  [name]  She was ready for discharge at this point.      Treatments: surgery: Exploratory laparotomy with resection of duplication cyst  Discharge Exam: Blood pressure (!) 160/85, pulse 68, temperature 97.9 F (36.6 C), temperature source Oral, resp. rate 18, height 5\' 6"  (1.676 m), weight 104.8 kg, last menstrual period 07/06/2008, SpO2 93%. General appearance: alert and cooperative Resp: clear to auscultation bilaterally Cardio: Normal sinus rhythm Incision/Wound: Incision clean dry intact  Disposition: Discharge disposition: 01-Home or Self Care       Discharge Instructions     Diet - low sodium heart healthy   Complete by: As directed    Increase activity slowly   Complete by: As directed       Allergies as of 07/02/2023       Reactions   Penicillins    Swelling- has not had since kid   Thiazide-type Diuretics Other (See Comments)   SEVERE electrolyte disturbance (potassium 2.2 and sodium 114).          Medication List     TAKE these medications    acetaminophen 500 MG tablet Commonly known as: TYLENOL Take 2 tablets (1,000 mg total) by mouth every 6 (six) hours. What changed:  how much to take when to take this reasons to take this   amLODipine 5 MG tablet Commonly known as: NORVASC Take 5 mg by mouth in the morning.   aspirin EC 81 MG tablet Take 81 mg by mouth daily. Swallow  whole.   docusate sodium 100 MG capsule Commonly known as: Colace Take 1 capsule (100 mg total) by mouth 2 (two) times daily.   fenofibrate 145 MG tablet Commonly known as: TRICOR TAKE 1 TABLET BY MOUTH EVERY DAY   ibuprofen 600 MG tablet Commonly known as: ADVIL Take 1 tablet (600 mg total) by mouth 4 (four) times daily.   methocarbamol 750 MG tablet Commonly known as: Robaxin-750 Take 1 tablet (750 mg total) by mouth 4 (four) times daily.   metoprolol succinate 100 MG 24 hr tablet Commonly known as: TOPROL-XL TAKE 1 TABLET BY MOUTH EVERY DAY WITH OR IMMEDIATELY FOLLOWING A MEAL   olmesartan 40 MG tablet Commonly known as: BENICAR TAKE 1 TABLET BY MOUTH EVERY DAY   oxyCODONE 5 MG immediate release tablet Commonly known as: Roxicodone Take 1 tablet (5 mg total) by mouth every 4 (four) hours as needed.   pantoprazole 20 MG tablet Commonly known as: PROTONIX Take 1 tablet (20 mg total) by mouth daily as needed. What changed: when to take this   traZODone 100 MG tablet Commonly known as: DESYREL TAKE 1.5-2 TABLETS (150-200 MG TOTAL) BY MOUTH AT BEDTIME.   VITAMIN E PO Take 1 capsule by mouth in the morning.        Follow-up Information     Sarah Monks, MD Follow up in 2 week(s).   Specialty: Surgery Why: For wound re-check Contact information: [address]  [facility] [place] [address] [phone]                 Signed: Clovis Pu Shenique Palmer 07/02/2023, 9:02 AM

## 2023-07-02 NOTE — Plan of Care (Signed)
  Problem: Education: Goal: Knowledge of General Education information will improve Description: Including pain rating scale, medication(s)/side effects and non-pharmacologic comfort measures Outcome: Progressing   Problem: Clinical Measurements: Goal: Ability to maintain clinical measurements within normal limits will improve Outcome: Progressing Goal: Will remain free from infection Outcome: Progressing Goal: Diagnostic test results will improve Outcome: Progressing Goal: Respiratory complications will improve Outcome: Progressing   Problem: Activity: Goal: Risk for activity intolerance will decrease Outcome: Progressing   Problem: Nutrition: Goal: Adequate nutrition will be maintained Outcome: Progressing   Problem: Coping: Goal: Level of anxiety will decrease Outcome: Progressing   Problem: Elimination: Goal: Will not experience complications related to bowel motility Outcome: Progressing Goal: Will not experience complications related to urinary retention Outcome: Progressing   Problem: Pain Management: Goal: General experience of comfort will improve Outcome: Progressing   Problem: Safety: Goal: Ability to remain free from injury will improve Outcome: Progressing   Problem: Skin Integrity: Goal: Risk for impaired skin integrity will decrease Outcome: Progressing

## 2023-07-02 NOTE — Progress Notes (Signed)
Physical Therapy Treatment Patient Details Name: Sarah Palmer MRN: [id number] DOB: Mar 13, 1956 Today's Date: 07/02/2023   History of Present Illness 67 y.o. female presents to [facility] hospital on 06/30/2023 for resection of cystic abdominal mass. PMH includes GERD, anxiety, alcohol abuse, HLD, HTN, melanoma.    PT Comments  Pt tolerated treatment well today. Pt today was able to ambulate in hallway and navigate stairs independently. No change in DC/DME recs at this time. Pt has met all acute PT goals and is DC from PT. Re consult PT if mobility status changes. Pt anticipates DC home today.    If plan is discharge home, recommend the following: A little help with bathing/dressing/bathroom;Assistance with cooking/housework;Help with stairs or ramp for entrance   Can travel by private vehicle        Equipment Recommendations  None recommended by PT    Recommendations for Other Services       Precautions / Restrictions Precautions Precautions: Fall Restrictions Weight Bearing Restrictions Per Provider Order: No     Mobility  Bed Mobility Overal bed mobility: Modified Independent Bed Mobility: Supine to Sit, Sit to Supine     Supine to sit: Modified independent (Device/Increase time) Sit to supine: Modified independent (Device/Increase time)        Transfers Overall transfer level: Independent Equipment used: None Transfers: Sit to/from Stand Sit to Stand: Independent                Ambulation/Gait Ambulation/Gait assistance: Independent Gait Distance (Feet): 100 Feet Assistive device: None Gait Pattern/deviations: Step-through pattern, Drifts right/left Gait velocity: reduced     General Gait Details: Pt noted to occasionally drift however otherwise stable.   Stairs Stairs: Yes Stairs assistance: Independent Stair Management: One rail Left, Alternating pattern, Forwards Number of Stairs: 3 General stair comments: No LOB noted.   Wheelchair Mobility      Tilt Bed    Modified Rankin (Stroke Patients Only)       Balance Overall balance assessment: No apparent balance deficits (not formally assessed)                                          Cognition Arousal: Alert Behavior During Therapy: WFL for tasks assessed/performed Overall Cognitive Status: Within Functional Limits for tasks assessed                                          Exercises      General Comments General comments (skin integrity, edema, etc.): VSS      Pertinent Vitals/Pain Pain Assessment Pain Assessment: No/denies pain    Home Living                          Prior Function            PT Goals (current goals can now be found in the care plan section) Progress towards PT goals: Goals met/education completed, patient discharged from PT    Frequency    Min 1X/week      PT Plan      Co-evaluation              AM-PAC PT "6 Clicks" Mobility   Outcome Measure  Help needed turning from your back to your side while in a  flat bed without using bedrails?: None Help needed moving from lying on your back to sitting on the side of a flat bed without using bedrails?: None Help needed moving to and from a bed to a chair (including a wheelchair)?: None Help needed standing up from a chair using your arms (e.g., wheelchair or bedside chair)?: None Help needed to walk in hospital room?: None Help needed climbing 3-5 steps with a railing? : None 6 Click Score: 24    End of Session Equipment Utilized During Treatment: Gait belt Activity Tolerance: Patient tolerated treatment well Patient left: in chair;with call bell/phone within reach Nurse Communication: Mobility status PT Visit Diagnosis: Other abnormalities of gait and mobility (R26.89)     Time: 3474-2595 PT Time Calculation (min) (ACUTE ONLY): 8 min  Charges:    $Gait Training: 8-22 mins PT General Charges $$ ACUTE PT VISIT: 1  Visit                     Shela Nevin, PT, DPT Acute Rehab Services [phone]    Gladys Damme 07/02/2023, 11:24 AM

## 2023-07-02 NOTE — Progress Notes (Signed)
Discharge instructions were given to the patient. All questions are answered. Per pt her ride home will not be here till 4 pm. IV is still intact. Will d/c right before discharge.

## 2023-07-04 LAB — SURGICAL PATHOLOGY

## 2023-07-04 LAB — CYTOLOGY - NON PAP

## 2023-08-14 ENCOUNTER — Encounter: Payer: Self-pay | Admitting: Family Medicine

## 2023-08-14 DIAGNOSIS — N958 Other specified menopausal and perimenopausal disorders: Secondary | ICD-10-CM | POA: Diagnosis not present

## 2023-08-14 DIAGNOSIS — Z1231 Encounter for screening mammogram for malignant neoplasm of breast: Secondary | ICD-10-CM | POA: Diagnosis not present

## 2023-08-14 DIAGNOSIS — M8588 Other specified disorders of bone density and structure, other site: Secondary | ICD-10-CM | POA: Diagnosis not present

## 2023-08-14 DIAGNOSIS — E2839 Other primary ovarian failure: Secondary | ICD-10-CM | POA: Diagnosis not present

## 2023-08-14 LAB — HM DEXA SCAN

## 2023-08-14 LAB — HM MAMMOGRAPHY

## 2023-08-28 ENCOUNTER — Other Ambulatory Visit: Payer: Self-pay | Admitting: Family Medicine

## 2023-08-28 DIAGNOSIS — I10 Essential (primary) hypertension: Secondary | ICD-10-CM

## 2023-08-28 NOTE — Telephone Encounter (Signed)
 This is in historical

## 2023-09-12 ENCOUNTER — Encounter: Payer: Self-pay | Admitting: Family Medicine

## 2023-09-12 DIAGNOSIS — M8580 Other specified disorders of bone density and structure, unspecified site: Secondary | ICD-10-CM | POA: Insufficient documentation

## 2023-09-12 NOTE — Progress Notes (Signed)
 See mychart note.  [facility]: lowest T = -1.2 left femoral neck. Recheck 3 years [name], Thank you for getting your bone density screening test done. I have reviewed the results. Your bone test shows that you have mildly low bone mass or osteopenia. This condition responds well to regular exercise and getting enough calcium and vit d in the diet or with daily supplements. We can recheck again in 3 years.   Sincerely, Dr. Mardelle Matte

## 2023-09-17 ENCOUNTER — Other Ambulatory Visit: Payer: Self-pay | Admitting: Family Medicine

## 2023-11-27 ENCOUNTER — Other Ambulatory Visit: Payer: Self-pay | Admitting: Family

## 2023-11-30 ENCOUNTER — Other Ambulatory Visit: Payer: Self-pay | Admitting: Family Medicine

## 2023-11-30 MED ORDER — TRAZODONE HCL 100 MG PO TABS: 150.0000 mg | ORAL_TABLET | Freq: Every day | ORAL | 3 refills | Status: AC

## 2023-11-30 NOTE — Telephone Encounter (Signed)
 Copied from CRM [id number]. Topic: Clinical - Medication Refill >> Nov 30, 2023 12:14 PM [place] J wrote: Medication: traZODone  (DESYREL ) 100 MG  Has the patient contacted their pharmacy? Yes (Agent: If no, request that the patient contact the pharmacy for the refill. If patient does not wish to contact the pharmacy document the reason why and proceed with request.) (Agent: If yes, when and what did the pharmacy advise?) Pharmacy has been trying to send in the request on their end but has received no update from us . This is the patient's preferred pharmacy:   [id number] - [place] [place] - [address] AT [facility] [address] [place] [address] Phone: [phone] Fax: [phone]  Is this the correct pharmacy for this prescription? Yes If no, delete pharmacy and type the correct one.   Has the prescription been filled recently? No  Is the patient out of the medication? Yes  Has the patient been seen for an appointment in the last year OR does the patient have an upcoming appointment? Yes  Can we respond through MyChart? Yes  Agent: Please be advised that Rx refills may take up to 3 business days. We ask that you follow-up with your pharmacy.

## 2023-12-19 ENCOUNTER — Ambulatory Visit: Admitting: Family Medicine

## 2023-12-27 ENCOUNTER — Ambulatory Visit: Payer: BC Managed Care – PPO | Admitting: Family Medicine

## 2024-01-04 ENCOUNTER — Ambulatory Visit: Admitting: Family Medicine

## 2024-01-15 ENCOUNTER — Ambulatory Visit (INDEPENDENT_AMBULATORY_CARE_PROVIDER_SITE_OTHER): Payer: Self-pay | Admitting: Family Medicine

## 2024-01-15 VITALS — BP 122/74 | HR 68 | Temp 98.1°F | Wt 238.6 lb

## 2024-01-15 DIAGNOSIS — D126 Benign neoplasm of colon, unspecified: Secondary | ICD-10-CM

## 2024-01-15 DIAGNOSIS — Z0001 Encounter for general adult medical examination with abnormal findings: Secondary | ICD-10-CM

## 2024-01-15 DIAGNOSIS — K219 Gastro-esophageal reflux disease without esophagitis: Secondary | ICD-10-CM

## 2024-01-15 DIAGNOSIS — K668 Other specified disorders of peritoneum: Secondary | ICD-10-CM

## 2024-01-15 DIAGNOSIS — F5101 Primary insomnia: Secondary | ICD-10-CM

## 2024-01-15 DIAGNOSIS — M8580 Other specified disorders of bone density and structure, unspecified site: Secondary | ICD-10-CM

## 2024-01-15 DIAGNOSIS — I10 Essential (primary) hypertension: Secondary | ICD-10-CM

## 2024-01-15 DIAGNOSIS — Z780 Asymptomatic menopausal state: Secondary | ICD-10-CM

## 2024-01-15 DIAGNOSIS — D72829 Elevated white blood cell count, unspecified: Secondary | ICD-10-CM

## 2024-01-15 DIAGNOSIS — E781 Pure hyperglyceridemia: Secondary | ICD-10-CM

## 2024-01-15 DIAGNOSIS — Z0000 Encounter for general adult medical examination without abnormal findings: Secondary | ICD-10-CM

## 2024-01-15 MED ORDER — PANTOPRAZOLE SODIUM 20 MG PO TBEC: 20.0000 mg | DELAYED_RELEASE_TABLET | Freq: Every day | ORAL | 3 refills | Status: AC

## 2024-01-15 NOTE — Progress Notes (Signed)
 Subjective  Chief Complaint  Patient presents with   Hypertension    HPI: Sarah Palmer is a 68 y.o. female who presents to [facility] [place] today for a Female Wellness Visit. She also has the concerns and/or needs as listed above in the chief complaint. These will be addressed in addition to the Health Maintenance Visit.   Wellness Visit: annual visit with health maintenance review and exam  HM: mammo is current. She has retired and is doing well overall. Dexa is up to date: osteopenia. CRC screen is current. Imms are up to date.  Chronic disease f/u and/or acute problem visit: (deemed necessary to be done in addition to the wellness visit): HTN w/ white coat response: on amlodipine  olmesartan  and toprol  xl; home readings continue to show good control. Avg 120s/70s. She feels well w/o cp or sob. No edema.  S/p exp lab and removal of large mesenteric duplication cyst from abdomen in December. Has finally recovered. I reviewed notes. Benign pathology GERD on PPI> needs refill. Working well.  Hypertrigs on fenofibrate . Not fasting today so will come back for lab work. Tolerates meds. Diet needs improvement.  Insomnia, chronic. Fairly well controlled with trazadone.   Assessment  1. Encounter for well adult exam with abnormal findings   2. White coat syndrome with diagnosis of hypertension   3. Tubular adenoma of colon   4. Leukocytosis, unspecified type   5. Osteopenia after menopause   6. Mesenteric cyst   7. Gastroesophageal reflux disease without esophagitis   8. Hypertriglyceridemia   9. Primary insomnia      Plan  Female Wellness Visit: Age appropriate Health Maintenance and Prevention measures were discussed with patient. Included topics are cancer screening recommendations, ways to keep healthy (see AVS) including dietary and exercise recommendations, regular eye and dental care, use of seat belts, and avoidance of moderate alcohol use and tobacco use.   BMI: discussed patient's BMI and encouraged positive lifestyle modifications to help get to or maintain a target BMI. HM needs and immunizations were addressed and ordered. See below for orders. See HM and immunization section for updates. Routine labs and screening tests ordered including cmp, cbc and lipids where appropriate. Discussed recommendations regarding Vit D and calcium supplementation (see AVS)  Chronic disease management visit and/or acute problem visit: HTN: well controlled by home readings. Continue current medications.  Refilled PPI for GERD, well controlled.  Osteopenia: rec walking and weight bearing exercise. Ca and vit d. Recheck 2 years.  Tubular adenoma: colonoscopy 2024; repeat in 7 years Monitoring wbc  Follow up: 6 mo for htn recheck  Orders Placed This Encounter  Procedures   CBC with Differential/Platelet   Comprehensive metabolic panel with GFR   Lipid panel   TSH   Meds ordered this encounter  Medications   pantoprazole  (PROTONIX ) 20 MG tablet    Sig: Take 1 tablet (20 mg total) by mouth daily.    Dispense:  90 tablet    Refill:  3      Body mass index is 38.51 kg/m. Wt Readings from Last 3 Encounters:  01/15/24 238 lb 9.6 oz (108.2 kg)  06/30/23 231 lb (104.8 kg)  06/28/23 231 lb (104.8 kg)     Patient Active Problem List   Diagnosis Date Noted   Tubular adenoma of colon 06/28/2023    Priority: High    Colonoscopy done 04/2023 due to abdominal mass on ct: tubular adenoma and rec repeat in 7 years.  Insomnia 08/12/2016    Priority: High   White coat syndrome with diagnosis of hypertension     Priority: High    Neg eval for pheo; electrolyte disturbances on thiazide diuretic: severe hyponatremia and hypokalemia: seizures. 2015  Toprol  xl 100, amlodipine  5 and olmesartan  40    History of melanoma     Priority: High    resected from lower extremity    Osteopenia after menopause 09/12/2023    Priority: Medium     DEXA [facility]  09/2023: lowest T = -1.2 left femoral neck, routine guidance and recheck 2-3 years.     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  Path: mucinous epithelial cyst    Hypertriglyceridemia 04/15/2021    Priority: Medium    Leukocytosis 07/06/2018    Priority: Medium     Negative eval by hematology 2018; former smoker    Class 2 severe obesity due to excess calories with serious comorbidity and body mass index (BMI) of 37.0 to 37.9 in adult [facility])     Priority: Medium    [place] 09/01/2016    Priority: Medium     Evaluated by Dr. Kassie 2018 for pheochromocytoma; urine and initial lab tests were normal. To repeat testing if symptoms recur.     Gastroesophageal reflux disease 08/13/2016    Priority: Medium    History of alcohol abuse     Priority: Medium     Patient reports being in recovery for years and associated with a formal treatment program.    Atypical chest pain     Priority: Low    +Palpitations; negative chest CT in 2009; associated with URI in 06/2012; CT reviewed with radiologist-minimal, if any, coronary/aortic calcification.    Screening for colorectal cancer 12/12/2018    + cologuard with normal f/u colonoscopy 07/2018. Repeat in 10 years for routine screening. No more cologuards since 1st was abnormal Colonoscopy done 04/2023 due to abdominal mass on ct: tubular adenoma and rec repeat in 7 years.    Health Maintenance  Topic Date Due   COVID-19 Vaccine (7 - 2024-25 season) 09/15/2023   INFLUENZA VACCINE  02/09/2024   MAMMOGRAM  08/13/2024   DEXA SCAN  08/13/2026   Colonoscopy  04/10/2030   DTaP/Tdap/Td (2 - Td or Tdap) 12/29/2032   Pneumococcal Vaccine: 50+ Years  Completed   Hepatitis C Screening  Completed   Zoster Vaccines- Shingrix   Completed   Hepatitis B Vaccines  Aged Out   HPV VACCINES  Aged Out   Meningococcal B Vaccine  Aged Out   Immunization History   Administered Date(s) Administered   Influenza, High Dose Seasonal PF 04/30/2022, 03/17/2023   Influenza,inj,Quad PF,6+ Mos 07/06/2018, 05/29/2019, 05/14/2020, 03/19/2021   Moderna Covid-19 Fall Seasonal Vaccine 14yrs & older 03/18/2023   Moderna Covid-19 Vaccine Bivalent Booster 57yrs & up 06/06/2021, 05/01/2022   Moderna Sars-Covid-2 Vaccination 09/19/2019, 10/10/2019, 05/14/2020   PNEUMOCOCCAL CONJUGATE-20 05/20/2022   Tdap 12/30/2022   Zoster Recombinant(Shingrix ) 02/23/2019, 06/15/2019   We updated and reviewed the patient's past history in detail and it is documented below. Allergies: Patient is allergic to penicillins and thiazide-type diuretics. Past Medical History Patient  has a past medical history of Acid reflux, Allergy, Anxiety, Arthritis, Chest pain (2009), History of alcohol abuse (2009), History of seizure - electrolyte abnormality (09/26/2013), Hyperlipidemia, Hypertension, Insomnia, Leukocytosis (07/06/2018), Melanoma (HCC) (1999), Substance abuse (HCC), and Tobacco abuse. Past Surgical History Patient  has  a past surgical history that includes Melanoma excision (1999); Middle ear surgery (2017); Colonoscopy; Tonsillectomy; Dental surgery; laparotomy (N/A, 06/30/2023); and Resection of abdominal mass (N/A, 06/30/2023). Family History: Patient family history includes COPD in her father; Colon polyps in her mother; Healthy in her mother, sister, and sister; Hypertension in an other family member; Obesity in her sister. Social History:  Patient  reports that she quit smoking about 10 years ago. Her smoking use included cigarettes. She started smoking about 50 years ago. She has a 10 pack-year smoking history. She has never used smokeless tobacco. She reports that she does not drink alcohol and does not use drugs.  Review of Systems: Constitutional: negative for fever or malaise Ophthalmic: negative for photophobia, double vision or loss of vision Cardiovascular: negative for  chest pain, dyspnea on exertion, or new LE swelling Respiratory: negative for SOB or persistent cough Gastrointestinal: negative for abdominal pain, change in bowel habits or melena Genitourinary: negative for dysuria or gross hematuria, no abnormal uterine bleeding or disharge Musculoskeletal: negative for new gait disturbance or muscular weakness Integumentary: negative for new or persistent rashes, no breast lumps Neurological: negative for TIA or stroke symptoms Psychiatric: negative for SI or delusions Allergic/Immunologic: negative for hives  Patient Care Team    Relationship Specialty Notifications Start End  Jodie Lavern CROME, MD PCP - General Family Medicine  07/06/18   Juventino Lamar HERO, MD Attending Physician Cardiology  07/06/12   Shila Gustav GAILS, MD Consulting Physician Gastroenterology  01/23/19   Paola Dreama SAILOR, MD Consulting Physician Surgery  06/28/23     Objective  Vitals: BP 122/74 Comment: by consistent home readings  Pulse 68   Temp 98.1 F (36.7 C)   Wt 238 lb 9.6 oz (108.2 kg)   LMP 07/06/2008 (Approximate)   SpO2 95%   BMI 38.51 kg/m  General:  Well developed, well nourished, no acute distress  Psych:  Alert and orientedx3,normal mood and affect HEENT:  Normocephalic, atraumatic, non-icteric sclera,  supple neck without adenopathy, mass or thyromegaly Cardiovascular:  Normal S1, S2, RRR without gallop, rub or murmur Respiratory:  Good breath sounds bilaterally, CTAB with normal respiratory effort Gastrointestinal: normal bowel sounds, soft, non-tender, no noted masses. No HSM, well healed  vertical scar MSK: extremities without edema, joints without erythema , tr ankle swelling Neurologic:    Mental status is normal.  Gross motor and sensory exams are normal.  No tremor  Commons side effects, risks, benefits, and alternatives for medications and treatment plan prescribed today were discussed, and the patient expressed understanding of the given  instructions. Patient is instructed to call or message via MyChart if he/she has any questions or concerns regarding our treatment plan. No barriers to understanding were identified. We discussed Red Flag symptoms and signs in detail. Patient expressed understanding regarding what to do in case of urgent or emergency type symptoms.  Medication list was reconciled, printed and provided to the patient in AVS. Patient instructions and summary information was reviewed with the patient as documented in the AVS. This note was prepared with assistance of Dragon voice recognition software. Occasional wrong-word or sound-a-like substitutions may have occurred due to the inherent limitations of voice recognition software

## 2024-01-22 ENCOUNTER — Other Ambulatory Visit: Payer: Self-pay

## 2024-02-12 ENCOUNTER — Other Ambulatory Visit (INDEPENDENT_AMBULATORY_CARE_PROVIDER_SITE_OTHER): Payer: Self-pay

## 2024-02-12 DIAGNOSIS — D72829 Elevated white blood cell count, unspecified: Secondary | ICD-10-CM | POA: Diagnosis not present

## 2024-02-12 DIAGNOSIS — I10 Essential (primary) hypertension: Secondary | ICD-10-CM | POA: Diagnosis not present

## 2024-02-12 DIAGNOSIS — Z0001 Encounter for general adult medical examination with abnormal findings: Secondary | ICD-10-CM | POA: Diagnosis not present

## 2024-02-12 LAB — COMPREHENSIVE METABOLIC PANEL WITH GFR
ALT: 7 U/L (ref 0–35)
AST: 13 U/L (ref 0–37)
Albumin: 4.2 g/dL (ref 3.5–5.2)
Alkaline Phosphatase: 58 U/L (ref 39–117)
BUN: 15 mg/dL (ref 6–23)
CO2: 25 meq/L (ref 19–32)
Calcium: 9.3 mg/dL (ref 8.4–10.5)
Chloride: 104 meq/L (ref 96–112)
Creatinine, Ser: 0.73 mg/dL (ref 0.40–1.20)
GFR: 85.25 mL/min (ref >60.00)
Glucose, Bld: 106 mg/dL — ABNORMAL HIGH (ref 70–99)
Potassium: 3.9 meq/L (ref 3.5–5.1)
Sodium: 140 meq/L (ref 135–145)
Total Bilirubin: 0.4 mg/dL (ref 0.2–1.2)
Total Protein: 7 g/dL (ref 6.0–8.3)

## 2024-02-12 LAB — CBC WITH DIFFERENTIAL/PLATELET
Basophils Absolute: 0.1 K/uL (ref 0.0–0.1)
Basophils Relative: 0.9 % (ref 0.0–3.0)
Eosinophils Absolute: 0.2 K/uL (ref 0.0–0.7)
Eosinophils Relative: 2.4 % (ref 0.0–5.0)
HCT: 45.9 % (ref 36.0–46.0)
Hemoglobin: 15 g/dL (ref 12.0–15.0)
Lymphocytes Relative: 25.3 % (ref 12.0–46.0)
Lymphs Abs: 1.9 K/uL (ref 0.7–4.0)
MCHC: 32.6 g/dL (ref 30.0–36.0)
MCV: 84.4 fl (ref 78.0–100.0)
Monocytes Absolute: 0.7 K/uL (ref 0.1–1.0)
Monocytes Relative: 8.9 % (ref 3.0–12.0)
Neutro Abs: 4.7 K/uL (ref 1.4–7.7)
Neutrophils Relative %: 62.5 % (ref 43.0–77.0)
Platelets: 302 K/uL (ref 150.0–400.0)
RBC: 5.44 Mil/uL — ABNORMAL HIGH (ref 3.87–5.11)
RDW: 16.7 % — ABNORMAL HIGH (ref 11.5–15.5)
WBC: 7.5 K/uL (ref 4.0–10.5)

## 2024-02-12 LAB — LIPID PANEL
Cholesterol: 209 mg/dL — ABNORMAL HIGH (ref 0–200)
HDL: 80.4 mg/dL (ref >39.00)
LDL Cholesterol: 103 mg/dL — ABNORMAL HIGH (ref 0–99)
NonHDL: 128.87
Total CHOL/HDL Ratio: 3
Triglycerides: 127 mg/dL (ref 0.0–149.0)
VLDL: 25.4 mg/dL (ref 0.0–40.0)

## 2024-02-12 LAB — TSH: TSH: 1.1 u[IU]/mL (ref 0.35–5.50)

## 2024-02-21 ENCOUNTER — Ambulatory Visit: Payer: Self-pay | Admitting: Family Medicine

## 2024-02-21 NOTE — Progress Notes (Signed)
 See mychart note will monitor cholesterol. Can consider statin. On fenofibrate .  The 10-year ASCVD risk score (Arnett DK, et al., 2019) is: 7.6%   Values used to calculate the score:     Age: 68 years     Clincally relevant sex: Female     Is Non-Hispanic African American: No     Diabetic: No     Tobacco smoker: No     Systolic Blood Pressure: 122 mmHg     Is BP treated: Yes     HDL Cholesterol: 80.4 mg/dL     Total Cholesterol: 209 mg/dL

## 2024-02-26 ENCOUNTER — Other Ambulatory Visit: Payer: Self-pay | Admitting: Family Medicine

## 2024-02-26 DIAGNOSIS — I10 Essential (primary) hypertension: Secondary | ICD-10-CM

## 2024-04-12 ENCOUNTER — Telehealth: Payer: Self-pay

## 2024-04-12 NOTE — Telephone Encounter (Signed)
 Copied from CRM #[id number]. Topic: Clinical - Medication Question >> Apr 12, 2024  9:20 AM Viola F wrote: Patient says her amLODipine  (NORVASC ) 5 MG tablet was denied, she thinks it was denied due to her having the increase to 10mg  - she wants to know if new script for 10mg  can be sent to pharmacy on file. She also wants to know if she can push up refill for the traZODone  (DESYREL ) 100 MG tablet to this month due to her moving 05/10/24. Please call her at [phone] (M)  Please Advise.  Message sent to provider to address

## 2024-04-15 ENCOUNTER — Other Ambulatory Visit: Payer: Self-pay

## 2024-04-15 DIAGNOSIS — I10 Essential (primary) hypertension: Secondary | ICD-10-CM

## 2024-04-15 MED ORDER — AMLODIPINE BESYLATE 5 MG PO TABS
5.0000 mg | ORAL_TABLET | Freq: Every day | ORAL | 3 refills | Status: AC
Start: 2024-04-15 — End: ?

## 2024-04-16 ENCOUNTER — Other Ambulatory Visit: Payer: Self-pay | Admitting: Family Medicine

## 2024-05-06 ENCOUNTER — Telehealth: Payer: Self-pay

## 2024-05-06 NOTE — Telephone Encounter (Signed)
 Copied from CRM [id number]. Topic: General - Other >> May 06, 2024  2:54 PM Pinkey ORN wrote: Reason for CRM: Pharmacy Change >> May 06, 2024  2:55 PM Pinkey ORN wrote: Patient is requesting to have her pharmacy changed over to   [facility] [address] [address]    Pharmacy has been updated

## 2024-07-10 ENCOUNTER — Telehealth: Payer: Self-pay

## 2024-07-10 NOTE — Telephone Encounter (Signed)
 Copied from CRM #[id number]. Topic: General - Other >> Jul 09, 2024 12:19 PM Hadassah PARAS wrote: Reason for CRM: Pt called in to change preffered pharmacy. Pt has also moved and wanting to see if PCP had any recommedations on a new PCP out close to [facility]. Please advise on #[phone]

## 2024-08-14 ENCOUNTER — Telehealth: Payer: Self-pay

## 2024-08-14 MED ORDER — OLMESARTAN MEDOXOMIL 40 MG PO TABS: 40.0000 mg | ORAL_TABLET | Freq: Every day | ORAL | 1 refills | Status: AC

## 2024-08-14 NOTE — Telephone Encounter (Signed)
 Please advise on if refill can be completed. If agreeable medications have been pended. The last note I see was that patient was looking for new PCP due to move. Please and thank you.   Copied from CRM [id number]. Topic: Clinical - Medication Refill >> Aug 14, 2024  2:03 PM Brittany M wrote: Medication: olmesartan  (BENICAR ) 40 MG tablet  Has the patient contacted their pharmacy? Yes (Agent: If no, request that the patient contact the pharmacy for the refill. If patient does not wish to contact the pharmacy document the reason why and proceed with request.) (Agent: If yes, when and what did the pharmacy advise?)  This is the patient's preferred pharmacy:  [id number] - [place] [place] - [address] AT [facility] [address] [place] [address] Phone: [phone] Fax: [phone]  Is this the correct pharmacy for this prescription? Yes If no, delete pharmacy and type the correct one.   Has the prescription been filled recently? Yes  Is the patient out of the medication? Yes  Has the patient been seen for an appointment in the last year OR does the patient have an upcoming appointment? Yes  Can we respond through MyChart? Yes  Agent: Please be advised that Rx refills may take up to 3 business days. We ask that you follow-up with your pharmacy.

## 2025-01-16 ENCOUNTER — Encounter: Payer: Self-pay | Admitting: Family Medicine
